# Patient Record
Sex: Male | Born: 1935 | Race: White | Hispanic: No | Marital: Married | Smoking: Never smoker
Health system: Southern US, Community
[De-identification: ages and names within clinical notes are randomized; demographics above are authoritative.]

## PROBLEM LIST (undated history)

## (undated) DIAGNOSIS — K219 Gastro-esophageal reflux disease without esophagitis: Secondary | ICD-10-CM

## (undated) DIAGNOSIS — C4492 Squamous cell carcinoma of skin, unspecified: Secondary | ICD-10-CM

## (undated) DIAGNOSIS — H409 Unspecified glaucoma: Secondary | ICD-10-CM

## (undated) DIAGNOSIS — F419 Anxiety disorder, unspecified: Secondary | ICD-10-CM

## (undated) DIAGNOSIS — I10 Essential (primary) hypertension: Secondary | ICD-10-CM

## (undated) DIAGNOSIS — G5600 Carpal tunnel syndrome, unspecified upper limb: Secondary | ICD-10-CM

## (undated) DIAGNOSIS — R238 Other skin changes: Secondary | ICD-10-CM

## (undated) DIAGNOSIS — E785 Hyperlipidemia, unspecified: Secondary | ICD-10-CM

## (undated) DIAGNOSIS — N3010 Interstitial cystitis (chronic) without hematuria: Secondary | ICD-10-CM

## (undated) DIAGNOSIS — D126 Benign neoplasm of colon, unspecified: Secondary | ICD-10-CM

## (undated) DIAGNOSIS — N400 Enlarged prostate without lower urinary tract symptoms: Secondary | ICD-10-CM

## (undated) DIAGNOSIS — M1990 Unspecified osteoarthritis, unspecified site: Secondary | ICD-10-CM

## (undated) HISTORY — DX: Benign prostatic hyperplasia without lower urinary tract symptoms: N40.0

## (undated) HISTORY — PX: UPPER GASTROINTESTINAL ENDOSCOPY: SHX188

## (undated) HISTORY — DX: Anxiety disorder, unspecified: F41.9

## (undated) HISTORY — DX: Hyperlipidemia, unspecified: E78.5

## (undated) HISTORY — PX: APPENDECTOMY: SHX54

## (undated) HISTORY — DX: Unspecified osteoarthritis, unspecified site: M19.90

## (undated) HISTORY — DX: Other skin changes: R23.8

## (undated) HISTORY — PX: TRANSURETHRAL RESECTION OF PROSTATE: SHX73

## (undated) HISTORY — DX: Squamous cell carcinoma of skin, unspecified: C44.92

## (undated) HISTORY — DX: Unspecified glaucoma: H40.9

## (undated) HISTORY — PX: POLYPECTOMY: SHX149

## (undated) HISTORY — PX: COLONOSCOPY: SHX174

## (undated) HISTORY — DX: Interstitial cystitis (chronic) without hematuria: N30.10

## (undated) HISTORY — DX: Essential (primary) hypertension: I10

## (undated) HISTORY — DX: Benign neoplasm of colon, unspecified: D12.6

## (undated) HISTORY — DX: Carpal tunnel syndrome, unspecified upper limb: G56.00

---

## 1998-12-01 ENCOUNTER — Observation Stay (HOSPITAL_COMMUNITY): Admission: EM | Admit: 1998-12-01 | Discharge: 1998-12-01 | Payer: Self-pay | Admitting: Emergency Medicine

## 1998-12-01 ENCOUNTER — Encounter: Payer: Self-pay | Admitting: Internal Medicine

## 2000-02-26 ENCOUNTER — Other Ambulatory Visit: Admission: RE | Admit: 2000-02-26 | Discharge: 2000-02-26 | Payer: Self-pay | Admitting: Gastroenterology

## 2000-02-26 ENCOUNTER — Encounter (INDEPENDENT_AMBULATORY_CARE_PROVIDER_SITE_OTHER): Payer: Self-pay | Admitting: Specialist

## 2003-05-19 ENCOUNTER — Encounter (INDEPENDENT_AMBULATORY_CARE_PROVIDER_SITE_OTHER): Payer: Self-pay | Admitting: *Deleted

## 2003-05-19 ENCOUNTER — Ambulatory Visit (HOSPITAL_BASED_OUTPATIENT_CLINIC_OR_DEPARTMENT_OTHER): Admission: RE | Admit: 2003-05-19 | Discharge: 2003-05-19 | Payer: Self-pay | Admitting: Urology

## 2004-12-10 ENCOUNTER — Ambulatory Visit: Payer: Self-pay | Admitting: Internal Medicine

## 2005-01-02 ENCOUNTER — Ambulatory Visit: Payer: Self-pay | Admitting: Internal Medicine

## 2005-01-31 ENCOUNTER — Ambulatory Visit (HOSPITAL_COMMUNITY): Admission: RE | Admit: 2005-01-31 | Discharge: 2005-01-31 | Payer: Self-pay | Admitting: Urology

## 2005-01-31 ENCOUNTER — Ambulatory Visit (HOSPITAL_BASED_OUTPATIENT_CLINIC_OR_DEPARTMENT_OTHER): Admission: RE | Admit: 2005-01-31 | Discharge: 2005-01-31 | Payer: Self-pay | Admitting: Urology

## 2005-02-11 ENCOUNTER — Ambulatory Visit: Payer: Self-pay | Admitting: Internal Medicine

## 2005-03-18 ENCOUNTER — Ambulatory Visit: Payer: Self-pay | Admitting: Internal Medicine

## 2005-05-09 ENCOUNTER — Ambulatory Visit: Payer: Self-pay | Admitting: Internal Medicine

## 2005-06-10 ENCOUNTER — Ambulatory Visit: Payer: Self-pay | Admitting: Internal Medicine

## 2005-06-13 ENCOUNTER — Ambulatory Visit: Payer: Self-pay | Admitting: Cardiology

## 2005-06-19 ENCOUNTER — Ambulatory Visit: Payer: Self-pay | Admitting: Internal Medicine

## 2005-08-21 ENCOUNTER — Ambulatory Visit: Payer: Self-pay | Admitting: Internal Medicine

## 2005-09-24 ENCOUNTER — Ambulatory Visit: Payer: Self-pay | Admitting: Internal Medicine

## 2005-10-02 ENCOUNTER — Ambulatory Visit: Payer: Self-pay | Admitting: Internal Medicine

## 2005-10-24 DIAGNOSIS — D126 Benign neoplasm of colon, unspecified: Secondary | ICD-10-CM

## 2005-10-24 HISTORY — DX: Benign neoplasm of colon, unspecified: D12.6

## 2005-10-27 ENCOUNTER — Ambulatory Visit: Payer: Self-pay | Admitting: Gastroenterology

## 2005-11-07 ENCOUNTER — Ambulatory Visit: Payer: Self-pay | Admitting: Gastroenterology

## 2005-11-07 ENCOUNTER — Encounter (INDEPENDENT_AMBULATORY_CARE_PROVIDER_SITE_OTHER): Payer: Self-pay | Admitting: Specialist

## 2005-11-07 LAB — HM COLONOSCOPY

## 2005-11-21 ENCOUNTER — Ambulatory Visit: Payer: Self-pay | Admitting: Cardiology

## 2006-01-01 ENCOUNTER — Ambulatory Visit: Payer: Self-pay | Admitting: Internal Medicine

## 2006-03-31 ENCOUNTER — Ambulatory Visit: Payer: Self-pay | Admitting: Internal Medicine

## 2006-06-08 ENCOUNTER — Ambulatory Visit: Payer: Self-pay | Admitting: Internal Medicine

## 2006-08-04 ENCOUNTER — Ambulatory Visit: Payer: Self-pay | Admitting: Internal Medicine

## 2006-10-05 ENCOUNTER — Ambulatory Visit: Payer: Self-pay | Admitting: Internal Medicine

## 2006-10-05 LAB — CONVERTED CEMR LAB
ALT: 26 units/L (ref 0–40)
AST: 24 units/L (ref 0–37)
Albumin: 3.7 g/dL (ref 3.5–5.2)
Alkaline Phosphatase: 51 units/L (ref 39–117)
BUN: 15 mg/dL (ref 6–23)
Basophils Absolute: 0 10*3/uL (ref 0.0–0.1)
Basophils Relative: 0.3 % (ref 0.0–1.0)
CO2: 31 meq/L (ref 19–32)
Calcium: 9.2 mg/dL (ref 8.4–10.5)
Chloride: 105 meq/L (ref 96–112)
Chol/HDL Ratio, serum: 3.9
Cholesterol: 166 mg/dL (ref 0–200)
Creatinine, Ser: 1.3 mg/dL (ref 0.4–1.5)
Eosinophil percent: 3.4 % (ref 0.0–5.0)
GFR calc non Af Amer: 58 mL/min
Glomerular Filtration Rate, Af Am: 70 mL/min/{1.73_m2}
Glucose, Bld: 101 mg/dL — ABNORMAL HIGH (ref 70–99)
HCT: 48.2 % (ref 39.0–52.0)
HDL: 42.4 mg/dL (ref >39.0)
Hemoglobin: 16.1 g/dL (ref 13.0–17.0)
LDL Cholesterol: 113 mg/dL — ABNORMAL HIGH (ref 0–99)
Lymphocytes Relative: 33.3 % (ref 12.0–46.0)
MCHC: 33.4 g/dL (ref 30.0–36.0)
MCV: 93.7 fL (ref 78.0–100.0)
Monocytes Absolute: 0.5 10*3/uL (ref 0.2–0.7)
Monocytes Relative: 7.1 % (ref 3.0–11.0)
Neutro Abs: 3.6 10*3/uL (ref 1.4–7.7)
Neutrophils Relative %: 55.9 % (ref 43.0–77.0)
PSA: 0.51 ng/mL (ref 0.10–4.00)
Platelets: 186 10*3/uL (ref 150–400)
Potassium: 4.9 meq/L (ref 3.5–5.1)
RBC: 5.14 M/uL (ref 4.22–5.81)
RDW: 12.4 % (ref 11.5–14.6)
Sodium: 141 meq/L (ref 135–145)
TSH: 1.15 microintl units/mL (ref 0.35–5.50)
Total Bilirubin: 1 mg/dL (ref 0.3–1.2)
Total Protein: 7.1 g/dL (ref 6.0–8.3)
Triglyceride fasting, serum: 55 mg/dL (ref 0–149)
VLDL: 11 mg/dL (ref 0–40)
WBC: 6.4 10*3/uL (ref 4.5–10.5)

## 2006-10-16 ENCOUNTER — Ambulatory Visit: Payer: Self-pay | Admitting: Internal Medicine

## 2007-01-12 ENCOUNTER — Ambulatory Visit (HOSPITAL_COMMUNITY): Admission: RE | Admit: 2007-01-12 | Discharge: 2007-01-12 | Payer: Self-pay | Admitting: Urology

## 2007-02-25 ENCOUNTER — Ambulatory Visit: Payer: Self-pay | Admitting: Internal Medicine

## 2007-05-10 ENCOUNTER — Ambulatory Visit: Payer: Self-pay | Admitting: Internal Medicine

## 2007-09-28 ENCOUNTER — Ambulatory Visit: Payer: Self-pay | Admitting: Internal Medicine

## 2007-09-28 DIAGNOSIS — M1990 Unspecified osteoarthritis, unspecified site: Secondary | ICD-10-CM | POA: Insufficient documentation

## 2007-09-28 DIAGNOSIS — J309 Allergic rhinitis, unspecified: Secondary | ICD-10-CM

## 2007-09-28 DIAGNOSIS — I10 Essential (primary) hypertension: Secondary | ICD-10-CM | POA: Insufficient documentation

## 2007-09-28 DIAGNOSIS — N3010 Interstitial cystitis (chronic) without hematuria: Secondary | ICD-10-CM | POA: Insufficient documentation

## 2007-09-28 DIAGNOSIS — E785 Hyperlipidemia, unspecified: Secondary | ICD-10-CM

## 2007-09-28 DIAGNOSIS — J328 Other chronic sinusitis: Secondary | ICD-10-CM | POA: Insufficient documentation

## 2007-09-28 HISTORY — DX: Allergic rhinitis, unspecified: J30.9

## 2007-09-28 HISTORY — DX: Essential (primary) hypertension: I10

## 2007-09-28 HISTORY — DX: Other chronic sinusitis: J32.8

## 2007-09-28 HISTORY — DX: Unspecified osteoarthritis, unspecified site: M19.90

## 2007-09-28 LAB — CONVERTED CEMR LAB
ALT: 27 units/L (ref 0–53)
AST: 22 units/L (ref 0–37)
Albumin: 4.1 g/dL (ref 3.5–5.2)
Alkaline Phosphatase: 52 units/L (ref 39–117)
BUN: 13 mg/dL (ref 6–23)
Basophils Absolute: 0 10*3/uL (ref 0.0–0.1)
Basophils Relative: 0 % (ref 0.0–1.0)
Bilirubin, Direct: 0.1 mg/dL (ref 0.0–0.3)
CO2: 30 meq/L (ref 19–32)
Calcium: 9.4 mg/dL (ref 8.4–10.5)
Chloride: 100 meq/L (ref 96–112)
Cholesterol, target level: 200 mg/dL
Cholesterol: 202 mg/dL (ref 0–200)
Creatinine, Ser: 1 mg/dL (ref 0.4–1.5)
Direct LDL: 144.4 mg/dL
Eosinophils Absolute: 0.2 10*3/uL (ref 0.0–0.6)
Eosinophils Relative: 3.1 % (ref 0.0–5.0)
GFR calc Af Amer: 95 mL/min
GFR calc non Af Amer: 79 mL/min
Glucose, Bld: 85 mg/dL (ref 70–99)
HCT: 48 % (ref 39.0–52.0)
HDL goal, serum: 40 mg/dL
HDL: 40.3 mg/dL (ref >39.0)
Hemoglobin: 16.3 g/dL (ref 13.0–17.0)
LDL Goal: 100 mg/dL
Lymphocytes Relative: 33.7 % (ref 12.0–46.0)
MCHC: 34 g/dL (ref 30.0–36.0)
MCV: 92.9 fL (ref 78.0–100.0)
Monocytes Absolute: 0.5 10*3/uL (ref 0.2–0.7)
Monocytes Relative: 7.4 % (ref 3.0–11.0)
Neutro Abs: 4 10*3/uL (ref 1.4–7.7)
Neutrophils Relative %: 55.8 % (ref 43.0–77.0)
Platelets: 208 10*3/uL (ref 150–400)
Potassium: 4.2 meq/L (ref 3.5–5.1)
RBC: 5.17 M/uL (ref 4.22–5.81)
RDW: 12.1 % (ref 11.5–14.6)
Sodium: 138 meq/L (ref 135–145)
TSH: 1.86 microintl units/mL (ref 0.35–5.50)
Total Bilirubin: 1.1 mg/dL (ref 0.3–1.2)
Total CHOL/HDL Ratio: 5
Total Protein: 7.1 g/dL (ref 6.0–8.3)
Triglycerides: 95 mg/dL (ref 0–149)
VLDL: 19 mg/dL (ref 0–40)
WBC: 7.1 10*3/uL (ref 4.5–10.5)

## 2008-02-11 ENCOUNTER — Ambulatory Visit: Payer: Self-pay | Admitting: Internal Medicine

## 2008-02-11 DIAGNOSIS — S60459A Superficial foreign body of unspecified finger, initial encounter: Secondary | ICD-10-CM | POA: Insufficient documentation

## 2008-02-11 DIAGNOSIS — R079 Chest pain, unspecified: Secondary | ICD-10-CM | POA: Insufficient documentation

## 2008-02-11 HISTORY — DX: Superficial foreign body of unspecified finger, initial encounter: S60.459A

## 2008-02-21 ENCOUNTER — Encounter: Payer: Self-pay | Admitting: Internal Medicine

## 2008-02-21 ENCOUNTER — Ambulatory Visit: Payer: Self-pay

## 2008-03-30 ENCOUNTER — Telehealth: Payer: Self-pay | Admitting: Internal Medicine

## 2008-04-04 ENCOUNTER — Ambulatory Visit: Payer: Self-pay | Admitting: Internal Medicine

## 2008-07-07 ENCOUNTER — Ambulatory Visit: Payer: Self-pay | Admitting: Internal Medicine

## 2008-08-24 ENCOUNTER — Ambulatory Visit: Payer: Self-pay | Admitting: Internal Medicine

## 2008-10-24 ENCOUNTER — Ambulatory Visit: Payer: Self-pay | Admitting: Internal Medicine

## 2009-03-26 ENCOUNTER — Ambulatory Visit: Payer: Self-pay | Admitting: Internal Medicine

## 2009-03-26 DIAGNOSIS — N411 Chronic prostatitis: Secondary | ICD-10-CM | POA: Insufficient documentation

## 2009-03-26 DIAGNOSIS — N410 Acute prostatitis: Secondary | ICD-10-CM

## 2009-03-26 HISTORY — DX: Acute prostatitis: N41.0

## 2009-04-24 ENCOUNTER — Ambulatory Visit: Payer: Self-pay | Admitting: Internal Medicine

## 2009-05-01 ENCOUNTER — Encounter: Payer: Self-pay | Admitting: Internal Medicine

## 2009-07-03 ENCOUNTER — Ambulatory Visit: Payer: Self-pay | Admitting: Internal Medicine

## 2009-07-04 ENCOUNTER — Encounter: Payer: Self-pay | Admitting: Internal Medicine

## 2009-08-28 ENCOUNTER — Ambulatory Visit: Payer: Self-pay | Admitting: Internal Medicine

## 2009-08-28 DIAGNOSIS — L570 Actinic keratosis: Secondary | ICD-10-CM | POA: Insufficient documentation

## 2009-08-28 HISTORY — DX: Actinic keratosis: L57.0

## 2009-10-22 ENCOUNTER — Ambulatory Visit: Payer: Self-pay | Admitting: Family Medicine

## 2009-10-30 ENCOUNTER — Ambulatory Visit: Payer: Self-pay | Admitting: Internal Medicine

## 2009-10-30 LAB — CONVERTED CEMR LAB
ALT: 34 units/L (ref 0–53)
AST: 25 units/L (ref 0–37)
Basophils Relative: 0.1 % (ref 0.0–3.0)
Bilirubin Urine: NEGATIVE
Chloride: 104 meq/L (ref 96–112)
Eosinophils Relative: 3 % (ref 0.0–5.0)
GFR calc non Af Amer: 63.07 mL/min (ref >60)
HCT: 48 % (ref 39.0–52.0)
Hemoglobin: 16.1 g/dL (ref 13.0–17.0)
LDL Cholesterol: 119 mg/dL — ABNORMAL HIGH (ref 0–99)
Lymphs Abs: 2.5 10*3/uL (ref 0.7–4.0)
MCV: 95.4 fL (ref 78.0–100.0)
Monocytes Absolute: 0.5 10*3/uL (ref 0.1–1.0)
Monocytes Relative: 7.4 % (ref 3.0–12.0)
Neutro Abs: 3.7 10*3/uL (ref 1.4–7.7)
Platelets: 196 10*3/uL (ref 150.0–400.0)
Potassium: 3.9 meq/L (ref 3.5–5.1)
Protein, U semiquant: NEGATIVE
RBC: 5.03 M/uL (ref 4.22–5.81)
Sodium: 140 meq/L (ref 135–145)
TSH: 1.68 microintl units/mL (ref 0.35–5.50)
Total Bilirubin: 0.8 mg/dL (ref 0.3–1.2)
Total CHOL/HDL Ratio: 5
Total Protein: 7.4 g/dL (ref 6.0–8.3)
Urobilinogen, UA: 0.2
VLDL: 23 mg/dL (ref 0.0–40.0)
WBC Urine, dipstick: NEGATIVE
WBC: 6.9 10*3/uL (ref 4.5–10.5)

## 2009-11-06 ENCOUNTER — Ambulatory Visit: Payer: Self-pay | Admitting: Internal Medicine

## 2009-11-13 ENCOUNTER — Encounter: Payer: Self-pay | Admitting: Internal Medicine

## 2010-03-06 ENCOUNTER — Ambulatory Visit: Payer: Self-pay | Admitting: Internal Medicine

## 2010-06-05 ENCOUNTER — Ambulatory Visit: Payer: Self-pay | Admitting: Internal Medicine

## 2010-10-02 ENCOUNTER — Ambulatory Visit: Payer: Self-pay | Admitting: Internal Medicine

## 2010-10-16 DIAGNOSIS — M720 Palmar fascial fibromatosis [Dupuytren]: Secondary | ICD-10-CM | POA: Insufficient documentation

## 2010-10-16 HISTORY — DX: Palmar fascial fibromatosis (dupuytren): M72.0

## 2010-11-05 ENCOUNTER — Encounter: Payer: Self-pay | Admitting: Gastroenterology

## 2010-12-15 ENCOUNTER — Encounter: Payer: Self-pay | Admitting: Internal Medicine

## 2010-12-22 LAB — CONVERTED CEMR LAB
AST: 23 units/L (ref 0–37)
Albumin: 3.8 g/dL (ref 3.5–5.2)
Alkaline Phosphatase: 59 units/L (ref 39–117)
BUN: 12 mg/dL (ref 6–23)
Basophils Relative: 0 % (ref 0.0–3.0)
Chloride: 103 meq/L (ref 96–112)
Creatinine, Ser: 1.1 mg/dL (ref 0.4–1.5)
Eosinophils Absolute: 0.2 10*3/uL (ref 0.0–0.7)
Eosinophils Relative: 2.2 % (ref 0.0–5.0)
GFR calc Af Amer: 85 mL/min
GFR calc non Af Amer: 70 mL/min
Glucose, Bld: 81 mg/dL (ref 70–99)
HCT: 47.6 % (ref 39.0–52.0)
Hemoglobin: 16.6 g/dL (ref 13.0–17.0)
MCV: 93 fL (ref 78.0–100.0)
Monocytes Absolute: 0.5 10*3/uL (ref 0.1–1.0)
Monocytes Relative: 6.7 % (ref 3.0–12.0)
PSA: 0.5 ng/mL (ref 0.10–4.00)
Potassium: 4.5 meq/L (ref 3.5–5.1)
RBC: 5.12 M/uL (ref 4.22–5.81)
Total CHOL/HDL Ratio: 3.9
Total Protein: 7.4 g/dL (ref 6.0–8.3)
WBC: 8.1 10*3/uL (ref 4.5–10.5)

## 2010-12-24 ENCOUNTER — Ambulatory Visit
Admission: RE | Admit: 2010-12-24 | Discharge: 2010-12-24 | Payer: Self-pay | Source: Home / Self Care | Attending: Internal Medicine | Admitting: Internal Medicine

## 2010-12-24 DIAGNOSIS — IMO0002 Reserved for concepts with insufficient information to code with codable children: Secondary | ICD-10-CM | POA: Insufficient documentation

## 2010-12-24 NOTE — Assessment & Plan Note (Signed)
Summary: 3 month rov/njr   Vital Signs:  Patient profile:   77 year old male Height:      71 inches Weight:      217 pounds BMI:     30.37 Temp:     98.2 degrees F oral Pulse rate:   68 / minute Resp:     14 per minute BP sitting:   124 / 76  (left arm)  Vitals Entered By: Willy Eddy, LPN (June 05, 2010 9:13 AM) CC: roa, Hypertension Management   CC:  roa and Hypertension Management.  History of Present Illness: presents for follow up of htn and gerd stable on medications allergiy flair noted no chest pains or sob tolerating meds well gerd is contolled as long as takes medication  Hypertension History:      He denies headache, chest pain, palpitations, dyspnea with exertion, orthopnea, PND, peripheral edema, visual symptoms, neurologic problems, syncope, and side effects from treatment.        Positive major cardiovascular risk factors include male age 49 years old or older, hyperlipidemia, and hypertension.  Negative major cardiovascular risk factors include negative family history for ischemic heart disease and non-tobacco-user status.        Further assessment for target organ damage reveals no history of ASHD, stroke/TIA, or peripheral vascular disease.     Preventive Screening-Counseling & Management  Alcohol-Tobacco     Smoking Status: never     Passive Smoke Exposure: no  Problems Prior to Update: 1)  Actinic Keratosis, Cheek, Left  (ICD-702.0) 2)  Prostatitis, Acute, Chronic  (ICD-601.0) 3)  Chest Pain Unspecified  (ICD-786.50) 4)  Finger Sup Fb w/o Maj Open Wound&w/o Mention Inf  (ICD-915.6) 5)  Preventive Health Care  (ICD-V70.0) 6)  Osteoarthritis  (ICD-715.90) 7)  Allergic Rhinitis  (ICD-477.9) 8)  Cystitis, Chronic Interstitial  (ICD-595.1) 9)  Other Chronic Sinusitis  (ICD-473.8) 10)  Hypertension  (ICD-401.9) 11)  Hyperlipidemia  (ICD-272.4)  Current Problems (verified): 1)  Actinic Keratosis, Cheek, Left  (ICD-702.0) 2)  Prostatitis,  Acute, Chronic  (ICD-601.0) 3)  Chest Pain Unspecified  (ICD-786.50) 4)  Finger Sup Fb w/o Maj Open Wound&w/o Mention Inf  (ICD-915.6) 5)  Preventive Health Care  (ICD-V70.0) 6)  Osteoarthritis  (ICD-715.90) 7)  Allergic Rhinitis  (ICD-477.9) 8)  Cystitis, Chronic Interstitial  (ICD-595.1) 9)  Other Chronic Sinusitis  (ICD-473.8) 10)  Hypertension  (ICD-401.9) 11)  Hyperlipidemia  (ICD-272.4)  Medications Prior to Update: 1)  Xanax 0.25 Mg Tabs (Alprazolam) .... Every 8 Hrs As Needed 2)  Diovan Hct 160-12.5 Mg  Tabs (Valsartan-Hydrochlorothiazide) .... Once Daily 3)  Crestor 5 Mg  Tabs (Rosuvastatin Calcium) .... Once Daily 4)  Rapaflo 4 Mg Caps (Silodosin) .... One By Mouth Daily 5)  Avodart 0.5 Mg Caps (Dutasteride) .... One By Mouth Daily 6)  Uribel 118 Mg Caps (Meth-Hyo-M Bl-Na Phos-Ph Sal) .Marland Kitchen.. 1 Once Daily  Current Medications (verified): 1)  Xanax 0.25 Mg Tabs (Alprazolam) .... Every 8 Hrs As Needed 2)  Diovan Hct 160-12.5 Mg  Tabs (Valsartan-Hydrochlorothiazide) .... Once Daily 3)  Crestor 5 Mg  Tabs (Rosuvastatin Calcium) .... Once Daily 4)  Rapaflo 4 Mg Caps (Silodosin) .... One By Mouth Daily 5)  Avodart 0.5 Mg Caps (Dutasteride) .... One By Mouth Daily 6)  Uribel 118 Mg Caps (Meth-Hyo-M Bl-Na Phos-Ph Sal) .Marland Kitchen.. 1 Once Daily  Allergies (verified): No Known Drug Allergies  Past History:  Family History: Last updated: 09/28/2007 Family History of Anxiety Family History High cholesterol Family  History Hypertension  Social History: Last updated: 09/28/2007 Married Former Smoker Alcohol use-yes Drug use-no  Risk Factors: Smoking Status: never (06/05/2010) Passive Smoke Exposure: no (06/05/2010)  Past medical, surgical, family and social histories (including risk factors) reviewed, and no changes noted (except as noted below).  Past Medical History: Reviewed history from 09/28/2007 and no changes required. Hyperlipidemia Hypertension chronic sinusitis  473.8 chronic interstitial cystiti 595.1 Allergic rhinitis Osteoarthritis  Past Surgical History: Reviewed history from 09/28/2007 and no changes required. cd bxdeystoscpy bla Transurethral resection of prostate  Family History: Reviewed history from 09/28/2007 and no changes required. Family History of Anxiety Family History High cholesterol Family History Hypertension  Social History: Reviewed history from 09/28/2007 and no changes required. Married Former Smoker Alcohol use-yes Drug use-no  Review of Systems  The patient denies anorexia, fever, weight loss, weight gain, vision loss, decreased hearing, hoarseness, chest pain, syncope, dyspnea on exertion, peripheral edema, prolonged cough, headaches, hemoptysis, abdominal pain, melena, hematochezia, severe indigestion/heartburn, hematuria, incontinence, genital sores, muscle weakness, suspicious skin lesions, transient blindness, difficulty walking, depression, unusual weight change, abnormal bleeding, enlarged lymph nodes, angioedema, and breast masses.    Physical Exam  General:  Well-developed,well-nourished,in no acute distress; alert,appropriate and cooperative throughout examination Head:  normocephalic and male-pattern balding.   Eyes:  pupils equal, pupils round, and pupils reactive to light.   Ears:  R ear normal and L ear normal.   Nose:  minimal mostly clear nasal mucus Mouth:  good dentition and pharynx pink and moist.   Neck:  supple with minimally tender anterior cervical nodes bilaterally Lungs:  Normal respiratory effort, chest expands symmetrically. Lungs are clear to auscultation, no crackles or wheezes. Heart:  normal rate and regular rhythm.   Abdomen:  soft and non-tender.   Rectal:  no external abnormalities and external hemorrhoid(s).   Prostate:  no nodules, no asymmetry, and 1+ enlarged.     Impression & Recommendations:  Problem # 1:  HYPERTENSION (ICD-401.9) Assessment Improved  His  updated medication list for this problem includes:    Diovan Hct 160-12.5 Mg Tabs (Valsartan-hydrochlorothiazide) ..... Once daily  BP today: 124/76 Prior BP: 120/78 (03/06/2010)  Prior 10 Yr Risk Heart Disease: 27 % (03/06/2010)  Labs Reviewed: K+: 3.9 (10/30/2009) Creat: : 1.2 (10/30/2009)   Chol: 176 (10/30/2009)   HDL: 34.10 (10/30/2009)   LDL: 119 (10/30/2009)   TG: 115.0 (10/30/2009)  Problem # 2:  OSTEOARTHRITIS (ICD-715.90) stable Discussed use of medications, application of heat or cold, and exercises.   Problem # 3:  OTHER CHRONIC SINUSITIS (ICD-473.8) stable Take antibiotics for full duration. Discussed treatment options including indications for coronal CT scan of sinuses and ENT referral.   Problem # 4:  CYSTITIS, CHRONIC INTERSTITIAL (ICD-595.1) Assessment: Unchanged stable with medicaton with the urorobel and has seen the urologist Dr Logan Bores saw him June 9 had cystoscopy and prostate check  Problem # 5:  PREVENTIVE HEALTH CARE (ICD-V70.0) monitering  Colonoscopy: Adenomatous Polyp (11/07/2005) Td Booster: Historical (09/24/2006)   Flu Vax: Historical (08/24/2009)   Pneumovax: Historical (09/25/2003) Chol: 176 (10/30/2009)   HDL: 34.10 (10/30/2009)   LDL: 119 (10/30/2009)   TG: 115.0 (10/30/2009) TSH: 1.68 (10/30/2009)   PSA: 0.62 (10/30/2009) Next Colonoscopy due:: 10/2010 (10/24/2008)  Discussed using sunscreen, use of alcohol, drug use, self testicular exam, routine dental care, routine eye care, routine physical exam, seat belts, multiple vitamins, osteoporosis prevention, adequate calcium intake in diet, and recommendations for immunizations.  Discussed exercise and checking cholesterol.  Discussed gun safety, safe  sex, and contraception. Also recommend checking PSA.  Complete Medication List: 1)  Xanax 0.25 Mg Tabs (Alprazolam) .... Every 8 hrs as needed 2)  Diovan Hct 160-12.5 Mg Tabs (Valsartan-hydrochlorothiazide) .... Once daily 3)  Crestor 5 Mg Tabs  (Rosuvastatin calcium) .... Once daily 4)  Rapaflo 4 Mg Caps (Silodosin) .... One by mouth daily 5)  Avodart 0.5 Mg Caps (Dutasteride) .... One by mouth daily 6)  Uribel 118 Mg Caps (Meth-hyo-m bl-na phos-ph sal) .Marland Kitchen.. 1 once daily  Hypertension Assessment/Plan:      The patient's hypertensive risk group is category B: At least one risk factor (excluding diabetes) with no target organ damage.  His calculated 10 year risk of coronary heart disease is 27 %.  Today's blood pressure is 124/76.  His blood pressure goal is < 140/90.  Patient Instructions: 1)  Please schedule a follow-up appointment in 4 months. Prescriptions: DIOVAN HCT 160-12.5 MG  TABS (VALSARTAN-HYDROCHLOROTHIAZIDE) once daily  #30 x 3   Entered and Authorized by:   Stacie Glaze MD   Signed by:   Stacie Glaze MD on 06/05/2010   Method used:   Electronically to        [facility]  [phone]* (retail)       [address]       [place], [place]  [address]       Ph: [phone] or [phone]       Fax: [phone]   RxID:   [id number]

## 2010-12-24 NOTE — Assessment & Plan Note (Signed)
Summary: 4 month rov/njr   Vital Signs:  Patient profile:   77 year old male Height:      71 inches Weight:      216 pounds Temp:     98.2 degrees F oral Pulse rate:   68 / minute Resp:     14 per minute BP sitting:   120 / 78  (left arm)  Vitals Entered By: Willy Eddy, LPN (March 06, 2010 8:53 AM) CC: roa, Hypertension Management, Lipid Management   CC:  roa, Hypertension Management, and Lipid Management.  History of Present Illness: blood pressure is good the bladder problems are increased the uribel helped but it is too expensive wine seems to increase the IC his lipids are good   Hypertension History:      He denies headache, chest pain, palpitations, dyspnea with exertion, orthopnea, PND, peripheral edema, visual symptoms, neurologic problems, syncope, and side effects from treatment.        Positive major cardiovascular risk factors include male age 43 years old or older, hyperlipidemia, and hypertension.  Negative major cardiovascular risk factors include negative family history for ischemic heart disease and non-tobacco-user status.        Further assessment for target organ damage reveals no history of ASHD, stroke/TIA, or peripheral vascular disease.    Lipid Management History:      Positive NCEP/ATP III risk factors include male age 62 years old or older, HDL cholesterol less than 40, and hypertension.  Negative NCEP/ATP III risk factors include no family history for ischemic heart disease, non-tobacco-user status, no ASHD (atherosclerotic heart disease), no prior stroke/TIA, no peripheral vascular disease, and no history of aortic aneurysm.      Preventive Screening-Counseling & Management  Alcohol-Tobacco     Smoking Status: never     Passive Smoke Exposure: no  Problems Prior to Update: 1)  Actinic Keratosis, Cheek, Left  (ICD-702.0) 2)  Prostatitis, Acute, Chronic  (ICD-601.0) 3)  Chest Pain Unspecified  (ICD-786.50) 4)  Finger Sup Fb w/o Maj Open  Wound&w/o Mention Inf  (ICD-915.6) 5)  Preventive Health Care  (ICD-V70.0) 6)  Osteoarthritis  (ICD-715.90) 7)  Allergic Rhinitis  (ICD-477.9) 8)  Cystitis, Chronic Interstitial  (ICD-595.1) 9)  Other Chronic Sinusitis  (ICD-473.8) 10)  Hypertension  (ICD-401.9) 11)  Hyperlipidemia  (ICD-272.4)  Current Problems (verified): 1)  Actinic Keratosis, Cheek, Left  (ICD-702.0) 2)  Prostatitis, Acute, Chronic  (ICD-601.0) 3)  Chest Pain Unspecified  (ICD-786.50) 4)  Finger Sup Fb w/o Maj Open Wound&w/o Mention Inf  (ICD-915.6) 5)  Preventive Health Care  (ICD-V70.0) 6)  Osteoarthritis  (ICD-715.90) 7)  Allergic Rhinitis  (ICD-477.9) 8)  Cystitis, Chronic Interstitial  (ICD-595.1) 9)  Other Chronic Sinusitis  (ICD-473.8) 10)  Hypertension  (ICD-401.9) 11)  Hyperlipidemia  (ICD-272.4)  Medications Prior to Update: 1)  Xanax 0.25 Mg Tabs (Alprazolam) .... Every 8 Hrs As Needed 2)  Diovan Hct 160-12.5 Mg  Tabs (Valsartan-Hydrochlorothiazide) .... Once Daily 3)  Crestor 5 Mg  Tabs (Rosuvastatin Calcium) .... Once Daily 4)  Rapaflo 4 Mg Caps (Silodosin) .... One By Mouth Daily 5)  Avodart 0.5 Mg Caps (Dutasteride) .... One By Mouth Daily 6)  Veramyst 27.5 Mcg/spray Susp (Fluticasone Furoate) .... Two Sprays in Nostril Two Times A Day For A Week Then Daily Throught The Fall 7)  Azithromycin 250 Mg Tabs (Azithromycin) .... 2 By Mouth Today Then One By Mouth Once Daily For 4 Days 8)  Uribel 118 Mg Caps (Meth-Hyo-M Bl-Na Phos-Ph Sal) .Marland KitchenMarland KitchenMarland Kitchen  1 Once Daily  Current Medications (verified): 1)  Xanax 0.25 Mg Tabs (Alprazolam) .... Every 8 Hrs As Needed 2)  Diovan Hct 160-12.5 Mg  Tabs (Valsartan-Hydrochlorothiazide) .... Once Daily 3)  Crestor 5 Mg  Tabs (Rosuvastatin Calcium) .... Once Daily 4)  Rapaflo 4 Mg Caps (Silodosin) .... One By Mouth Daily 5)  Avodart 0.5 Mg Caps (Dutasteride) .... One By Mouth Daily 6)  Uribel 118 Mg Caps (Meth-Hyo-M Bl-Na Phos-Ph Sal) .Marland Kitchen.. 1 Once Daily  Allergies  (verified): No Known Drug Allergies  Past History:  Family History: Last updated: 09/28/2007 Family History of Anxiety Family History High cholesterol Family History Hypertension  Social History: Last updated: 09/28/2007 Married Former Smoker Alcohol use-yes Drug use-no  Risk Factors: Smoking Status: never (03/06/2010) Passive Smoke Exposure: no (03/06/2010)  Past medical, surgical, family and social histories (including risk factors) reviewed, and no changes noted (except as noted below).  Past Medical History: Reviewed history from 09/28/2007 and no changes required. Hyperlipidemia Hypertension chronic sinusitis 473.8 chronic interstitial cystiti 595.1 Allergic rhinitis Osteoarthritis  Past Surgical History: Reviewed history from 09/28/2007 and no changes required. cd bxdeystoscpy bla Transurethral resection of prostate  Family History: Reviewed history from 09/28/2007 and no changes required. Family History of Anxiety Family History High cholesterol Family History Hypertension  Social History: Reviewed history from 09/28/2007 and no changes required. Married Former Smoker Alcohol use-yes Drug use-no  Review of Systems  The patient denies anorexia, fever, weight loss, weight gain, vision loss, decreased hearing, hoarseness, chest pain, syncope, dyspnea on exertion, peripheral edema, prolonged cough, headaches, hemoptysis, abdominal pain, melena, hematochezia, severe indigestion/heartburn, hematuria, incontinence, genital sores, muscle weakness, suspicious skin lesions, transient blindness, difficulty walking, depression, unusual weight change, abnormal bleeding, enlarged lymph nodes, angioedema, and breast masses.    Physical Exam  General:  Well-developed,well-nourished,in no acute distress; alert,appropriate and cooperative throughout examination Head:  normocephalic and male-pattern balding.   Eyes:  pupils equal, pupils round, and pupils reactive to  light.   Ears:  R ear normal and L ear normal.   Nose:  minimal mostly clear nasal mucus Mouth:  good dentition and pharynx pink and moist.   Neck:  supple with minimally tender anterior cervical nodes bilaterally Lungs:  Normal respiratory effort, chest expands symmetrically. Lungs are clear to auscultation, no crackles or wheezes. Heart:  normal rate and regular rhythm.   Abdomen:  soft and non-tender.   Neurologic:  alert & oriented X3 and finger-to-nose normal.     Impression & Recommendations:  Problem # 1:  CYSTITIS, CHRONIC INTERSTITIAL (ICD-595.1)  urobell trial  Orders: Prescription Created Electronically [phone])  Problem # 2:  HYPERTENSION (ICD-401.9) stable His updated medication list for this problem includes:    Diovan Hct 160-12.5 Mg Tabs (Valsartan-hydrochlorothiazide) ..... Once daily  BP today: 120/78 Prior BP: 126/76 (11/06/2009)  10 Yr Risk Heart Disease: 27 % Prior 10 Yr Risk Heart Disease: 22 % (07/03/2009)  Labs Reviewed: K+: 3.9 (10/30/2009) Creat: : 1.2 (10/30/2009)   Chol: 176 (10/30/2009)   HDL: 34.10 (10/30/2009)   LDL: 119 (10/30/2009)   TG: 115.0 (10/30/2009)  Problem # 3:  HYPERLIPIDEMIA (ICD-272.4)  His updated medication list for this problem includes:    Crestor 5 Mg Tabs (Rosuvastatin calcium) ..... Once daily  Labs Reviewed: SGOT: 25 (10/30/2009)   SGPT: 34 (10/30/2009)  Lipid Goals: Chol Goal: 200 (09/28/2007)   HDL Goal: 40 (09/28/2007)   LDL Goal: 100 (09/28/2007)   TG Goal: 150 (09/28/2007)  10 Yr Risk Heart Disease: 27 %  Prior 10 Yr Risk Heart Disease: 22 % (07/03/2009)   HDL:34.10 (10/30/2009), 43.2 (10/24/2008)  LDL:119 (10/30/2009), 107 (10/24/2008)  Chol:176 (10/30/2009), 169 (10/24/2008)  Trig:115.0 (10/30/2009), 94 (10/24/2008)  Problem # 4:  OTHER CHRONIC SINUSITIS (ICD-473.8)  The following medications were removed from the medication list:    Veramyst 27.5 Mcg/spray Susp (Fluticasone furoate) .Marland Kitchen..Marland Kitchen Two sprays in  nostril two times a day for a week then daily throught the fall    Azithromycin 250 Mg Tabs (Azithromycin) .Marland Kitchen... 2 by mouth today then one by mouth once daily for 4 days  Take antibiotics for full duration. Discussed treatment options including indications for coronal CT scan of sinuses and ENT referral.   Complete Medication List: 1)  Xanax 0.25 Mg Tabs (Alprazolam) .... Every 8 hrs as needed 2)  Diovan Hct 160-12.5 Mg Tabs (Valsartan-hydrochlorothiazide) .... Once daily 3)  Crestor 5 Mg Tabs (Rosuvastatin calcium) .... Once daily 4)  Rapaflo 4 Mg Caps (Silodosin) .... One by mouth daily 5)  Avodart 0.5 Mg Caps (Dutasteride) .... One by mouth daily 6)  Uribel 118 Mg Caps (Meth-hyo-m bl-na phos-ph sal) .Marland Kitchen.. 1 once daily  Hypertension Assessment/Plan:      The patient's hypertensive risk group is category B: At least one risk factor (excluding diabetes) with no target organ damage.  His calculated 10 year risk of coronary heart disease is 27 %.  Today's blood pressure is 120/78.  His blood pressure goal is < 140/90.  Lipid Assessment/Plan:      Based on NCEP/ATP III, the patient's risk factor category is "2 or more risk factors and a calculated 10 year CAD risk of > 20%".  The patient's lipid goals are as follows: Total cholesterol goal is 200; LDL cholesterol goal is 100; HDL cholesterol goal is 40; Triglyceride goal is 150.    Patient Instructions: 1)  Please schedule a follow-up appointment in 3 months. Prescriptions: URIBEL 118 MG CAPS (METH-HYO-M BL-NA PHOS-PH SAL) 1 once daily  #30 x 11   Entered and Authorized by:   Stacie Glaze MD   Signed by:   Stacie Glaze MD on 03/06/2010   Method used:   Electronically to        [facility]* (retail)       [address]       [place], [place]  [id number]       Ph: [phone]       Fax: [phone]   RxID:   [id number] Prudy Feeler 0.25 MG TABS (ALPRAZOLAM) every 8 hrs as needed  #90 x 3   Entered by:   Willy Eddy,  LPN   Authorized by:   Stacie Glaze MD   Signed by:   Willy Eddy, LPN on 23/53/6144   Method used:   Telephoned to ...       [facility]  [address]  [phone]* (retail)       [address]       [place], [place]  [address]       Ph: [phone] or [phone]       Fax: [phone]   RxID:   [id number]

## 2010-12-24 NOTE — Assessment & Plan Note (Signed)
Summary: roa/bmw   Vital Signs:  Patient profile:   77 year old male Height:      71 inches Weight:      220 pounds BMI:     30.79 Temp:     98.2 degrees F oral Pulse rate:   68 / minute Resp:     14 per minute BP sitting:   136 / 80  (left arm)  Vitals Entered By: Willy Eddy, LPN (October 02, 2010 4:03 PM) CC: c/o synovial type cyst in palm of hand, Hypertension Management Is Patient Diabetic? No   Visit Type:  sclerosis in the tendons in hand consid= Primary Care Provider:  Stacie Glaze MD  CC:  c/o synovial type cyst in palm of hand and Hypertension Management.  History of Present Illness: sclerosis in the tendons in the hand consitant with dupytrens contractures this is effecting grip and has intermitant trigger finger  Hypertension History:      He denies headache, chest pain, palpitations, dyspnea with exertion, orthopnea, PND, peripheral edema, visual symptoms, neurologic problems, syncope, and side effects from treatment.        Positive major cardiovascular risk factors include male age 5 years old or older, hyperlipidemia, and hypertension.  Negative major cardiovascular risk factors include negative family history for ischemic heart disease and non-tobacco-user status.        Further assessment for target organ damage reveals no history of ASHD, stroke/TIA, or peripheral vascular disease.     Preventive Screening-Counseling & Management  Alcohol-Tobacco     Smoking Status: never     Passive Smoke Exposure: no  Problems Prior to Update: 1)  Actinic Keratosis, Cheek, Left  (ICD-702.0) 2)  Prostatitis, Acute, Chronic  (ICD-601.0) 3)  Chest Pain Unspecified  (ICD-786.50) 4)  Finger Sup Fb w/o Maj Open Wound&w/o Mention Inf  (ICD-915.6) 5)  Preventive Health Care  (ICD-V70.0) 6)  Osteoarthritis  (ICD-715.90) 7)  Allergic Rhinitis  (ICD-477.9) 8)  Cystitis, Chronic Interstitial  (ICD-595.1) 9)  Other Chronic Sinusitis  (ICD-473.8) 10)  Hypertension   (ICD-401.9) 11)  Hyperlipidemia  (ICD-272.4)  Current Problems (verified): 1)  Actinic Keratosis, Cheek, Left  (ICD-702.0) 2)  Prostatitis, Acute, Chronic  (ICD-601.0) 3)  Chest Pain Unspecified  (ICD-786.50) 4)  Finger Sup Fb w/o Maj Open Wound&w/o Mention Inf  (ICD-915.6) 5)  Preventive Health Care  (ICD-V70.0) 6)  Osteoarthritis  (ICD-715.90) 7)  Allergic Rhinitis  (ICD-477.9) 8)  Cystitis, Chronic Interstitial  (ICD-595.1) 9)  Other Chronic Sinusitis  (ICD-473.8) 10)  Hypertension  (ICD-401.9) 11)  Hyperlipidemia  (ICD-272.4)  Medications Prior to Update: 1)  Xanax 0.25 Mg Tabs (Alprazolam) .... Every 8 Hrs As Needed 2)  Diovan Hct 160-12.5 Mg  Tabs (Valsartan-Hydrochlorothiazide) .... Once Daily 3)  Crestor 5 Mg  Tabs (Rosuvastatin Calcium) .... Once Daily 4)  Rapaflo 4 Mg Caps (Silodosin) .... One By Mouth Daily 5)  Avodart 0.5 Mg Caps (Dutasteride) .... One By Mouth Daily 6)  Uribel 118 Mg Caps (Meth-Hyo-M Bl-Na Phos-Ph Sal) .Marland Kitchen.. 1 Once Daily  Current Medications (verified): 1)  Xanax 0.25 Mg Tabs (Alprazolam) .... Every 8 Hrs As Needed 2)  Diovan Hct 160-12.5 Mg  Tabs (Valsartan-Hydrochlorothiazide) .... Once Daily 3)  Crestor 5 Mg  Tabs (Rosuvastatin Calcium) .... Once Daily 4)  Rapaflo 4 Mg Caps (Silodosin) .... One By Mouth Daily 5)  Avodart 0.5 Mg Caps (Dutasteride) .... One By Mouth Daily 6)  Uribel 118 Mg Caps (Meth-Hyo-M Bl-Na Phos-Ph Sal) .Marland Kitchen.. 1 Once  Daily  Allergies (verified): No Known Drug Allergies  Past History:  Family History: Last updated: 09/28/2007 Family History of Anxiety Family History High cholesterol Family History Hypertension  Social History: Last updated: 09/28/2007 Married Former Smoker Alcohol use-yes Drug use-no  Risk Factors: Smoking Status: never (10/02/2010) Passive Smoke Exposure: no (10/02/2010)  Past medical, surgical, family and social histories (including risk factors) reviewed, and no changes noted (except as noted  below).  Past Medical History: Reviewed history from 09/28/2007 and no changes required. Hyperlipidemia Hypertension chronic sinusitis 473.8 chronic interstitial cystiti 595.1 Allergic rhinitis Osteoarthritis  Past Surgical History: Reviewed history from 09/28/2007 and no changes required. cd bxdeystoscpy bla Transurethral resection of prostate  Family History: Reviewed history from 09/28/2007 and no changes required. Family History of Anxiety Family History High cholesterol Family History Hypertension  Social History: Reviewed history from 09/28/2007 and no changes required. Married Former Smoker Alcohol use-yes Drug use-no  Review of Systems  The patient denies anorexia, fever, weight loss, weight gain, vision loss, decreased hearing, hoarseness, chest pain, syncope, dyspnea on exertion, peripheral edema, prolonged cough, headaches, hemoptysis, abdominal pain, melena, hematochezia, severe indigestion/heartburn, hematuria, incontinence, genital sores, muscle weakness, suspicious skin lesions, transient blindness, difficulty walking, depression, unusual weight change, abnormal bleeding, enlarged lymph nodes, angioedema, breast masses, and testicular masses.         Flu Vaccine Consent Questions     Do you have a history of severe allergic reactions to this vaccine? no    Any prior history of allergic reactions to egg and/or gelatin? no    Do you have a sensitivity to the preservative Thimersol? no    Do you have a past history of Guillan-Barre Syndrome? no    Do you currently have an acute febrile illness? no    Have you ever had a severe reaction to latex? no    Vaccine information given and explained to patient? yes    Are you currently pregnant? no    Lot Number:[id number]   Exp Date:05/24/2011   Site Given  Left Deltoid IM   Physical Exam  General:  Well-developed,well-nourished,in no acute distress; alert,appropriate and cooperative throughout examination Head:   normocephalic and male-pattern balding.   Eyes:  pupils equal, pupils round, and pupils reactive to light.   Nose:  minimal mostly clear nasal mucus Mouth:  good dentition and pharynx pink and moist.   Neck:  supple with minimally tender anterior cervical nodes bilaterally Lungs:  Normal respiratory effort, chest expands symmetrically. Lungs are clear to auscultation, no crackles or wheezes. Heart:  normal rate and regular rhythm.   Abdomen:  soft and non-tender.   Msk:  joint tenderness and joint swelling.   Neurologic:  alert & oriented X3 and DTRs symmetrical and normal.     Impression & Recommendations:  Problem # 1:  OSTEOARTHRITIS (ICD-715.90) Assessment Deteriorated multijoint tenderness  Problem # 2:  HYPERTENSION (ICD-401.9) Assessment: Unchanged  His updated medication list for this problem includes:    Diovan Hct 160-12.5 Mg Tabs (Valsartan-hydrochlorothiazide) ..... Once daily  BP today: 136/80 Prior BP: 124/76 (06/05/2010)  10 Yr Risk Heart Disease: 33 % Prior 10 Yr Risk Heart Disease: 27 % (03/06/2010)  Labs Reviewed: K+: 3.9 (10/30/2009) Creat: : 1.2 (10/30/2009)   Chol: 176 (10/30/2009)   HDL: 34.10 (10/30/2009)   LDL: 119 (10/30/2009)   TG: 115.0 (10/30/2009)  Problem # 3:  HYPERLIPIDEMIA (ICD-272.4)  His updated medication list for this problem includes:    Crestor 5 Mg Tabs (Rosuvastatin calcium) .Marland KitchenMarland KitchenMarland KitchenMarland Kitchen  Once daily  Labs Reviewed: SGOT: 25 (10/30/2009)   SGPT: 34 (10/30/2009)  Lipid Goals: Chol Goal: 200 (09/28/2007)   HDL Goal: 40 (09/28/2007)   LDL Goal: 100 (09/28/2007)   TG Goal: 150 (09/28/2007)  10 Yr Risk Heart Disease: 33 % Prior 10 Yr Risk Heart Disease: 27 % (03/06/2010)   HDL:34.10 (10/30/2009), 43.2 (10/24/2008)  LDL:119 (10/30/2009), 107 (10/24/2008)  Chol:176 (10/30/2009), 169 (10/24/2008)  Trig:115.0 (10/30/2009), 94 (10/24/2008)  Problem # 4:  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 Every 8 hrs as needed 2)  Diovan Hct 160-12.5 Mg Tabs (Valsartan-hydrochlorothiazide) .... Once daily 3)  Crestor 5 Mg Tabs (Rosuvastatin calcium) .... Once daily 4)  Rapaflo 4 Mg Caps (Silodosin) .... One by mouth daily 5)  Avodart 0.5 Mg Caps (Dutasteride) .... One by mouth daily 6)  Uribel 118 Mg Caps (Meth-hyo-m bl-na phos-ph sal) .Marland Kitchen.. 1 once daily  Other Orders: Flu Vaccine 64yrs + MEDICARE PATIENTS (Z6109) Administration Flu vaccine - MCR (U0454)  Hypertension Assessment/Plan:      The patient's hypertensive risk group is category B: At least one risk factor (excluding diabetes) with no target organ damage.  His calculated 10 year risk of coronary heart disease is 33 %.  Today's blood pressure is 136/80.  His blood pressure goal is < 140/90.  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 Prescriptions: XANAX 0.25 MG TABS (ALPRAZOLAM) every 8 hrs as needed  #90 x 3   Entered by:   Willy Eddy, LPN   Authorized by:   Stacie Glaze MD   Signed by:   Willy Eddy, LPN on 09/81/1914   Method used:   Telephoned to ...       [facility]  [address]  [phone]* (retail)       [address]       [place], [place]  [address]       Ph: [phone] or [phone]       Fax: [phone]   RxID:   [id number]    Orders Added: 1)  Flu Vaccine 53yrs + MEDICARE PATIENTS [Q2039] 2)  Administration Flu vaccine - MCR [G0008] 3)  Est. Patient Level IV [564[address]]

## 2010-12-26 NOTE — Letter (Signed)
Summary: Colonoscopy Letter  [facility]  [address]   [place], [place] [address]   Phone: [phone]  Fax: [phone]      November 05, 2010 MRN: [id number]   Robert Krause [address] [place], [place]  [address]   Dear Mr. Braaksma,   According to your medical record, it is time for you to schedule a Colonoscopy. The American Cancer Society recommends this procedure as a method to detect early colon cancer. Patients with a family history of colon cancer, or a personal history of colon polyps or inflammatory bowel disease are at increased risk.  This letter has been generated based on the recommendations made at the time of your procedure. If you feel that in your particular situation this may no longer apply, please contact our office.  Please call our office at [phone] to schedule this appointment or to update your records at your earliest convenience.  Thank you for cooperating with [place] to provide you with the very best care possible.   Sincerely,   Claudette Head, M.D.  [facility] [phone]

## 2011-01-01 NOTE — Assessment & Plan Note (Signed)
Summary: appt at 1:45/bmw   Vital Signs:  Patient profile:   77 year old male Height:      71 inches Weight:      220 pounds BMI:     30.79 Temp:     99.1 degrees F oral Pulse rate:   60 / minute Resp:     14 per minute BP sitting:   134 / 80  (left arm)  Vitals Entered By: Willy Eddy, LPN (December 24, 2010 1:39 PM) CC: c/o rt flank pain radiating around to groin and down leg Is Patient Diabetic? No   Primary Care Provider:  Stacie Glaze MD  CC:  c/o rt flank pain radiating around to groin and down leg.  History of Present Illness:  patient is a 77 year old Hispanic male who presents with acute pain of one week's duration in his right groin and femoral triangle it begins at the inguinal ligament and since to the mid  thigh.  He has not tried any over-the-counter medications he has chronic back pain that has been in the past felt to be due to chronic prostatitis and some mild degenerative joint disease his other problems include hypertension which is well controlled and hyperlipidemia as well as some chronic allergies and IC.  The patient was active doing yard work and cutting a tree he may have strained his right groin area and now have a groin pull there is no evidence of any mass cellulitis inflammation in the groin at this time.    Preventive Screening-Counseling & Management  Alcohol-Tobacco     Smoking Status: never     Passive Smoke Exposure: no  Problems Prior to Update: 1)  Groin Strain  (ICD-848.8) 2)  Contracture of Palmar Fascia  (ICD-728.6) 3)  Actinic Keratosis, Cheek, Left  (ICD-702.0) 4)  Prostatitis, Acute, Chronic  (ICD-601.0) 5)  Chest Pain Unspecified  (ICD-786.50) 6)  Finger Sup Fb w/o Maj Open Wound&w/o Mention Inf  (ICD-915.6) 7)  Preventive Health Care  (ICD-V70.0) 8)  Osteoarthritis  (ICD-715.90) 9)  Allergic Rhinitis  (ICD-477.9) 10)  Cystitis, Chronic Interstitial  (ICD-595.1) 11)  Other Chronic Sinusitis  (ICD-473.8) 12)   Hypertension  (ICD-401.9) 13)  Hyperlipidemia  (ICD-272.4)  Current Problems (verified): 1)  Contracture of Palmar Fascia  (ICD-728.6) 2)  Actinic Keratosis, Cheek, Left  (ICD-702.0) 3)  Prostatitis, Acute, Chronic  (ICD-601.0) 4)  Chest Pain Unspecified  (ICD-786.50) 5)  Finger Sup Fb w/o Maj Open Wound&w/o Mention Inf  (ICD-915.6) 6)  Preventive Health Care  (ICD-V70.0) 7)  Osteoarthritis  (ICD-715.90) 8)  Allergic Rhinitis  (ICD-477.9) 9)  Cystitis, Chronic Interstitial  (ICD-595.1) 10)  Other Chronic Sinusitis  (ICD-473.8) 11)  Hypertension  (ICD-401.9) 12)  Hyperlipidemia  (ICD-272.4)  Medications Prior to Update: 1)  Xanax 0.25 Mg Tabs (Alprazolam) .... Every 8 Hrs As Needed 2)  Diovan Hct 160-12.5 Mg  Tabs (Valsartan-Hydrochlorothiazide) .... Once Daily 3)  Crestor 5 Mg  Tabs (Rosuvastatin Calcium) .... Once Daily 4)  Rapaflo 4 Mg Caps (Silodosin) .... One By Mouth Daily 5)  Avodart 0.5 Mg Caps (Dutasteride) .... One By Mouth Daily 6)  Uribel 118 Mg Caps (Meth-Hyo-M Bl-Na Phos-Ph Sal) .Marland Kitchen.. 1 Once Daily  Current Medications (verified): 1)  Xanax 0.25 Mg Tabs (Alprazolam) .... Every 8 Hrs As Needed 2)  Diovan Hct 160-12.5 Mg  Tabs (Valsartan-Hydrochlorothiazide) .... Once Daily 3)  Crestor 5 Mg  Tabs (Rosuvastatin Calcium) .... Once Daily 4)  Rapaflo 4 Mg Caps (Silodosin) .... One By  Mouth Daily 5)  Avodart 0.5 Mg Caps (Dutasteride) .... One By Mouth Daily 6)  Uribel 118 Mg Caps (Meth-Hyo-M Bl-Na Phos-Ph Sal) .Marland Kitchen.. 1 Once Daily  Allergies (verified): No Known Drug Allergies  Past History:  Family History: Last updated: 09/28/2007 Family History of Anxiety Family History High cholesterol Family History Hypertension  Social History: Last updated: 09/28/2007 Married Former Smoker Alcohol use-yes Drug use-no  Risk Factors: Smoking Status: never (12/24/2010) Passive Smoke Exposure: no (12/24/2010)  Past medical, surgical, family and social histories (including  risk factors) reviewed, and no changes noted (except as noted below).  Past Medical History: Reviewed history from 09/28/2007 and no changes required. Hyperlipidemia Hypertension chronic sinusitis 473.8 chronic interstitial cystiti 595.1 Allergic rhinitis Osteoarthritis  Past Surgical History: Reviewed history from 09/28/2007 and no changes required. cd bxdeystoscpy bla Transurethral resection of prostate  Family History: Reviewed history from 09/28/2007 and no changes required. Family History of Anxiety Family History High cholesterol Family History Hypertension  Social History: Reviewed history from 09/28/2007 and no changes required. Married Former Smoker Alcohol use-yes Drug use-no  Physical Exam  General:  Well-developed,well-nourished,in no acute distress; alert,appropriate and cooperative throughout examination Head:  normocephalic and male-pattern balding.   Eyes:  pupils equal, pupils round, and pupils reactive to light.   Ears:  R ear normal and L ear normal.   Nose:  minimal mostly clear nasal mucus Mouth:  good dentition and pharynx pink and moist.   Lungs:  Normal respiratory effort, chest expands symmetrically. Lungs are clear to auscultation, no crackles or wheezes. Heart:  normal rate and regular rhythm.   Abdomen:  soft and non-tender.   Msk:  tenderness in the femoral triangle Extremities:  No clubbing, cyanosis, edema, or deformity noted with normal full range of motion of all joints.   Neurologic:  alert & oriented X3 and DTRs symmetrical and normal.     Impression & Recommendations:  Problem # 1:  GROIN STRAIN (ICD-848.8)  patient has acute groin strain from yard work we recommended that he apply heat to the area twice a day for 15-20 minutes and begin Celebrex 200 mg by mouth twice a day for approximately 2 weeks samples of Celebrex were given instructions for moist heat were given  Problem # 2:  HYPERTENSION (ICD-401.9)  His updated  medication list for this problem includes:    Diovan Hct 160-12.5 Mg Tabs (Valsartan-hydrochlorothiazide) ..... Once daily  BP today: 134/80 Prior BP: 136/80 (10/02/2010)  Prior 10 Yr Risk Heart Disease: 33 % (10/02/2010)  Labs Reviewed: K+: 3.9 (10/30/2009) Creat: : 1.2 (10/30/2009)   Chol: 176 (10/30/2009)   HDL: 34.10 (10/30/2009)   LDL: 119 (10/30/2009)   TG: 115.0 (10/30/2009)  Complete Medication List: 1)  Xanax 0.25 Mg Tabs (Alprazolam) .... Every 8 hrs as needed 2)  Diovan Hct 160-12.5 Mg Tabs (Valsartan-hydrochlorothiazide) .... Once daily 3)  Crestor 5 Mg Tabs (Rosuvastatin calcium) .... Once daily 4)  Rapaflo 4 Mg Caps (Silodosin) .... One by mouth daily 5)  Avodart 0.5 Mg Caps (Dutasteride) .... One by mouth daily 6)  Uribel 118 Mg Caps (Meth-hyo-m bl-na phos-ph sal) .Marland Kitchen.. 1 once daily  Patient Instructions: 1)   Celebrex 200 mg twice a day for one week use moist heat such as a wash cloth has been using the microwave and apply it to the site for about 15 minutes twice a day or as needed. Keep your appointment as scheduled   Orders Added: 1)  Est. Patient Level III [04540]

## 2011-01-15 ENCOUNTER — Encounter: Payer: Self-pay | Admitting: Internal Medicine

## 2011-01-16 ENCOUNTER — Encounter: Payer: Self-pay | Admitting: Internal Medicine

## 2011-01-16 ENCOUNTER — Ambulatory Visit (INDEPENDENT_AMBULATORY_CARE_PROVIDER_SITE_OTHER): Payer: Medicare Other | Admitting: Internal Medicine

## 2011-01-16 VITALS — BP 132/86 | HR 76 | Temp 98.4°F | Resp 14 | Ht 68.0 in | Wt 224.0 lb

## 2011-01-16 DIAGNOSIS — Z0000 Encounter for general adult medical examination without abnormal findings: Secondary | ICD-10-CM

## 2011-01-16 DIAGNOSIS — I10 Essential (primary) hypertension: Secondary | ICD-10-CM

## 2011-01-16 DIAGNOSIS — N3010 Interstitial cystitis (chronic) without hematuria: Secondary | ICD-10-CM

## 2011-01-16 DIAGNOSIS — IMO0002 Reserved for concepts with insufficient information to code with codable children: Secondary | ICD-10-CM

## 2011-01-16 NOTE — Progress Notes (Signed)
  Subjective:    Patient ID: Robert Krause, male    DOB: 01-Jun-1934, 77 y.o.   MRN: [id number]  HPI   patient is a 77 year old white male who presents for return office visit for acute on chronic groin strain he was given a course of Celebrex 200 mg by mouth daily and responded to the medication with a marked improvement in the pain he states that there is mild residual pain but he has been stretching and exercising and the pain appears to be controllable at this time.  He has noted no masses in his groin the swelling of his leg no redness of the skin no rashes have appeared.  His hypertension is well controlled he had a PSA monitored by his urologist which was lower than his current readings and we reassured them that this was good news.  Review of Systems  Constitutional: Negative for fever and fatigue.  HENT: Negative for hearing loss, congestion, neck pain and postnasal drip.   Eyes: Negative for discharge, redness and visual disturbance.  Respiratory: Negative for cough, shortness of breath and wheezing.   Cardiovascular: Negative for leg swelling.  Gastrointestinal: Negative for abdominal pain, constipation and abdominal distention.  Genitourinary: Negative for urgency and frequency.  Musculoskeletal: Negative for joint swelling and arthralgias.  Skin: Negative for color change and rash.  Neurological: Negative for weakness and light-headedness.  Hematological: Negative for adenopathy.  Psychiatric/Behavioral: Negative for behavioral problems.       Objective:   Physical Exam  Constitutional: He appears well-developed and well-nourished.  HENT:  Head: Normocephalic and atraumatic.  Eyes: Conjunctivae are normal. Pupils are equal, round, and reactive to light.  Neck: Normal range of motion. Neck supple.  Cardiovascular: Normal rate and regular rhythm.   Pulmonary/Chest: Effort normal and breath sounds normal.  Abdominal: Soft. Bowel sounds are normal.  Musculoskeletal:       Right shoulder: He exhibits tenderness.       Right hip: He exhibits tenderness.       Legs:         Assessment & Plan:   he has a resolving groin tendon pull that has improved with a course of Celebrex and stretching no further intervention is indicated at this time reassurance to monitor the site and call us should the pain resumed he was instructed to be careful about lifting and exercise.  His blood pressure is well controlled and we provided samples of his medication.  His prostatitis appears to be in remission at this time PSA was low and he has no prostate symptoms.  His other chronic problems appear to be stable we reviewed his medications we set up a health maintenance examination for him and ordered appropriate laboratory studies.

## 2011-03-27 ENCOUNTER — Ambulatory Visit (AMBULATORY_SURGERY_CENTER): Payer: Medicare Other | Admitting: *Deleted

## 2011-03-27 ENCOUNTER — Encounter: Payer: Self-pay | Admitting: Gastroenterology

## 2011-03-27 VITALS — Ht 69.0 in | Wt 221.0 lb

## 2011-03-27 DIAGNOSIS — Z86010 Personal history of colonic polyps: Secondary | ICD-10-CM

## 2011-03-27 MED ORDER — PEG-KCL-NACL-NASULF-NA ASC-C 100 G PO SOLR: ORAL | Status: DC

## 2011-04-11 NOTE — Op Note (Signed)
NAME:  Robert Krause, Robert Krause                       ACCOUNT NO.:  [id number]   MEDICAL RECORD NO.:  [id number]                   PATIENT TYPE:  AMB   LOCATION:  [facility]                                 FACILITY:  [facility]   PHYSICIAN:  Jamison Neighbor, M.D.               DATE OF BIRTH:  August 16, 1934   DATE OF PROCEDURE:  05/19/2003  DATE OF DISCHARGE:                                 OPERATIVE REPORT   PREOPERATIVE DIAGNOSIS:  Chronic pelvic pain syndrome, rule out interstitial  cystitis.   POSTOPERATIVE DIAGNOSIS:  Chronic pelvic pain syndrome, rule out  interstitial cystitis.   OPERATION/PROCEDURE:  1. Cystoscopy.  2. Hydrodistention of the bladder.  3. Bladder biopsy.  4. Marcaine and Pyridium instillations.   SURGEON:  Jamison Neighbor, M.D.   ANESTHESIA:  General.   COMPLICATIONS:  None.   DRAINS:  None.   BRIEF HISTORY:  This 77 year old male has had lower urinary tract symptoms  felt initially to be due to chronic prostatitis.  The patient has had a TURP  back in 1996.  He does have some problems with bladder outlet obstruction  and he has had ongoing problems with urgency, frequency, low back pain,  lower abdominal pain, and a sense of incomplete voiding.  The patient has  been treated with antibiotic therapy without much improvement.  We have  incidentally found that he does have an asymptomatic right hydrocele.  The  patient has been treated in a standard fashion for prostatitis with  Levaquin, Flomax, and anti-inflammatories without all that much improvement.  He has been concerned about the possibility that he has chronic pelvic pain  syndrome.  The patient has been advised of recent theories concerning  chronic prostatitis which would indicate that in many cases this is not due  to infection but due to a chronic inflammatory problem within the prostatic  urethra and possibly within the bladder.  The patient was offered the  possibility of going on the [place]  Elmiron prostatitis trial but  elected to undergo a trial Elmiron without hydrodistention.  The patient  returned in followup and was concerned that he had not had all that much  improvement and he decided not to take the Elmiron as it made him groggy and  was just taking trazodone and Valium.  The patient is now to undergo  diagnostic cystoscopy for further evaluation of the urethra, prostate and  bladder.  He understands the risks and benefits of the procedure and he made  full and informed consent.   DESCRIPTION OF PROCEDURE:  After successful induction of general anesthesia,  the patient was placed in the dorsal lithotomy position and prepped with  Betadine and draped in the usual sterile fashion.  The patient's examination  showed a normal left testicle.  On the right side, there was a hydrocele  detected which was asymptomatic.  The cord structures were otherwise  unremarkable.  There was  no adenopathy detected. The penis was free of any  lesions.  Cystoscopy was performed.  Urethra was visualized in its entirety  and was found to be normal.  Beyond the sphincter mechanism the verumontanum  could be identified.  From that point forward the prostate was wide open  with no signs of bladder outlet obstruction.  There was no bladder neck  contracture.  There had been no regrowth of prostate and it was felt to be a  complete unremarkable prostatic fossa.  The bladder was inspected.  It was  free of any tumor or stones.  Both ureteral orifices were normal in  configuration and location.  The bladder was distended at a pressure of 100  cm of water for five minutes.  When the bladder was drained, the bladder  capacity was found to be essentially normal at just over 1000 mL.  The  patient had little in the way of glomerulation and had a relatively normal-  appearing bladder.  A biopsy was taken and was cauterized with  electrocautery.  The patient had a mix of Marcaine and Pyridium was left  in  the bladder.  He tolerated the procedure and was taken to the recovery room  in good condition.   He will be sent home on Levaquin as well as Pyridium Plus and Lorcet Plus  and continue on his present medications.  We will discuss with him several  treatment options for his chronic prostatitis syndrome type 3.  This would  include ongoing use of alpha blockers, ongoing use of Elmiron, symptomatic  management with tricyclic antidepressants, possible use of herbal agents  such as corsitin or saw palmetto, as well as potential benefit of using some  of these drugs in combination for symptomatic relief.  He will be advised  that he does not have a classic form of interstitial cystitis but certainly  he might respond to Elmiron since the current theory would suggest that  using this for chronic pelvic pain syndrome, type 3, is worthwhile and may  be work on the basis of coating the prostatic ducts as opposed the bladder  itself.  Decisions as to future therapy will be based on his response to the  hydrodistention, the mass cell biopsy, as well as further discussion with  the patient and his wife at our next office visit.                                                 Jamison Neighbor, M.D.    RJE/MEDQ  D:  05/19/2003  T:  05/19/2003  Job:  [id number]   cc:   Stacie Glaze, M.D. [facility]

## 2011-04-11 NOTE — Op Note (Signed)
NAMEEJ, PINSON NO.:  [id number]   MEDICAL RECORD NO.:  [id number]          PATIENT TYPE:  AMB   LOCATION:  [facility]                         FACILITY:  [facility]   PHYSICIAN:  Jamison Neighbor, M.D.  DATE OF BIRTH:  06-21-1934   DATE OF PROCEDURE:  01/31/2005  DATE OF DISCHARGE:                                 OPERATIVE REPORT   PREOPERATIVE DIAGNOSIS:  Chronic nonbacterial prostatitis (CP/CPPS3B).   POSTOPERATIVE DIAGNOSIS:  Chronic nonbacterial prostatitis (CP/CPPS3B).   PROCEDURE:  Cystoscopy, hydrodistention of the bladder, Marcaine and  Pyridium instillation.   SURGEON:  Jamison Neighbor, M.D.   ANESTHESIA:  General.   COMPLICATIONS:  None.   DRAINS:  None.   BRIEF HISTORY:  Mr. Colborn is felt to have chronic nonbacterial prostatitis,  which is generally referred to now as chronic pelvic pain syndrome, type 3B.  The patient indicates he has episodes of what appear to be true bacterial  infections with response to antibiotic therapy, but at this time, his  response to antibiotic therapy remains poor.  The patient has had a trial of  Levaquin but has had no improvement.  When we saw him on January 15, 2005,  we noted he had a completely normal urinalysis with no evidence of red  cells, white cells, or bacteria seen.  The patient has undergone  hydrodistention of the bladder in the past to determined if this chronic  pelvic pain syndrome might be secondary to interstitial cystitis.  It is  known that these conditions are quite similar and may be identifiable, and  current research would suggest that patients with the type 3B chronic pelvic  pain may respond to hydrodistention and also to other interstitial cystitis-  directed therapies such as Elmiron.  The patient has tried medications like  this in the past and has not done well.  He sometimes can tolerate Atarax  for an antihistamine effect and occasionally can tolerate Xanax for his  pelvic floor, but  he cannot tolerate Elmiron.  The patient is currently on  no medications for his bladder or pelvis.  He has requested repeat  hydrodistention be done because those have helped him in the past.  Patient  understands the risks and benefits of the procedure, that there is no  guarantee that he will have the same improvement that he has had in the  past, but he would like to make sure that there is no evidence of any other  prostatic or bladder disease.  Patient understands the risks and benefits of  the procedure and gave full informed consent.   PROCEDURE:  After the successful induction of general anesthesia, the  patient is placed in a dorsal lithotomy position, prepped with Betadine, and  draped in the usual sterile fashion.  Cystoscopic examination revealed a  normal urethra.  Beyond the verumontanum, there was minimal prostatic  enlargement without obvious obstruction.  The bladder was carefully  inspected.  It was free of any tumor or stones.  Both ureteral orifices were  normal in configuration and location.  Hydrodistention of the bladder was  then performed.  The bladder was distended at a pressure of 100 cm of water  for five minutes.  When the bladder was drained, there were a little  ___________ glomerulations.  The bladder capacity was 1050, which is almost  normal, and this had a very normal appearance.  The bladder was drained, and  a mixture of Marcaine and Pyridium was left in the bladder.  The patient  will receive intraoperative Toradol and Zofran as well as a B&O suppository.  He will be sent home with Lorcet Plus, Pyridium Plus, and Levaquin.  When he  returns, we will try to convince him to go back on his antihistamine therapy  or at least go back on his pelvic floor therapy.  We will try to convince  him to consider physical therapy as a way to treat some of his chronic  pelvic pain.  We will also consider alpha blockade.  Based on recent  research, patients who do not  respond to antibiotic therapy and anti-  inflammatories do occasionally respond to alpha blockers if given long term.   ADDENDUM:  Patient was noted to have a preoperative hemoglobin of 8.9.  I  will discuss this with the patient and make sure that he gets back with Dr.  Lovell Sheehan.      RJE/MEDQ  D:  01/31/2005  T:  01/31/2005  Job:  [id number]   cc:   Stacie Glaze, M.D. [facility]

## 2011-04-14 DIAGNOSIS — K635 Polyp of colon: Secondary | ICD-10-CM

## 2011-04-14 HISTORY — DX: Polyp of colon: K63.5

## 2011-04-15 ENCOUNTER — Ambulatory Visit (AMBULATORY_SURGERY_CENTER): Payer: Medicare Other | Admitting: Gastroenterology

## 2011-04-15 ENCOUNTER — Encounter: Payer: Self-pay | Admitting: Gastroenterology

## 2011-04-15 VITALS — BP 126/66 | HR 48 | Temp 97.1°F | Resp 22 | Ht 69.0 in | Wt 210.0 lb

## 2011-04-15 DIAGNOSIS — Z860100 Personal history of colon polyps, unspecified: Secondary | ICD-10-CM

## 2011-04-15 DIAGNOSIS — D126 Benign neoplasm of colon, unspecified: Secondary | ICD-10-CM

## 2011-04-15 DIAGNOSIS — Z1211 Encounter for screening for malignant neoplasm of colon: Secondary | ICD-10-CM

## 2011-04-15 DIAGNOSIS — Z800 Family history of malignant neoplasm of digestive organs: Secondary | ICD-10-CM

## 2011-04-15 MED ORDER — SODIUM CHLORIDE 0.9 % IV SOLN: 500.0000 mL | INTRAVENOUS | Status: DC

## 2011-04-15 NOTE — Patient Instructions (Signed)
Please refer to blue and green discharge instruction sheets. Polyps and diverticulosis found today. Polyps were removed.  Wait for pathology to come back.  Dr. Russella Dar will mail you a letter with the results. Repeat colonoscopy in 5 years. Eat a high fiber diet with liberal fluid intake AFTER TODAY.  Be careful and change positions slowly.

## 2011-04-16 ENCOUNTER — Telehealth: Payer: Self-pay

## 2011-04-16 ENCOUNTER — Encounter (INDEPENDENT_AMBULATORY_CARE_PROVIDER_SITE_OTHER): Payer: Medicare Other | Admitting: Internal Medicine

## 2011-04-16 DIAGNOSIS — I10 Essential (primary) hypertension: Secondary | ICD-10-CM

## 2011-04-16 DIAGNOSIS — Z0000 Encounter for general adult medical examination without abnormal findings: Secondary | ICD-10-CM

## 2011-04-16 DIAGNOSIS — E785 Hyperlipidemia, unspecified: Secondary | ICD-10-CM

## 2011-04-16 LAB — BASIC METABOLIC PANEL
CO2: 28 mEq/L (ref 19–32)
Chloride: 104 mEq/L (ref 96–112)
Sodium: 138 mEq/L (ref 135–145)

## 2011-04-16 LAB — LIPID PANEL
Cholesterol: 169 mg/dL (ref 0–200)
LDL Cholesterol: 109 mg/dL — ABNORMAL HIGH (ref 0–99)
Triglycerides: 75 mg/dL (ref 0.0–149.0)
VLDL: 15 mg/dL (ref 0.0–40.0)

## 2011-04-16 LAB — HEPATIC FUNCTION PANEL
ALT: 22 U/L (ref 0–53)
Alkaline Phosphatase: 49 U/L (ref 39–117)
Bilirubin, Direct: 0.1 mg/dL (ref 0.0–0.3)
Total Protein: 6.8 g/dL (ref 6.0–8.3)

## 2011-04-16 LAB — CBC WITH DIFFERENTIAL/PLATELET
Basophils Absolute: 0 10*3/uL (ref 0.0–0.1)
Basophils Relative: 0.4 % (ref 0.0–3.0)
Eosinophils Absolute: 0.2 10*3/uL (ref 0.0–0.7)
Lymphocytes Relative: 36.8 % (ref 12.0–46.0)
MCHC: 34.2 g/dL (ref 30.0–36.0)
MCV: 94.1 fl (ref 78.0–100.0)
Monocytes Absolute: 0.5 10*3/uL (ref 0.1–1.0)
Neutro Abs: 3.1 10*3/uL (ref 1.4–7.7)
Neutrophils Relative %: 50.9 % (ref 43.0–77.0)
RBC: 4.86 Mil/uL (ref 4.22–5.81)
RDW: 13.1 % (ref 11.5–14.6)

## 2011-04-16 NOTE — Progress Notes (Signed)
  Subjective:    Patient ID: Robert Krause, male    DOB: 1934/02/05, 77 y.o.   MRN: [id number]  HPI    Review of Systems     Objective:   Physical Exam        Assessment & Plan:

## 2011-04-16 NOTE — Telephone Encounter (Signed)

## 2011-04-23 ENCOUNTER — Encounter: Payer: Self-pay | Admitting: Gastroenterology

## 2011-04-29 ENCOUNTER — Ambulatory Visit (INDEPENDENT_AMBULATORY_CARE_PROVIDER_SITE_OTHER): Payer: Medicare Other | Admitting: Internal Medicine

## 2011-04-29 ENCOUNTER — Encounter: Payer: Self-pay | Admitting: Internal Medicine

## 2011-04-29 ENCOUNTER — Other Ambulatory Visit: Payer: Self-pay | Admitting: *Deleted

## 2011-04-29 DIAGNOSIS — J328 Other chronic sinusitis: Secondary | ICD-10-CM

## 2011-04-29 DIAGNOSIS — E785 Hyperlipidemia, unspecified: Secondary | ICD-10-CM

## 2011-04-29 DIAGNOSIS — I10 Essential (primary) hypertension: Secondary | ICD-10-CM

## 2011-04-29 DIAGNOSIS — N3010 Interstitial cystitis (chronic) without hematuria: Secondary | ICD-10-CM

## 2011-04-29 DIAGNOSIS — Z0000 Encounter for general adult medical examination without abnormal findings: Secondary | ICD-10-CM

## 2011-04-29 MED ORDER — ALPRAZOLAM 0.25 MG PO TABS: 0.2500 mg | ORAL_TABLET | Freq: Three times a day (TID) | ORAL | Status: DC | PRN

## 2011-04-29 NOTE — Progress Notes (Signed)
Subjective:    Patient ID: Robert Krause, male    DOB: 03/05/1934, 77 y.o.   MRN: [id number]  HPI CPX reviewed labs Seeing the urologist for psa and bladder check The pt had a colonoscopy and noted low pulse with the versed The pt noted cramping in hands ( worse in the same had with the contracture) Lipids reveiwed has not been on the Crestor regularly Still clearing throat Chronic back pain HTN stable  Review of Systems  Constitutional: Negative for fever and fatigue.  HENT: Positive for congestion, rhinorrhea and postnasal drip. Negative for hearing loss and neck pain.   Eyes: Negative for discharge, redness and visual disturbance.  Respiratory: Positive for cough. Negative for shortness of breath and wheezing.   Cardiovascular: Negative for leg swelling.  Gastrointestinal: Negative for abdominal pain, constipation and abdominal distention.  Genitourinary: Positive for frequency. Negative for urgency.  Musculoskeletal: Negative for joint swelling and arthralgias.  Skin: Negative for color change and rash.  Neurological: Negative for weakness and light-headedness.  Hematological: Negative for adenopathy.  Psychiatric/Behavioral: Negative for behavioral problems.   Past Medical History  Diagnosis Date  . Hyperlipidemia   . Hypertension   . Chronic sinusitis   . Chronic interstitial cystitis   . Allergic rhinitis   . Osteoarthritis   . Anxiety    Past Surgical History  Procedure Date  . Transurethral resection of prostate   . Colonoscopy   . Polypectomy   . Appendectomy     reports that he has quit smoking. He has never used smokeless tobacco. He reports that he drinks about 1.8 ounces of alcohol per week. He reports that he does not use illicit drugs. family history includes Anxiety disorder in an unspecified family member; Hyperlipidemia in an unspecified family member; and Hypertension in an unspecified family member. No Known Allergies      Objective:   Physical Exam    Blood pressure 120/78, pulse 76, temperature 98.2 F (36.8 C), resp. rate 16, height 5\' 10"  (1.778 m), weight 216 lb (97.977 kg).  On physical examination he is an elderly Hispanic male in no apparent distress HEENT shows male pattern balding pupils are equal reactive to light and accommodation neck was supple without JVD or bruit heart examination revealed a regular rate and rhythm abdomen was soft and nontender lung fields are clear to auscultation and percussion examination of the nasal passages revealed thick mucous with posterior nasal drip.  Extremity exam revealed no edema logically he was intact with equal grips normal deep tendon reflexes. Alert and oriented and appropriate cognitive function was intact.  Prostate examination was deferred to his urologist has an appointment next week   Assessment & Plan:   Patient presents for yearly preventative medicine examination.   all immunizations and health maintenance protocols were reviewed with the patient and they are up to date with these protocols.   screening laboratory values were reviewed with the patient including screening of hyperlipidemia PSA renal function and hepatic function.   There medications past medical history social history problem list and allergies were reviewed in detail.   Goals were established with regard to weight loss exercise diet in compliance with medications His blood pressure is stable on his current medication samples and prescription will be given he has stopped using the Avodart but his prostate appears to be normal this is in agreement with his urologist.  He is seen by the urologist for his interstitial cystitis as well he cannot take potassium-containing foods because  of the interstitial cystitis and believed that the cramping in his hands is most probably secondary to hypo-K. with sweating. His cholesterol is at goal despite his intermittent use of the Crestor this is most probably  due to the long half-life of Crestor samples of Crestor but it

## 2011-06-06 ENCOUNTER — Other Ambulatory Visit: Payer: Self-pay | Admitting: *Deleted

## 2011-06-06 MED ORDER — URIBEL 118 MG PO CAPS: 118.0000 mg | ORAL_CAPSULE | Freq: Every day | ORAL | Status: DC

## 2011-08-05 ENCOUNTER — Ambulatory Visit: Payer: Medicare Other | Admitting: Internal Medicine

## 2011-09-05 ENCOUNTER — Ambulatory Visit (INDEPENDENT_AMBULATORY_CARE_PROVIDER_SITE_OTHER): Payer: Medicare Other

## 2011-09-05 DIAGNOSIS — Z23 Encounter for immunization: Secondary | ICD-10-CM

## 2011-10-03 ENCOUNTER — Ambulatory Visit (INDEPENDENT_AMBULATORY_CARE_PROVIDER_SITE_OTHER): Payer: Medicare Other | Admitting: Internal Medicine

## 2011-10-03 ENCOUNTER — Encounter: Payer: Self-pay | Admitting: Internal Medicine

## 2011-10-03 VITALS — BP 140/84 | HR 72 | Temp 98.2°F | Resp 16 | Ht 70.0 in | Wt 220.0 lb

## 2011-10-03 DIAGNOSIS — E785 Hyperlipidemia, unspecified: Secondary | ICD-10-CM

## 2011-10-03 DIAGNOSIS — I10 Essential (primary) hypertension: Secondary | ICD-10-CM

## 2011-10-03 DIAGNOSIS — J329 Chronic sinusitis, unspecified: Secondary | ICD-10-CM

## 2011-10-03 DIAGNOSIS — N3010 Interstitial cystitis (chronic) without hematuria: Secondary | ICD-10-CM

## 2011-10-03 DIAGNOSIS — N410 Acute prostatitis: Secondary | ICD-10-CM

## 2011-10-03 LAB — CBC WITH DIFFERENTIAL/PLATELET
Basophils Relative: 0.9 % (ref 0.0–3.0)
Eosinophils Relative: 4.6 % (ref 0.0–5.0)
HCT: 47 % (ref 39.0–52.0)
Hemoglobin: 16 g/dL (ref 13.0–17.0)
Lymphs Abs: 2 10*3/uL (ref 0.7–4.0)
MCV: 94.3 fl (ref 78.0–100.0)
Monocytes Absolute: 0.6 10*3/uL (ref 0.1–1.0)
Monocytes Relative: 9.2 % (ref 3.0–12.0)
Neutro Abs: 3.9 10*3/uL (ref 1.4–7.7)
RBC: 4.99 Mil/uL (ref 4.22–5.81)
WBC: 6.9 10*3/uL (ref 4.5–10.5)

## 2011-10-03 LAB — BASIC METABOLIC PANEL
Chloride: 102 mEq/L (ref 96–112)
GFR: 75.69 mL/min (ref >60.00)
Potassium: 4.3 mEq/L (ref 3.5–5.1)
Sodium: 137 mEq/L (ref 135–145)

## 2011-10-03 LAB — HEPATIC FUNCTION PANEL
ALT: 21 U/L (ref 0–53)
Bilirubin, Direct: 0.1 mg/dL (ref 0.0–0.3)
Total Bilirubin: 0.9 mg/dL (ref 0.3–1.2)
Total Protein: 7.2 g/dL (ref 6.0–8.3)

## 2011-10-03 LAB — LIPID PANEL
Cholesterol: 183 mg/dL (ref 0–200)
LDL Cholesterol: 115 mg/dL — ABNORMAL HIGH (ref 0–99)
Total CHOL/HDL Ratio: 4
VLDL: 23.2 mg/dL (ref 0.0–40.0)

## 2011-10-03 MED ORDER — CIPROFLOXACIN HCL 250 MG PO TABS: 250.0000 mg | ORAL_TABLET | Freq: Two times a day (BID) | ORAL | Status: AC

## 2011-10-03 NOTE — Patient Instructions (Signed)
The patient is instructed to continue all medications as prescribed. Schedule followup with check out clerk upon leaving the clinic  

## 2011-10-06 NOTE — Progress Notes (Signed)
  Subjective:    Patient ID: Robert Krause, male    DOB: November 18, 1934, 77 y.o.   MRN: [id number]  HPI Patient presents for routine followup of hypertension hyperlipidemia and benign prostatic hypertrophy. He has acute complaint of sinusitis with severe postnasal drip sore throat and cough History history of chronic sinusitis His blood pressure is stable his current medications without complaints he sees a urologist for his prostatic hypertrophy and for interstitial cystitis   Review of Systems  Constitutional: Negative for fever and fatigue.  HENT: Negative for hearing loss, congestion, neck pain and postnasal drip.   Eyes: Negative for discharge, redness and visual disturbance.  Respiratory: Negative for cough, shortness of breath and wheezing.   Cardiovascular: Negative for leg swelling.  Gastrointestinal: Negative for abdominal pain, constipation and abdominal distention.  Genitourinary: Negative for urgency and frequency.  Musculoskeletal: Negative for joint swelling and arthralgias.  Skin: Negative for color change and rash.  Neurological: Negative for weakness and light-headedness.  Hematological: Negative for adenopathy.  Psychiatric/Behavioral: Negative for behavioral problems.   Past Medical History  Diagnosis Date  . Hyperlipidemia   . Hypertension   . Chronic sinusitis   . Chronic interstitial cystitis   . Allergic rhinitis   . Osteoarthritis   . Anxiety    Past Surgical History  Procedure Date  . Transurethral resection of prostate   . Colonoscopy   . Polypectomy   . Appendectomy     reports that he has quit smoking. He has never used smokeless tobacco. He reports that he drinks about 1.8 ounces of alcohol per week. He reports that he does not use illicit drugs. family history includes Anxiety disorder in an unspecified family member; Hyperlipidemia in an unspecified family member; and Hypertension in an unspecified family member. No Known Allergies        Objective:   Physical Exam  Constitutional: He appears well-developed and well-nourished.  HENT:  Head: Normocephalic and atraumatic.  Eyes: Conjunctivae are normal. Pupils are equal, round, and reactive to light.  Neck: Normal range of motion. Neck supple.  Cardiovascular: Normal rate and regular rhythm.   Pulmonary/Chest: Effort normal and breath sounds normal.  Abdominal: Soft. Bowel sounds are normal.          Assessment & Plan:  Blood pressure stable on current medications recheck of his blood pressure was 135/80 weight is stable we discussed weight loss and exercise as adjuvant therapy to his blood pressure control.  Cholesterol should be monitored benign prostatic hypertrophy is stable with followup planned with his urologist acute sinusitis will be treated with antibiotics and recommend Mucinex D. twice daily Refills of his medication 4 sinusitis he will be placed on ciprofloxacin

## 2012-01-06 ENCOUNTER — Encounter: Payer: Self-pay | Admitting: Internal Medicine

## 2012-01-06 ENCOUNTER — Ambulatory Visit (INDEPENDENT_AMBULATORY_CARE_PROVIDER_SITE_OTHER): Payer: Medicare Other | Admitting: Internal Medicine

## 2012-01-06 VITALS — BP 112/74 | HR 68 | Temp 98.4°F | Ht 69.0 in | Wt 221.0 lb

## 2012-01-06 DIAGNOSIS — F419 Anxiety disorder, unspecified: Secondary | ICD-10-CM

## 2012-01-06 DIAGNOSIS — I10 Essential (primary) hypertension: Secondary | ICD-10-CM

## 2012-01-06 DIAGNOSIS — M79609 Pain in unspecified limb: Secondary | ICD-10-CM

## 2012-01-06 DIAGNOSIS — F411 Generalized anxiety disorder: Secondary | ICD-10-CM

## 2012-01-06 DIAGNOSIS — M79606 Pain in leg, unspecified: Secondary | ICD-10-CM

## 2012-01-06 MED ORDER — ALPRAZOLAM 0.25 MG PO TABS: 0.2500 mg | ORAL_TABLET | Freq: Three times a day (TID) | ORAL | Status: DC | PRN

## 2012-01-06 MED ORDER — CIPROFLOXACIN HCL 500 MG PO TABS: 500.0000 mg | ORAL_TABLET | Freq: Two times a day (BID) | ORAL | Status: DC

## 2012-01-06 NOTE — Patient Instructions (Signed)
The patient is instructed to continue all medications as prescribed. Schedule followup with check out clerk upon leaving the clinic  

## 2012-01-06 NOTE — Progress Notes (Signed)
  Subjective:    Patient ID: Robert Krause, male    DOB: 06-09-1934, 78 y.o.   MRN: [id number]  HPI Patient has a new complaint of leg pain in the left leg it extends from the top of the left foot to the lateral left calf and stops at the knee.  This hurts most at night. Has a sensation of aching and burning. He senses a 24 hours a day but the sensation is worse at night when he lays down.  He has no family history of diabetes No significant history of elevated blood glucoses on basic metabolic panels checked over the last 4 years    Review of Systems  Constitutional: Negative for fever and fatigue.  HENT: Negative for hearing loss, congestion, neck pain and postnasal drip.   Eyes: Negative for discharge, redness and visual disturbance.  Respiratory: Negative for cough, shortness of breath and wheezing.   Cardiovascular: Negative for leg swelling.  Gastrointestinal: Negative for abdominal pain, constipation and abdominal distention.  Genitourinary: Negative for urgency and frequency.  Musculoskeletal: Positive for myalgias, joint swelling and arthralgias.  Skin: Negative for color change and rash.  Neurological: Negative for weakness and light-headedness.  Hematological: Negative for adenopathy.  Psychiatric/Behavioral: Negative for behavioral problems.       Objective:   Physical Exam  Nursing note and vitals reviewed. Constitutional: He is oriented to person, place, and time. He appears well-developed and well-nourished.  HENT:  Head: Normocephalic and atraumatic.  Eyes: Conjunctivae are normal. Pupils are equal, round, and reactive to light.  Neck: Normal range of motion. Neck supple.  Cardiovascular: Normal rate and regular rhythm.   Pulmonary/Chest: Effort normal and breath sounds normal.  Abdominal: Soft. Bowel sounds are normal.  Musculoskeletal: He exhibits edema and tenderness.       The circulation to the leg looks good there is no signs of a DVT no Homans sign   Neurological: He is alert and oriented to person, place, and time.  Skin: Skin is warm and dry.          Assessment & Plan:   take the anti-inflammatory at bedtime for approximately 2 weeks when he began a program of walking and stretching.  Samples of blood pressure medicine given to him today continue the valsartan hydrochlorothiazide as prescribed   Weight loss is key to controlling arthritic pain   Continue the Crestor

## 2012-04-05 ENCOUNTER — Ambulatory Visit (INDEPENDENT_AMBULATORY_CARE_PROVIDER_SITE_OTHER): Payer: Medicare Other | Admitting: Internal Medicine

## 2012-04-05 ENCOUNTER — Encounter: Payer: Self-pay | Admitting: Internal Medicine

## 2012-04-05 VITALS — BP 152/78 | HR 64 | Temp 98.7°F | Resp 16 | Ht 70.0 in | Wt 224.0 lb

## 2012-04-05 DIAGNOSIS — E785 Hyperlipidemia, unspecified: Secondary | ICD-10-CM

## 2012-04-05 DIAGNOSIS — I10 Essential (primary) hypertension: Secondary | ICD-10-CM

## 2012-04-05 DIAGNOSIS — E669 Obesity, unspecified: Secondary | ICD-10-CM

## 2012-04-05 DIAGNOSIS — T887XXA Unspecified adverse effect of drug or medicament, initial encounter: Secondary | ICD-10-CM

## 2012-04-05 LAB — BASIC METABOLIC PANEL
CO2: 28 mEq/L (ref 19–32)
Calcium: 8.5 mg/dL (ref 8.4–10.5)
Chloride: 98 mEq/L (ref 96–112)
Creatinine, Ser: 1.1 mg/dL (ref 0.4–1.5)
Sodium: 138 mEq/L (ref 135–145)

## 2012-04-05 LAB — CBC WITH DIFFERENTIAL/PLATELET
Basophils Relative: 1.1 % (ref 0.0–3.0)
Eosinophils Relative: 3.8 % (ref 0.0–5.0)
Hemoglobin: 15.4 g/dL (ref 13.0–17.0)
Lymphocytes Relative: 30.9 % (ref 12.0–46.0)
MCHC: 33.6 g/dL (ref 30.0–36.0)
MCV: 94.1 fl (ref 78.0–100.0)
Neutro Abs: 2.9 10*3/uL (ref 1.4–7.7)
Neutrophils Relative %: 55.8 % (ref 43.0–77.0)
RBC: 4.87 Mil/uL (ref 4.22–5.81)
WBC: 5.3 10*3/uL (ref 4.5–10.5)

## 2012-04-05 NOTE — Progress Notes (Signed)
Subjective:    Patient ID: Robert Krause, male    DOB: [birth year], 78 y.o.   MRN: [id number]  HPI Patient's blood pressure is elevated but he has gradually gained weight I believe this is the etiology for the blood pressure being out of control He denies any chest pain shortness of breath PND orthopnea he has been compliant with his medications including his BUN is Crestor   Review of Systems  Constitutional: Negative for fever and fatigue.  HENT: Negative for hearing loss, congestion, neck pain and postnasal drip.   Eyes: Negative for discharge, redness and visual disturbance.  Respiratory: Negative for cough, shortness of breath and wheezing.   Cardiovascular: Negative for leg swelling.  Gastrointestinal: Negative for abdominal pain, constipation and abdominal distention.  Genitourinary: Negative for urgency and frequency.  Musculoskeletal: Negative for joint swelling and arthralgias.  Skin: Negative for color change and rash.  Neurological: Negative for weakness and light-headedness.  Hematological: Negative for adenopathy.  Psychiatric/Behavioral: Negative for behavioral problems.   Past Medical History  Diagnosis Date  . Hyperlipidemia   . Hypertension   . Chronic sinusitis   . Chronic interstitial cystitis   . Allergic rhinitis   . Osteoarthritis   . Anxiety     History   Social History  . Marital Status: Married    Spouse Name: N/A    Number of Children: N/A  . Years of Education: N/A   Occupational History  . Not on file.   Social History Main Topics  . Smoking status: Former Games developer  . Smokeless tobacco: Never Used  . Alcohol Use: 1.8 oz/week    3 Glasses of wine per week  . Drug Use: No  . Sexually Active: Not on file   Other Topics Concern  . Not on file   Social History Narrative  . No narrative on file    Past Surgical History  Procedure Date  . Transurethral resection of prostate   . Colonoscopy   . Polypectomy   . Appendectomy      Family History  Problem Relation Age of Onset  . Anxiety disorder    . Hyperlipidemia    . Hypertension      No Known Allergies  Current Outpatient Prescriptions on File Prior to Visit  Medication Sig Dispense Refill  . ALPRAZolam (XANAX) 0.25 MG tablet Take 1 tablet (0.25 mg total) by mouth every 8 (eight) hours as needed.  90 tablet  3  . Meth-Hyo-M Bl-Na Phos-Ph Sal (URIBEL) 118 MG CAPS Take 1 capsule (118 mg total) by mouth daily.  90 capsule  1  . rosuvastatin (CRESTOR) 5 MG tablet Take 5 mg by mouth daily.        . Tamsulosin HCl (FLOMAX) 0.4 MG CAPS Take 1 capsule by mouth Daily.      . timolol (TIMOPTIC) 0.5 % ophthalmic solution Place 1 drop into both eyes daily.      . valsartan-hydrochlorothiazide (DIOVAN HCT) 160-12.5 MG per tablet Take 1 tablet by mouth daily.          BP 152/78  Pulse 64  Temp 98.7 F (37.1 C)  Resp 16  Ht 5\' 10"  (1.778 m)  Wt 224 lb (101.606 kg)  BMI 32.14 kg/m2       Objective:   Physical Exam  Nursing note and vitals reviewed. Constitutional: He appears well-developed and well-nourished.  HENT:  Head: Normocephalic and atraumatic.  Eyes: Conjunctivae are normal. Pupils are equal, round, and reactive to light.  Neck:  Normal range of motion. Neck supple.  Cardiovascular: Normal rate and regular rhythm.   Pulmonary/Chest: Effort normal and breath sounds normal.  Abdominal: Soft. Bowel sounds are normal.          Assessment & Plan:  Discussed diet including the practical paleo diet plan Set weight loss goals for blood pressure control and for lipid management we'll monitor her CBC today for a history of anemia as well as a basic metabolic panel for blood pressure control urged exercise as well we'll set a followup in 3 months.

## 2012-04-05 NOTE — Patient Instructions (Signed)
"  practical paleo" 

## 2012-07-06 ENCOUNTER — Other Ambulatory Visit: Payer: Self-pay | Admitting: Internal Medicine

## 2012-07-06 ENCOUNTER — Encounter: Payer: Self-pay | Admitting: Internal Medicine

## 2012-07-06 ENCOUNTER — Ambulatory Visit (INDEPENDENT_AMBULATORY_CARE_PROVIDER_SITE_OTHER): Payer: Medicare Other | Admitting: Internal Medicine

## 2012-07-06 VITALS — BP 134/80 | HR 72 | Temp 98.6°F | Resp 16 | Ht 70.0 in | Wt 216.0 lb

## 2012-07-06 DIAGNOSIS — T887XXA Unspecified adverse effect of drug or medicament, initial encounter: Secondary | ICD-10-CM

## 2012-07-06 DIAGNOSIS — E785 Hyperlipidemia, unspecified: Secondary | ICD-10-CM

## 2012-07-06 DIAGNOSIS — N411 Chronic prostatitis: Secondary | ICD-10-CM

## 2012-07-06 DIAGNOSIS — I10 Essential (primary) hypertension: Secondary | ICD-10-CM

## 2012-07-06 MED ORDER — CIPROFLOXACIN HCL 500 MG PO TABS: 500.0000 mg | ORAL_TABLET | Freq: Two times a day (BID) | ORAL | Status: AC

## 2012-07-06 NOTE — Progress Notes (Signed)
Subjective:    Patient ID: Robert Krause, male    DOB: 1934-05-23, 78 y.o.   MRN: [id number]  HPI Follow up for HTN And GERD  reveiwed lab   Review of Systems  Constitutional: Negative for fever and fatigue.  HENT: Negative for hearing loss, congestion, neck pain and postnasal drip.   Eyes: Negative for discharge, redness and visual disturbance.  Respiratory: Negative for cough, shortness of breath and wheezing.   Cardiovascular: Negative for leg swelling.  Gastrointestinal: Negative for abdominal pain, constipation and abdominal distention.  Genitourinary: Negative for urgency and frequency.  Musculoskeletal: Negative for joint swelling and arthralgias.  Skin: Negative for color change and rash.  Neurological: Negative for weakness and light-headedness.  Hematological: Negative for adenopathy.  Psychiatric/Behavioral: Negative for behavioral problems.       Past Medical History  Diagnosis Date  . Hyperlipidemia   . Hypertension   . Chronic sinusitis   . Chronic interstitial cystitis   . Allergic rhinitis   . Osteoarthritis   . Anxiety     History   Social History  . Marital Status: Married    Spouse Name: N/A    Number of Children: N/A  . Years of Education: N/A   Occupational History  . Not on file.   Social History Main Topics  . Smoking status: Former Games developer  . Smokeless tobacco: Never Used  . Alcohol Use: 1.8 oz/week    3 Glasses of wine per week  . Drug Use: No  . Sexually Active: Not on file   Other Topics Concern  . Not on file   Social History Narrative  . No narrative on file    Past Surgical History  Procedure Date  . Transurethral resection of prostate   . Colonoscopy   . Polypectomy   . Appendectomy     Family History  Problem Relation Age of Onset  . Anxiety disorder    . Hyperlipidemia    . Hypertension      No Known Allergies  Current Outpatient Prescriptions on File Prior to Visit  Medication Sig Dispense Refill    . ALPRAZolam (XANAX) 0.25 MG tablet Take 1 tablet (0.25 mg total) by mouth every 8 (eight) hours as needed.  90 tablet  3  . rosuvastatin (CRESTOR) 5 MG tablet Take 5 mg by mouth daily.        . Tamsulosin HCl (FLOMAX) 0.4 MG CAPS Take 1 capsule by mouth Daily.      . timolol (TIMOPTIC) 0.5 % ophthalmic solution Place 1 drop into both eyes daily.      . valsartan-hydrochlorothiazide (DIOVAN HCT) 160-12.5 MG per tablet Take 1 tablet by mouth daily.          BP 134/80  Pulse 72  Temp 98.6 F (37 C)  Resp 16  Ht 5\' 10"  (1.778 m)  Wt 216 lb (97.977 kg)  BMI 30.99 kg/m2    Objective:   Physical Exam  Constitutional: He appears well-developed and well-nourished.  HENT:  Head: Normocephalic and atraumatic.  Eyes: Conjunctivae are normal. Pupils are equal, round, and reactive to light.  Neck: Normal range of motion. Neck supple.  Cardiovascular: Normal rate and regular rhythm.   Pulmonary/Chest: Effort normal and breath sounds normal.  Abdominal: Soft. Bowel sounds are normal.  Psychiatric: He has a normal mood and affect. His behavior is normal.          Assessment & Plan:  The Pt has HTN and hx of GERD Last visit  we drew a basic metabolic panel for monitoring of his hypertension on hypertensive medication specifically looking at renal function and potassium those levels were normal.  We also because of his history of chronic GERD get a CBC with differential to make sure that there was no erosive esophagitis or ulcerative disease and that CBC differential was normal.  The CBC also was drawn because of his history of chronic sinusitis to make sure his white cell count was not elevated.  I believe that there was a mistake at that time and the filing of his insurance apparently the patient says his insurance company thought these were submitted under obesity they should not have been submitted under obesity but under hypertension and hyperlipidemia this was an error.the lab and    should be resubmitted  Patient has lost over 8 pounds this is reflected in better blood pressure control.

## 2012-10-06 ENCOUNTER — Ambulatory Visit: Payer: Medicare Other | Admitting: Internal Medicine

## 2012-11-22 ENCOUNTER — Encounter: Payer: Self-pay | Admitting: Internal Medicine

## 2012-11-22 ENCOUNTER — Ambulatory Visit (INDEPENDENT_AMBULATORY_CARE_PROVIDER_SITE_OTHER): Payer: Medicare Other | Admitting: Internal Medicine

## 2012-11-22 VITALS — BP 130/80 | HR 72 | Temp 98.2°F | Resp 16 | Ht 70.0 in | Wt 216.0 lb

## 2012-11-22 DIAGNOSIS — R0600 Dyspnea, unspecified: Secondary | ICD-10-CM

## 2012-11-22 DIAGNOSIS — I10 Essential (primary) hypertension: Secondary | ICD-10-CM

## 2012-11-22 DIAGNOSIS — J449 Chronic obstructive pulmonary disease, unspecified: Secondary | ICD-10-CM

## 2012-11-22 DIAGNOSIS — R0989 Other specified symptoms and signs involving the circulatory and respiratory systems: Secondary | ICD-10-CM

## 2012-11-22 NOTE — Progress Notes (Signed)
Subjective:    Patient ID: Robert Krause, male    DOB: 1934-08-31, 78 y.o.   MRN: [id number]  HPI  Has chronic cough and SOB from the cough Has not reported orthopnea Has no hx of asthma    Review of Systems  Constitutional: Negative for fever and fatigue.  HENT: Positive for postnasal drip. Negative for hearing loss, congestion and neck pain.   Eyes: Negative for discharge, redness and visual disturbance.  Respiratory: Positive for cough and shortness of breath. Negative for wheezing.   Cardiovascular: Negative for leg swelling.  Gastrointestinal: Positive for nausea. Negative for abdominal pain, constipation and abdominal distention.       GERD  Genitourinary: Negative for urgency and frequency.  Musculoskeletal: Negative for joint swelling and arthralgias.  Skin: Negative for color change and rash.  Neurological: Negative for weakness and light-headedness.  Hematological: Negative for adenopathy.  Psychiatric/Behavioral: Negative for behavioral problems.   Past Medical History  Diagnosis Date  . Hyperlipidemia   . Hypertension   . Chronic sinusitis   . Chronic interstitial cystitis   . Allergic rhinitis   . Osteoarthritis   . Anxiety     History   Social History  . Marital Status: Married    Spouse Name: N/A    Number of Children: N/A  . Years of Education: N/A   Occupational History  . Not on file.   Social History Main Topics  . Smoking status: Former Games developer  . Smokeless tobacco: Never Used  . Alcohol Use: 1.8 oz/week    3 Glasses of wine per week  . Drug Use: No  . Sexually Active: Not on file   Other Topics Concern  . Not on file   Social History Narrative  . No narrative on file    Past Surgical History  Procedure Date  . Transurethral resection of prostate   . Colonoscopy   . Polypectomy   . Appendectomy     Family History  Problem Relation Age of Onset  . Anxiety disorder    . Hyperlipidemia    . Hypertension      No Known  Allergies  Current Outpatient Prescriptions on File Prior to Visit  Medication Sig Dispense Refill  . ALPRAZolam (XANAX) 0.25 MG tablet TAKE 1 TABLET BY MOUTH EVERY 8 HOURS AS NEEDED  90 tablet  0  . rosuvastatin (CRESTOR) 5 MG tablet Take 5 mg by mouth daily.        . Tamsulosin HCl (FLOMAX) 0.4 MG CAPS Take 1 capsule by mouth Daily.      . timolol (TIMOPTIC) 0.5 % ophthalmic solution Place 1 drop into both eyes daily.      . valsartan-hydrochlorothiazide (DIOVAN HCT) 160-12.5 MG per tablet Take 1 tablet by mouth daily.          BP 130/80  Pulse 72  Temp 98.2 F (36.8 C)  Resp 16  Ht 5\' 10"  (1.778 m)  Wt 216 lb (97.977 kg)  BMI 30.99 kg/m2        Objective:   Physical Exam  Nursing note and vitals reviewed. Constitutional: He is oriented to person, place, and time. He appears well-nourished.  HENT:  Head: Normocephalic and atraumatic.  Eyes: EOM are normal.       injected  Neck: Normal range of motion. Neck supple.  Cardiovascular: Normal rate and regular rhythm.   Murmur heard. Neurological: He is alert and oriented to person, place, and time.  Assessment & Plan:  Increased anxiety Patient has asthmatic bronchitis possible dual etiologies of allergy rhinitis and postnasal drip as well as GERD.  We will place him on an inhaled combination drug.  [name] We will also begin a proton pump inhibitor We'll get an echocardiogram for shortness of breath Followup in short order to make sure that this intervention alleviate C. shortness of breath

## 2012-11-22 NOTE — Patient Instructions (Signed)
Take the BREO  inhailer daily  Take prilosec one  A day

## 2012-11-29 ENCOUNTER — Ambulatory Visit (HOSPITAL_COMMUNITY): Payer: Medicare Other | Attending: Cardiology | Admitting: Radiology

## 2012-11-29 DIAGNOSIS — R0602 Shortness of breath: Secondary | ICD-10-CM

## 2012-11-29 DIAGNOSIS — I369 Nonrheumatic tricuspid valve disorder, unspecified: Secondary | ICD-10-CM | POA: Insufficient documentation

## 2012-11-29 DIAGNOSIS — R0600 Dyspnea, unspecified: Secondary | ICD-10-CM

## 2012-11-29 DIAGNOSIS — I10 Essential (primary) hypertension: Secondary | ICD-10-CM | POA: Insufficient documentation

## 2012-11-29 DIAGNOSIS — I379 Nonrheumatic pulmonary valve disorder, unspecified: Secondary | ICD-10-CM | POA: Insufficient documentation

## 2012-11-29 DIAGNOSIS — R0989 Other specified symptoms and signs involving the circulatory and respiratory systems: Secondary | ICD-10-CM | POA: Insufficient documentation

## 2012-11-29 DIAGNOSIS — R0609 Other forms of dyspnea: Secondary | ICD-10-CM | POA: Insufficient documentation

## 2012-11-29 NOTE — Progress Notes (Signed)
Echocardiogram performed.  

## 2012-12-03 ENCOUNTER — Telehealth: Payer: Self-pay | Admitting: Internal Medicine

## 2012-12-03 NOTE — Telephone Encounter (Signed)
All labs wnl and echo is ok per drjenkins and pt informed

## 2012-12-03 NOTE — Telephone Encounter (Signed)
Pt would like you to call him with results of his lab work.

## 2013-01-08 ENCOUNTER — Other Ambulatory Visit: Payer: Self-pay

## 2013-02-16 ENCOUNTER — Ambulatory Visit (INDEPENDENT_AMBULATORY_CARE_PROVIDER_SITE_OTHER): Payer: Medicare Other | Admitting: Internal Medicine

## 2013-02-16 ENCOUNTER — Encounter: Payer: Self-pay | Admitting: Internal Medicine

## 2013-02-16 VITALS — BP 128/70 | HR 70 | Temp 97.4°F | Wt 218.0 lb

## 2013-02-16 DIAGNOSIS — I10 Essential (primary) hypertension: Secondary | ICD-10-CM

## 2013-02-16 DIAGNOSIS — E785 Hyperlipidemia, unspecified: Secondary | ICD-10-CM

## 2013-02-16 DIAGNOSIS — N410 Acute prostatitis: Secondary | ICD-10-CM

## 2013-02-16 MED ORDER — OLMESARTAN MEDOXOMIL-HCTZ 20-12.5 MG PO TABS: 1.0000 | ORAL_TABLET | Freq: Every day | ORAL | Status: DC

## 2013-02-16 NOTE — Progress Notes (Signed)
Subjective:    Patient ID: Robert Krause, male    DOB: 1934/04/14, 79 y.o.   MRN: [id number]  HPI  Patient presents today for followup of hyperlipidemia and hypertension.  We have transferred him from DNA and to Benicar and his blood pressure is excellent.  We will continue Benicar 20/12.5 Increased andxiety  Review of Systems  Constitutional: Negative for fever and fatigue.  HENT: Negative for hearing loss, congestion, neck pain and postnasal drip.   Eyes: Negative for discharge, redness and visual disturbance.  Respiratory: Positive for chest tightness and shortness of breath. Negative for cough and wheezing.   Cardiovascular: Negative for leg swelling.  Gastrointestinal: Negative for abdominal pain, constipation and abdominal distention.  Genitourinary: Negative for urgency and frequency.  Musculoskeletal: Negative for joint swelling and arthralgias.  Skin: Negative for color change and rash.  Neurological: Negative for weakness and light-headedness.  Hematological: Negative for adenopathy.  Psychiatric/Behavioral: Negative for behavioral problems.   Past Medical History  Diagnosis Date  . Hyperlipidemia   . Hypertension   . Chronic sinusitis   . Chronic interstitial cystitis   . Allergic rhinitis   . Osteoarthritis   . Anxiety     History   Social History  . Marital Status: Married    Spouse Name: N/A    Number of Children: N/A  . Years of Education: N/A   Occupational History  . Not on file.   Social History Main Topics  . Smoking status: Former Games developer  . Smokeless tobacco: Never Used  . Alcohol Use: 1.8 oz/week    3 Glasses of wine per week  . Drug Use: No  . Sexually Active: Not on file   Other Topics Concern  . Not on file   Social History Narrative  . No narrative on file    Past Surgical History  Procedure Laterality Date  . Transurethral resection of prostate    . Colonoscopy    . Polypectomy    . Appendectomy      Family History   Problem Relation Age of Onset  . Anxiety disorder    . Hyperlipidemia    . Hypertension      No Known Allergies  Current Outpatient Prescriptions on File Prior to Visit  Medication Sig Dispense Refill  . ALPRAZolam (XANAX) 0.25 MG tablet TAKE 1 TABLET BY MOUTH EVERY 8 HOURS AS NEEDED  90 tablet  0  . rosuvastatin (CRESTOR) 5 MG tablet Take 5 mg by mouth daily.        . Tamsulosin HCl (FLOMAX) 0.4 MG CAPS Take 1 capsule by mouth Daily.      . timolol (TIMOPTIC) 0.5 % ophthalmic solution Place 1 drop into both eyes daily.      . valsartan-hydrochlorothiazide (DIOVAN HCT) 160-12.5 MG per tablet Take 1 tablet by mouth daily.         No current facility-administered medications on file prior to visit.    BP 128/70  Pulse 70  Temp(Src) 97.4 F (36.3 C) (Oral)  Wt 218 lb (98.884 kg)  BMI 31.28 kg/m2        Objective:   Physical Exam  Nursing note and vitals reviewed. Constitutional: He appears well-developed and well-nourished.  HENT:  Head: Normocephalic and atraumatic.  Eyes: Conjunctivae are normal. Pupils are equal, round, and reactive to light.  Neck: Normal range of motion. Neck supple.  Cardiovascular: Normal rate and regular rhythm.   Murmur heard. Pulmonary/Chest: Effort normal and breath sounds normal.  Abdominal: Soft. Bowel  sounds are normal.  Genitourinary: Prostate normal.  Skin: Skin is warm and dry.  Psychiatric: He has a normal mood and affect. His behavior is normal.          Assessment & Plan:  Continue Benicar 20/12.5 Samples of Crestor and blood pressure medicine given him He is due lipids and liver and bmet today Echocardiogram was normal with normal heart function Blood pressure is very stable on his current medication Anxiety is a primary issue

## 2013-04-03 ENCOUNTER — Other Ambulatory Visit: Payer: Self-pay | Admitting: Internal Medicine

## 2013-05-23 ENCOUNTER — Encounter: Payer: Medicare Other | Admitting: Family Medicine

## 2013-05-23 NOTE — Progress Notes (Signed)
Error   This encounter was created in error - please disregard. 

## 2013-05-24 ENCOUNTER — Encounter: Payer: Self-pay | Admitting: Family Medicine

## 2013-05-24 ENCOUNTER — Ambulatory Visit (INDEPENDENT_AMBULATORY_CARE_PROVIDER_SITE_OTHER): Payer: Medicare Other | Admitting: Family Medicine

## 2013-05-24 VITALS — BP 120/80 | Temp 97.6°F | Wt 218.0 lb

## 2013-05-24 DIAGNOSIS — T148 Other injury of unspecified body region: Secondary | ICD-10-CM

## 2013-05-24 DIAGNOSIS — W57XXXA Bitten or stung by nonvenomous insect and other nonvenomous arthropods, initial encounter: Secondary | ICD-10-CM

## 2013-05-24 DIAGNOSIS — I10 Essential (primary) hypertension: Secondary | ICD-10-CM

## 2013-05-24 NOTE — Progress Notes (Signed)
Chief Complaint  Patient presents with  . Tick Removal    HPI:  Insect Bite: -found tick on him last week - removed it -denies: fevers, chills, rash, HA, malaise  HTN: -running out of Benicar HCT 40/25 - takes 1/2 tablet dialy, given to im by PCP -has follow up with PCP next week  ROS: See pertinent positives and negatives per HPI.  Past Medical History  Diagnosis Date  . Hyperlipidemia   . Hypertension   . Chronic sinusitis   . Chronic interstitial cystitis   . Allergic rhinitis   . Osteoarthritis   . Anxiety     Family History  Problem Relation Age of Onset  . Anxiety disorder    . Hyperlipidemia    . Hypertension      History   Social History  . Marital Status: Married    Spouse Name: N/A    Number of Children: N/A  . Years of Education: N/A   Social History Main Topics  . Smoking status: Former Games developer  . Smokeless tobacco: Never Used  . Alcohol Use: 1.8 oz/week    3 Glasses of wine per week  . Drug Use: No  . Sexually Active: None   Other Topics Concern  . None   Social History Narrative  . None    Current outpatient prescriptions:ALPRAZolam (XANAX) 0.25 MG tablet, TAKE 1 TABLET BY MOUTH EVERY 8 HOURS AS NEEDED, Disp: 90 tablet, Rfl: 0;  olmesartan-hydrochlorothiazide (BENICAR HCT) 20-12.5 MG per tablet, Take 1 tablet by mouth daily., Disp: , Rfl: ;  rosuvastatin (CRESTOR) 5 MG tablet, Take 5 mg by mouth daily.  , Disp: , Rfl: ;  Tamsulosin HCl (FLOMAX) 0.4 MG CAPS, Take 1 capsule by mouth Daily., Disp: , Rfl:  timolol (TIMOPTIC) 0.5 % ophthalmic solution, Place 1 drop into both eyes daily., Disp: , Rfl:   EXAM:  Filed Vitals:   05/24/13 0754  BP: 120/80  Temp: 97.6 F (36.4 C)    Body mass index is 31.28 kg/(m^2).  GENERAL: vitals reviewed and listed above, alert, oriented, appears well hydrated and in no acute distress  HEENT: atraumatic, conjunttiva clear, no obvious abnormalities on inspection of external nose and ears  NECK: no  obvious masses on inspection  LUNGS: clear to auscultation bilaterally, no wheezes, rales or rhonchi, good air movement  CV: HRRR, no peripheral edema  MS: moves all extremities without noticeable abnormality  SKIN: erythematous papule on back, no signs of infection  PSYCH: pleasant and cooperative, no obvious depression or anxiety  ASSESSMENT AND PLAN:  Discussed the following assessment and plan:  Tick bite -no signs or symptoms of infection or tick borne illness -advised of signs symptoms of tick borne illness, return precautions, wound care  HYPERTENSION -samples given -he did not want refill as reports he will discuss with PCP at scheduled follow up  -Patient advised to return or notify a doctor immediately if symptoms worsen or persist or new concerns arise.  There are no Patient Instructions on file for this visit.   [name].

## 2013-06-06 ENCOUNTER — Ambulatory Visit (INDEPENDENT_AMBULATORY_CARE_PROVIDER_SITE_OTHER): Payer: Medicare Other | Admitting: Internal Medicine

## 2013-06-06 ENCOUNTER — Encounter: Payer: Self-pay | Admitting: Internal Medicine

## 2013-06-06 VITALS — BP 130/80 | HR 68 | Temp 98.2°F | Resp 16 | Ht 70.0 in | Wt 218.0 lb

## 2013-06-06 DIAGNOSIS — R739 Hyperglycemia, unspecified: Secondary | ICD-10-CM

## 2013-06-06 DIAGNOSIS — I10 Essential (primary) hypertension: Secondary | ICD-10-CM

## 2013-06-06 DIAGNOSIS — E785 Hyperlipidemia, unspecified: Secondary | ICD-10-CM

## 2013-06-06 DIAGNOSIS — Z0000 Encounter for general adult medical examination without abnormal findings: Secondary | ICD-10-CM

## 2013-06-06 DIAGNOSIS — R7309 Other abnormal glucose: Secondary | ICD-10-CM

## 2013-06-06 DIAGNOSIS — Z136 Encounter for screening for cardiovascular disorders: Secondary | ICD-10-CM

## 2013-06-06 LAB — BASIC METABOLIC PANEL
BUN: 14 mg/dL (ref 6–23)
CO2: 30 mEq/L (ref 19–32)
Chloride: 102 mEq/L (ref 96–112)
Creatinine, Ser: 1 mg/dL (ref 0.4–1.5)
Glucose, Bld: 97 mg/dL (ref 70–99)
Potassium: 4.6 mEq/L (ref 3.5–5.1)

## 2013-06-06 LAB — LDL CHOLESTEROL, DIRECT: Direct LDL: 153.4 mg/dL

## 2013-06-06 LAB — HEMOGLOBIN A1C: Hgb A1c MFr Bld: 5.7 % (ref 4.6–6.5)

## 2013-06-06 LAB — LIPID PANEL
Cholesterol: 211 mg/dL — ABNORMAL HIGH (ref 0–200)
Total CHOL/HDL Ratio: 5
Triglycerides: 93 mg/dL (ref 0.0–149.0)

## 2013-06-06 LAB — PSA: PSA: 0.77 ng/mL (ref 0.10–4.00)

## 2013-06-06 LAB — CBC WITH DIFFERENTIAL/PLATELET
Basophils Relative: 0.9 % (ref 0.0–3.0)
Eosinophils Relative: 4.9 % (ref 0.0–5.0)
MCV: 94.9 fl (ref 78.0–100.0)
Monocytes Relative: 8.5 % (ref 3.0–12.0)
Neutrophils Relative %: 55.9 % (ref 43.0–77.0)
Platelets: 198 10*3/uL (ref 150.0–400.0)
RBC: 4.9 Mil/uL (ref 4.22–5.81)
WBC: 7.1 10*3/uL (ref 4.5–10.5)

## 2013-06-06 LAB — POCT URINALYSIS DIPSTICK
Bilirubin, UA: NEGATIVE
Blood, UA: NEGATIVE
Glucose, UA: NEGATIVE
Ketones, UA: NEGATIVE
Leukocytes, UA: NEGATIVE
Nitrite, UA: NEGATIVE
pH, UA: 6

## 2013-06-06 LAB — TSH: TSH: 1.45 u[IU]/mL (ref 0.35–5.50)

## 2013-06-06 LAB — HEPATIC FUNCTION PANEL
ALT: 18 U/L (ref 0–53)
AST: 17 U/L (ref 0–37)
Total Protein: 7 g/dL (ref 6.0–8.3)

## 2013-06-06 LAB — URIC ACID: Uric Acid, Serum: 5.8 mg/dL (ref 4.0–7.8)

## 2013-06-06 NOTE — Progress Notes (Signed)
Subjective:    Patient ID: Robert Krause, male    DOB: 1934-06-02, 79 y.o.   MRN: [id number]  HPI Presents for a wellness examination.  He has a history of hypertension history of hyperlipidemia history of benign prostatic hypertrophy on Flomax.  He also has a history of a chronic reflux and cough    Review of Systems  Constitutional: Negative for fever and fatigue.  HENT: Negative for hearing loss, congestion, neck pain and postnasal drip.   Eyes: Negative for discharge, redness and visual disturbance.  Respiratory: Negative for cough, shortness of breath and wheezing.   Cardiovascular: Negative for leg swelling.  Gastrointestinal: Negative for abdominal pain, constipation and abdominal distention.  Genitourinary: Negative for urgency and frequency.  Musculoskeletal: Negative for joint swelling and arthralgias.  Skin: Negative for color change and rash.  Neurological: Negative for weakness and light-headedness.  Hematological: Negative for adenopathy.  Psychiatric/Behavioral: Negative for behavioral problems.   Past Medical History  Diagnosis Date  . Hyperlipidemia   . Hypertension   . Chronic sinusitis   . Chronic interstitial cystitis   . Allergic rhinitis   . Osteoarthritis   . Anxiety     History   Social History  . Marital Status: Married    Spouse Name: N/A    Number of Children: N/A  . Years of Education: N/A   Occupational History  . Not on file.   Social History Main Topics  . Smoking status: Former Games developer  . Smokeless tobacco: Never Used  . Alcohol Use: 1.8 oz/week    3 Glasses of wine per week  . Drug Use: No  . Sexually Active: Not on file   Other Topics Concern  . Not on file   Social History Narrative  . No narrative on file    Past Surgical History  Procedure Laterality Date  . Transurethral resection of prostate    . Colonoscopy    . Polypectomy    . Appendectomy      Family History  Problem Relation Age of Onset  .  Anxiety disorder    . Hyperlipidemia    . Hypertension      No Known Allergies  Current Outpatient Prescriptions on File Prior to Visit  Medication Sig Dispense Refill  . ALPRAZolam (XANAX) 0.25 MG tablet TAKE 1 TABLET BY MOUTH EVERY 8 HOURS AS NEEDED  90 tablet  0  . olmesartan-hydrochlorothiazide (BENICAR HCT) 20-12.5 MG per tablet Take 1 tablet by mouth daily.      . rosuvastatin (CRESTOR) 5 MG tablet Take 5 mg by mouth daily.        . Tamsulosin HCl (FLOMAX) 0.4 MG CAPS Take 1 capsule by mouth Daily.      . timolol (TIMOPTIC) 0.5 % ophthalmic solution Place 1 drop into both eyes daily.       No current facility-administered medications on file prior to visit.    BP 130/80  Pulse 68  Temp(Src) 98.2 F (36.8 C)  Resp 16  Ht 5\' 10"  (1.778 m)  Wt 218 lb (98.884 kg)  BMI 31.28 kg/m2       Objective:   Physical Exam  Constitutional: He is oriented to person, place, and time. He appears well-developed and well-nourished.  HENT:  Head: Normocephalic and atraumatic.  Eyes: Conjunctivae are normal. Pupils are equal, round, and reactive to light.  Neck: Normal range of motion. Neck supple.  Cardiovascular: Normal rate and regular rhythm.   Pulmonary/Chest: Effort normal and breath sounds normal.  Abdominal: Soft. Bowel sounds are normal.  Genitourinary:  Enlarged prostate approximately 60 g  Musculoskeletal: He exhibits edema and tenderness.  Neurological: He is oriented to person, place, and time.  Skin: Skin is warm and dry.          Assessment & Plan:   Patient presents for yearly preventative medicine examination.   all immunizations and health maintenance protocols were reviewed with the patient and they are up to date with these protocols.   screening laboratory values were reviewed with the patient including screening of hyperlipidemia PSA renal function and hepatic function.   There medications past medical history social history problem list and allergies  were reviewed in detail.   Goals were established with regard to weight loss exercise diet in compliance with medications  Recent elevations in blood glucoses tested with his wife's glucometer indicated risk for diabetes he has hypertension and hyperlipidemia as well as a weight reflected in a BMI greater than 35  Cream for diabetes todayMonitoring blood work for his hypertension on a diuretic and ARB class drugs monitoring lipid and liver on Crestor monitoring CBC with a history of reflux esophagitis PSA should be checked with a history of BPH with obstructive symptoms Vitamin D should be checked with a history of low vitamin D

## 2013-06-06 NOTE — Patient Instructions (Addendum)
The patient is instructed to continue all medications as prescribed. Schedule followup with check out clerk upon leaving the clinic  

## 2013-11-25 ENCOUNTER — Other Ambulatory Visit: Payer: Self-pay | Admitting: Internal Medicine

## 2013-12-07 ENCOUNTER — Encounter: Payer: Self-pay | Admitting: Internal Medicine

## 2013-12-07 ENCOUNTER — Ambulatory Visit (INDEPENDENT_AMBULATORY_CARE_PROVIDER_SITE_OTHER): Payer: Medicare Other | Admitting: Internal Medicine

## 2013-12-07 VITALS — BP 120/78 | HR 72 | Temp 98.3°F | Resp 16 | Ht 70.0 in | Wt 220.0 lb

## 2013-12-07 DIAGNOSIS — R059 Cough, unspecified: Secondary | ICD-10-CM

## 2013-12-07 DIAGNOSIS — H409 Unspecified glaucoma: Secondary | ICD-10-CM

## 2013-12-07 DIAGNOSIS — I10 Essential (primary) hypertension: Secondary | ICD-10-CM

## 2013-12-07 DIAGNOSIS — R053 Chronic cough: Secondary | ICD-10-CM

## 2013-12-07 DIAGNOSIS — R05 Cough: Secondary | ICD-10-CM

## 2013-12-07 MED ORDER — TIMOLOL MALEATE 0.5 % OP SOLN: 1.0000 [drp] | Freq: Every day | OPHTHALMIC | Status: DC

## 2013-12-07 MED ORDER — IRBESARTAN-HYDROCHLOROTHIAZIDE 150-12.5 MG PO TABS: 1.0000 | ORAL_TABLET | Freq: Every day | ORAL | Status: DC

## 2013-12-07 NOTE — Patient Instructions (Signed)
Replace the benicar with the new medication

## 2013-12-07 NOTE — Progress Notes (Signed)
Subjective:    Patient ID: Robert Krause, male    DOB: Dec 08, 1933, 80 y.o.   MRN: [id number]  HPI COPD stable Medication management he has tier 3 medications and the cost is prohibitive Has been using eye drops for glaucoma Needs a refill  On eye medication     Review of Systems  Constitutional: Negative for fever and fatigue.  HENT: Negative for congestion, hearing loss and postnasal drip.   Eyes: Negative for discharge, redness and visual disturbance.  Respiratory: Negative for cough, shortness of breath and wheezing.   Cardiovascular: Negative for leg swelling.  Gastrointestinal: Negative for abdominal pain, constipation and abdominal distention.  Genitourinary: Negative for urgency and frequency.  Musculoskeletal: Negative for arthralgias, joint swelling and neck pain.  Skin: Negative for color change and rash.  Neurological: Negative for weakness and light-headedness.  Hematological: Negative for adenopathy.  Psychiatric/Behavioral: Negative for behavioral problems.   Past Medical History  Diagnosis Date  . Hyperlipidemia   . Hypertension   . Chronic sinusitis   . Chronic interstitial cystitis   . Allergic rhinitis   . Osteoarthritis   . Anxiety     History   Social History  . Marital Status: Married    Spouse Name: N/A    Number of Children: N/A  . Years of Education: N/A   Occupational History  . Not on file.   Social History Main Topics  . Smoking status: Former Research scientist (life sciences)  . Smokeless tobacco: Never Used  . Alcohol Use: 1.8 oz/week    3 Glasses of wine per week  . Drug Use: No  . Sexual Activity: Not on file   Other Topics Concern  . Not on file   Social History Narrative  . No narrative on file    Past Surgical History  Procedure Laterality Date  . Transurethral resection of prostate    . Colonoscopy    . Polypectomy    . Appendectomy      Family History  Problem Relation Age of Onset  . Anxiety disorder    . Hyperlipidemia    .  Hypertension      No Known Allergies  Current Outpatient Prescriptions on File Prior to Visit  Medication Sig Dispense Refill  . ALPRAZolam (XANAX) 0.25 MG tablet TAKE 1 TABLET BY MOUTH EVERY 8 HOURS AS NEEDED  90 tablet  3  . rosuvastatin (CRESTOR) 5 MG tablet Take 5 mg by mouth daily.        . Tamsulosin HCl (FLOMAX) 0.4 MG CAPS Take 1 capsule by mouth Daily.       No current facility-administered medications on file prior to visit.    BP 120/78  Pulse 72  Temp(Src) 98.3 F (36.8 C)  Resp 16  Ht 5\' 10"  (1.778 m)  Wt 220 lb (99.791 kg)  BMI 31.57 kg/m2        Objective:   Physical Exam  Constitutional: He appears well-developed and well-nourished.  HENT:  Head: Normocephalic and atraumatic.  Eyes: Conjunctivae are normal. Pupils are equal, round, and reactive to light.  Neck: Normal range of motion. Neck supple.  Cardiovascular: Normal rate and regular rhythm.   Murmur heard. Pulmonary/Chest: Effort normal and breath sounds normal.  Abdominal: Soft. Bowel sounds are normal.          Assessment & Plan:  Stable HTN but needs to change medications due to tier status Discussed new drug choice  I have spent more than 30 minutes examining this patient face-to-face of  which over half was spent in counseling Stable COPD / cough

## 2013-12-28 ENCOUNTER — Telehealth: Payer: Self-pay | Admitting: Internal Medicine

## 2013-12-28 NOTE — Telephone Encounter (Signed)
Relevant patient education mailed to patient.  

## 2014-03-16 ENCOUNTER — Encounter: Payer: Self-pay | Admitting: Internal Medicine

## 2014-03-27 ENCOUNTER — Other Ambulatory Visit: Payer: Self-pay

## 2014-03-27 ENCOUNTER — Other Ambulatory Visit: Payer: Self-pay | Admitting: Internal Medicine

## 2014-03-27 MED ORDER — ALPRAZOLAM 0.25 MG PO TABS: ORAL_TABLET | ORAL | Status: DC

## 2014-06-30 ENCOUNTER — Encounter: Payer: Medicare Other | Admitting: Internal Medicine

## 2014-07-18 ENCOUNTER — Ambulatory Visit (INDEPENDENT_AMBULATORY_CARE_PROVIDER_SITE_OTHER): Payer: Medicare Other | Admitting: Internal Medicine

## 2014-07-18 ENCOUNTER — Encounter: Payer: Self-pay | Admitting: Internal Medicine

## 2014-07-18 VITALS — BP 132/68 | HR 46 | Temp 97.9°F | Ht 69.0 in | Wt 222.0 lb

## 2014-07-18 DIAGNOSIS — Z0000 Encounter for general adult medical examination without abnormal findings: Secondary | ICD-10-CM | POA: Insufficient documentation

## 2014-07-18 DIAGNOSIS — N3010 Interstitial cystitis (chronic) without hematuria: Secondary | ICD-10-CM

## 2014-07-18 DIAGNOSIS — R7309 Other abnormal glucose: Secondary | ICD-10-CM

## 2014-07-18 DIAGNOSIS — Z23 Encounter for immunization: Secondary | ICD-10-CM

## 2014-07-18 DIAGNOSIS — E785 Hyperlipidemia, unspecified: Secondary | ICD-10-CM

## 2014-07-18 DIAGNOSIS — R739 Hyperglycemia, unspecified: Secondary | ICD-10-CM

## 2014-07-18 DIAGNOSIS — I10 Essential (primary) hypertension: Secondary | ICD-10-CM

## 2014-07-18 HISTORY — DX: Encounter for general adult medical examination without abnormal findings: Z00.00

## 2014-07-18 LAB — LIPID PANEL
Cholesterol: 205 mg/dL — ABNORMAL HIGH (ref 0–200)
HDL: 44.4 mg/dL (ref >39.00)
LDL Cholesterol: 135 mg/dL — ABNORMAL HIGH (ref 0–99)
NONHDL: 160.6
Total CHOL/HDL Ratio: 5
Triglycerides: 127 mg/dL (ref 0.0–149.0)
VLDL: 25.4 mg/dL (ref 0.0–40.0)

## 2014-07-18 LAB — COMPREHENSIVE METABOLIC PANEL
ALT: 18 U/L (ref 0–53)
AST: 19 U/L (ref 0–37)
Albumin: 3.5 g/dL (ref 3.5–5.2)
Alkaline Phosphatase: 55 U/L (ref 39–117)
BILIRUBIN TOTAL: 0.8 mg/dL (ref 0.2–1.2)
BUN: 15 mg/dL (ref 6–23)
CALCIUM: 8.5 mg/dL (ref 8.4–10.5)
CHLORIDE: 103 meq/L (ref 96–112)
CO2: 28 meq/L (ref 19–32)
Creatinine, Ser: 1.1 mg/dL (ref 0.4–1.5)
GFR: 70.33 mL/min (ref >60.00)
Glucose, Bld: 99 mg/dL (ref 70–99)
Potassium: 4.6 mEq/L (ref 3.5–5.1)
Sodium: 135 mEq/L (ref 135–145)
Total Protein: 6.8 g/dL (ref 6.0–8.3)

## 2014-07-18 LAB — HEMOGLOBIN A1C: Hgb A1c MFr Bld: 5.7 % (ref 4.6–6.5)

## 2014-07-18 NOTE — Assessment & Plan Note (Addendum)
Recently self d/c crestor d/t aches, reports lipitor intolerance Plan-- labs , Further advise w/ results, pravachol?

## 2014-07-18 NOTE — Assessment & Plan Note (Signed)
Per urology, Dr Rosana Hoes

## 2014-07-18 NOTE — Progress Notes (Signed)
Pre-visit discussion using our clinic review tool. No additional management support is needed unless otherwise documented below in the visit note.  

## 2014-07-18 NOTE — Patient Instructions (Signed)
Get your blood work before you leave     Next visit is for routine check up regards your  blood pressure in 6 months,  fasting Please make an appointment     Fall Prevention and [employer] cause injuries and can affect all age groups. It is possible to use preventive measures to significantly decrease the likelihood of falls. There are many simple measures which can make your home safer and prevent falls. OUTDOORS  Repair cracks and edges of walkways and driveways.  Remove high doorway thresholds.  Trim shrubbery on the main path into your home.  Have good outside lighting.  Clear walkways of tools, rocks, debris, and clutter.  Check that handrails are not broken and are securely fastened. Both sides of steps should have handrails.  Have leaves, snow, and ice cleared regularly.  Use sand or salt on walkways during winter months.  In the garage, clean up grease or oil spills. BATHROOM  Install night lights.  Install grab bars by the toilet and in the tub and shower.  Use non-skid mats or decals in the tub or shower.  Place a plastic non-slip stool in the shower to sit on, if needed.  Keep floors dry and clean up all water on the floor immediately.  Remove soap buildup in the tub or shower on a regular basis.  Secure bath mats with non-slip, double-sided rug tape.  Remove throw rugs and tripping hazards from the floors. BEDROOMS  Install night lights.  Make sure a bedside light is easy to reach.  Do not use oversized bedding.  Keep a telephone by your bedside.  Have a firm chair with side arms to use for getting dressed.  Remove throw rugs and tripping hazards from the floor. KITCHEN  Keep handles on pots and pans turned toward the center of the stove. Use back burners when possible.  Clean up spills quickly and allow time for drying.  Avoid walking on wet floors.  Avoid hot utensils and knives.  Position shelves so they are not too high or  low.  Place commonly used objects within easy reach.  If necessary, use a sturdy step stool with a grab bar when reaching.  Keep electrical cables out of the way.  Do not use floor polish or wax that makes floors slippery. If you must use wax, use non-skid floor wax.  Remove throw rugs and tripping hazards from the floor. STAIRWAYS  Never leave objects on stairs.  Place handrails on both sides of stairways and use them. Fix any loose handrails. Make sure handrails on both sides of the stairways are as long as the stairs.  Check carpeting to make sure it is firmly attached along stairs. Make repairs to worn or loose carpet promptly.  Avoid placing throw rugs at the top or bottom of stairways, or properly secure the rug with carpet tape to prevent slippage. Get rid of throw rugs, if possible.  Have an electrician put in a light switch at the top and bottom of the stairs. OTHER FALL PREVENTION TIPS  Wear low-heel or rubber-soled shoes that are supportive and fit well. Wear closed toe shoes.  When using a stepladder, make sure it is fully opened and both spreaders are firmly locked. Do not climb a closed stepladder.  Add color or contrast paint or tape to grab bars and handrails in your home. Place contrasting color strips on first and last steps.  Learn and use mobility aids as needed. Install an Dealer  emergency response system.  Turn on lights to avoid dark areas. Replace light bulbs that burn out immediately. Get light switches that glow.  Arrange furniture to create clear pathways. Keep furniture in the same place.  Firmly attach carpet with non-skid or double-sided tape.  Eliminate uneven floor surfaces.  Select a carpet pattern that does not visually hide the edge of steps.  Be aware of all pets. OTHER HOME SAFETY TIPS  Set the water temperature for 120 F (48.8 C).  Keep emergency numbers on or near the telephone.  Keep smoke detectors on every level of the  home and near sleeping areas. Document Released: 10/31/2002 Document Revised: 05/11/2012 Document Reviewed: 01/30/2012 [facility] Patient Information 2015 [place], [place]. This information is not intended to replace advice given to you by your health care provider. Make sure you discuss any questions you have with your health care provider.

## 2014-07-18 NOTE — Assessment & Plan Note (Addendum)
Td 2007 pnm 23-- 2004 prevanr today zostavax-- 2014 per pt @ his pharmacy  Last cscope 2012, several polyps, next per GI Discussed diet-exercise

## 2014-07-18 NOTE — Progress Notes (Signed)
Subjective:    Patient ID: Robert Krause, male    DOB: 1934/10/23, 80 y.o.   MRN: [id number]  DOS:  07/18/2014 Type of visit - description: transferring from Dr Arnoldo Morale Here for Medicare AWV:  1. Risk factors based on Past M, S, F history: reviewed 2. Physical Activities:  Walks most days, 2 miles  3. Depression/mood: no depression, mild anxiety 4. Hearing:  No problemss noted or reported  5. ADL's: lives w/ wife, independent  60. Fall Risk: no recent falls, precautions discussed   7. home Safety: does feel safe at home  8. Height, weight, & visual acuity: see VS, poor vision,sees eye MD regulalrly 9. Counseling: provided 10. Labs ordered based on risk factors: if needed  11. Referral Coordination: if needed 12. Care Plan, see assessment and plan  13. Cognitive Assessment: motor and cognition appropriate for age   In addition, today we discussed the following: HTN-- good  Med compliance, amb BPs 130s. Anxiety-- on xanax only, sx well controlled High chol-- self d/c crestor d/t aches  BPH-- on flomax, sx relatively well controlled   ROS No  CP, SOB No palpitations, mild lower extremity edema Denies  nausea, vomiting diarrhea, blood in the stools (-) cough, sputum production (-) wheezing, chest congestion No  gross hematuria, some difficulty urinating  No headaches. Denies dizziness   Past Medical History  Diagnosis Date  . Hyperlipidemia   . Hypertension   . Chronic sinusitis   . Chronic interstitial cystitis     Dr Amalia Hailey  . Allergic rhinitis   . Osteoarthritis   . Anxiety   . Glaucoma   . BPH (benign prostatic hyperplasia)     Past Surgical History  Procedure Laterality Date  . Transurethral resection of prostate    . Colonoscopy    . Polypectomy    . Appendectomy      History   Social History  . Marital Status: Married    Spouse Name: N/A    Number of Children: 1  . Years of Education: N/A   Occupational History  . retired- [employer]    Social  History Main Topics  . Smoking status: Never Smoker   . Smokeless tobacco: Never Used  . Alcohol Use: 1.8 oz/week    3 Glasses of wine per week  . Drug Use: No  . Sexual Activity: Not on file   Other Topics Concern  . Not on file   Social History Narrative   Born in [place], raised in [place], moved to [place] in the 62     Family History  Problem Relation Age of Onset  . Anxiety disorder Other   . Hyperlipidemia Father   . Hypertension Father   . CAD Father   . Colon cancer Neg Hx   . Prostate cancer Neg Hx       Medication List       This list is accurate as of: 07/18/14 11:59 PM.  Always use your most recent med list.               ALPRAZolam 0.25 MG tablet  Commonly known as:  XANAX  TAKE 1 TABLET BY MOUTH EVERY 8 HOURS AS NEEDED     irbesartan-hydrochlorothiazide 150-12.5 MG per tablet  Commonly known as:  AVALIDE  Take 1 tablet by mouth daily.     tamsulosin 0.4 MG Caps capsule  Commonly known as:  FLOMAX  Take 1 capsule by mouth Daily.     timolol 0.5 %  ophthalmic solution  Commonly known as:  TIMOPTIC  Place 1 drop into both eyes daily.           Objective:   Physical Exam BP 132/68  Pulse 46  Temp(Src) 97.9 F (36.6 C) (Oral)  Ht 5\' 9"  (1.753 m)  Wt 222 lb (100.699 kg)  BMI 32.77 kg/m2  SpO2 97% General -- alert, well-developed, NAD.  Neck --no thyromegaly   HEENT-- Not pale.  Lungs -- normal respiratory effort, no intercostal retractions, no accessory muscle use, and normal breath sounds.  Heart-- normal rate, regular rhythm, no murmur.  Abdomen-- Not distended, good bowel sounds,soft, non-tender.  Extremities-- trace pretibial edema bilaterally  Neurologic--  alert & oriented X3. Speech normal, gait appropriate for age, strength symmetric and appropriate for age.  Psych-- Cognition and judgment appear intact. Cooperative with normal attention span and concentration. No anxious or depressed appearing.        Assessment & Plan:

## 2014-07-18 NOTE — Assessment & Plan Note (Signed)
Controlled, cont meds, labs

## 2014-07-21 ENCOUNTER — Other Ambulatory Visit: Payer: Self-pay | Admitting: Internal Medicine

## 2014-07-21 MED ORDER — PRAVASTATIN SODIUM 10 MG PO TABS: 10.0000 mg | ORAL_TABLET | Freq: Every day | ORAL | Status: DC

## 2014-08-16 ENCOUNTER — Other Ambulatory Visit: Payer: Self-pay | Admitting: Internal Medicine

## 2014-08-16 ENCOUNTER — Telehealth: Payer: Self-pay

## 2014-08-16 MED ORDER — ALPRAZOLAM 0.25 MG PO TABS: ORAL_TABLET | ORAL | Status: DC

## 2014-08-16 NOTE — Telephone Encounter (Signed)
Refilled previously per PCP, Rx printed

## 2014-08-16 NOTE — Telephone Encounter (Signed)
Pt is requesting Alprazolam refill.  Last OV: 07/18/2014 Last Fill: 03/27/2014 # 90 with 3 RF UDS: None  Please advise.

## 2014-08-16 NOTE — Telephone Encounter (Signed)
Faxed to [facility]

## 2014-08-31 ENCOUNTER — Encounter: Payer: Self-pay | Admitting: Gastroenterology

## 2014-09-08 ENCOUNTER — Other Ambulatory Visit: Payer: Self-pay

## 2014-09-20 ENCOUNTER — Encounter: Payer: Self-pay | Admitting: Internal Medicine

## 2014-09-20 ENCOUNTER — Ambulatory Visit (INDEPENDENT_AMBULATORY_CARE_PROVIDER_SITE_OTHER): Payer: Medicare Other | Admitting: Internal Medicine

## 2014-09-20 VITALS — BP 118/62 | HR 52 | Temp 98.1°F | Wt 220.5 lb

## 2014-09-20 DIAGNOSIS — J0110 Acute frontal sinusitis, unspecified: Secondary | ICD-10-CM

## 2014-09-20 DIAGNOSIS — E785 Hyperlipidemia, unspecified: Secondary | ICD-10-CM

## 2014-09-20 MED ORDER — AMOXICILLIN 500 MG PO CAPS: 1000.0000 mg | ORAL_CAPSULE | Freq: Two times a day (BID) | ORAL | Status: DC

## 2014-09-20 NOTE — Patient Instructions (Signed)
Front desk:  [place] appointment for November schedule a lab visit only for next week, orders in Needs a ROV for January or February 2016   -----  Rest, fluids , tylenol If  cough, take robitussin  as needed  If nasal  congestion use OTC Nasocort: 2 nasal sprays on each side of the nose daily until you feel better Take the antibiotic as prescribed  (Amoxicillin) Call if not gradually better over the next  10 days Call anytime if the symptoms are severe

## 2014-09-20 NOTE — Assessment & Plan Note (Signed)
Based on last cholesterol panel started statins, check labs, see instructions

## 2014-09-20 NOTE — Progress Notes (Signed)
Pre visit review using our clinic review tool, if applicable. No additional management support is needed unless otherwise documented below in the visit note. 

## 2014-09-20 NOTE — Progress Notes (Signed)
Subjective:    Patient ID: Robert Krause, male    DOB: 1934/06/23, 80 y.o.   MRN: [id number]  DOS:  09/20/2014 Type of visit - description : acute Interval history: Not feeling well for the last 3 days, symptoms started with sore throat, cough, facial congestion. Yesterday his temperature was 101.0 x 1 time. Wife is not sick; has no taken any OTCs b/c he is afraid may affect his prostate. Also, on Pravachol, needs labs  ROS + Postnasal dripping, abundant, color? No chest pain or difficulty breathing Mild nausea, no vomiting or diarrhea Mild bronchial congestion with minimal sputum production with cough.  Past Medical History  Diagnosis Date  . Hyperlipidemia   . Hypertension   . Chronic sinusitis   . Chronic interstitial cystitis     Dr Amalia Hailey  . Allergic rhinitis   . Osteoarthritis   . Anxiety   . Glaucoma   . BPH (benign prostatic hyperplasia)     Past Surgical History  Procedure Laterality Date  . Transurethral resection of prostate    . Colonoscopy    . Polypectomy    . Appendectomy      History   Social History  . Marital Status: Married    Spouse Name: N/A    Number of Children: 1  . Years of Education: N/A   Occupational History  . retired- [employer]    Social History Main Topics  . Smoking status: Never Smoker   . Smokeless tobacco: Never Used  . Alcohol Use: 1.8 oz/week    3 Glasses of wine per week  . Drug Use: No  . Sexual Activity: Not on file   Other Topics Concern  . Not on file   Social History Narrative   Born in [place], raised in [place], moved to [place] in the 60s        Medication List       This list is accurate as of: 09/20/14  6:56 PM.  Always use your most recent med list.               ALPRAZolam 0.25 MG tablet  Commonly known as:  XANAX  TAKE 1 TABLET BY MOUTH EVERY 8 HOURS AS NEEDED     amoxicillin 500 MG capsule  Commonly known as:  AMOXIL  Take 2 capsules (1,000 mg total) by mouth 2 (two) times daily.     irbesartan-hydrochlorothiazide 150-12.5 MG per tablet  Commonly known as:  AVALIDE  Take 1 tablet by mouth daily.     pravastatin 10 MG tablet  Commonly known as:  PRAVACHOL  Take 1 tablet (10 mg total) by mouth daily.     tamsulosin 0.4 MG Caps capsule  Commonly known as:  FLOMAX  Take 1 capsule by mouth Daily.     timolol 0.5 % ophthalmic solution  Commonly known as:  TIMOPTIC  Place 1 drop into both eyes daily.           Objective:   Physical Exam BP 118/62  Pulse 52  Temp(Src) 98.1 F (36.7 C) (Oral)  Wt 220 lb 8 oz (100.018 kg)  SpO2 98% General -- alert, well-developed, NAD.   HEENT-- Not pale.  R Ear-- wax L ear-- normal Throat symmetric, no redness or discharge. Face symmetric, sinuses slt tender to palpation maxilary , bilaterally. Nose slt congested.  Lungs -- normal respiratory effort, no intercostal retractions, no accessory muscle use, and normal breath sounds.  Heart-- normal rate, regular rhythm, no murmur.  Extremities-- no pretibial edema bilaterally  Neurologic--  alert & oriented X3. Speech normal, gait appropriate for age, strength symmetric and appropriate for age.  Psych-- Cognition and judgment appear intact. Cooperative with normal attention span and concentration. No anxious or depressed appearing.     Assessment & Plan:  Sinusitis, Presents with facial congestion, postnasal dripping and one episode of fever. Likely sinusitis, see instructions

## 2014-09-25 ENCOUNTER — Other Ambulatory Visit (INDEPENDENT_AMBULATORY_CARE_PROVIDER_SITE_OTHER): Payer: Medicare Other

## 2014-09-25 DIAGNOSIS — E785 Hyperlipidemia, unspecified: Secondary | ICD-10-CM

## 2014-09-26 LAB — LIPID PANEL
CHOL/HDL RATIO: 4
Cholesterol: 167 mg/dL (ref 0–200)
HDL: 43.9 mg/dL (ref >39.00)
LDL Cholesterol: 107 mg/dL — ABNORMAL HIGH (ref 0–99)
NONHDL: 123.1
Triglycerides: 83 mg/dL (ref 0.0–149.0)
VLDL: 16.6 mg/dL (ref 0.0–40.0)

## 2014-09-26 LAB — AST: AST: 22 U/L (ref 0–37)

## 2014-09-26 LAB — ALT: ALT: 20 U/L (ref 0–53)

## 2014-09-29 MED ORDER — PRAVASTATIN SODIUM 10 MG PO TABS: 10.0000 mg | ORAL_TABLET | Freq: Every day | ORAL | Status: DC

## 2014-10-23 ENCOUNTER — Ambulatory Visit: Payer: Medicare Other | Admitting: Internal Medicine

## 2014-12-19 ENCOUNTER — Other Ambulatory Visit: Payer: Self-pay | Admitting: *Deleted

## 2014-12-19 DIAGNOSIS — H409 Unspecified glaucoma: Secondary | ICD-10-CM

## 2014-12-19 MED ORDER — TIMOLOL MALEATE 0.5 % OP SOLN: 1.0000 [drp] | Freq: Every day | OPHTHALMIC | Status: DC

## 2014-12-20 ENCOUNTER — Telehealth: Payer: Self-pay | Admitting: *Deleted

## 2014-12-20 ENCOUNTER — Other Ambulatory Visit: Payer: Self-pay

## 2014-12-20 DIAGNOSIS — R05 Cough: Secondary | ICD-10-CM

## 2014-12-20 DIAGNOSIS — R053 Chronic cough: Secondary | ICD-10-CM

## 2014-12-20 DIAGNOSIS — H409 Unspecified glaucoma: Secondary | ICD-10-CM

## 2014-12-20 MED ORDER — TIMOLOL MALEATE 0.5 % OP SOLN: 1.0000 [drp] | Freq: Two times a day (BID) | OPHTHALMIC | Status: DC

## 2014-12-20 MED ORDER — IRBESARTAN-HYDROCHLOROTHIAZIDE 150-12.5 MG PO TABS
1.0000 | ORAL_TABLET | Freq: Every day | ORAL | Status: DC
Start: 2014-12-20 — End: 2016-01-08

## 2014-12-20 NOTE — Telephone Encounter (Signed)
Caller name: [name] Relation to pt: self Call back number: [phone] Pharmacy: [facility] [address] for call:  Pt requesting refill on timolol (TIMOPTIC) 0.5 % ophthalmic solution,   states Dr Arnoldo Morale had changed his instructions from 1 drop into both eyes daily to using 1 drop in both eyes in the morning and 1 drop in both eyes before bed.  He has been doing the drops in the morning and at night for at least 2 years.  Please advise patient of instructions he should follow.  Pt also request a refill on irbesartan-hydrochlorothiazide (AVALIDE) 150-12.5 MG per tablet.

## 2014-12-20 NOTE — Telephone Encounter (Signed)
See below. Last Rx that Dr Arnoldo Morale sent last year was for once daily.  Please advise recommendations.

## 2014-12-28 DIAGNOSIS — N3010 Interstitial cystitis (chronic) without hematuria: Secondary | ICD-10-CM | POA: Diagnosis not present

## 2015-01-03 ENCOUNTER — Encounter: Payer: Self-pay | Admitting: Internal Medicine

## 2015-01-03 ENCOUNTER — Ambulatory Visit (INDEPENDENT_AMBULATORY_CARE_PROVIDER_SITE_OTHER): Payer: Medicare Other | Admitting: Internal Medicine

## 2015-01-03 VITALS — BP 151/89 | HR 58 | Temp 97.9°F | Ht 69.0 in | Wt 220.4 lb

## 2015-01-03 DIAGNOSIS — N3010 Interstitial cystitis (chronic) without hematuria: Secondary | ICD-10-CM

## 2015-01-03 DIAGNOSIS — I10 Essential (primary) hypertension: Secondary | ICD-10-CM | POA: Diagnosis not present

## 2015-01-03 DIAGNOSIS — M720 Palmar fascial fibromatosis [Dupuytren]: Secondary | ICD-10-CM | POA: Diagnosis not present

## 2015-01-03 DIAGNOSIS — Z79899 Other long term (current) drug therapy: Secondary | ICD-10-CM | POA: Diagnosis not present

## 2015-01-03 MED ORDER — ALPRAZOLAM 0.25 MG PO TABS: ORAL_TABLET | ORAL | Status: DC

## 2015-01-03 NOTE — Patient Instructions (Signed)
Please go to the lab and  provided urine sample for a UDS  Check the  blood pressure 2 or 3 times a month   Be sure your blood pressure is between 110/65 and  145/85.  if it is consistently higher or lower, let me know     Please come back to the office by 06-2015 for a physical exam. Come back fasting

## 2015-01-03 NOTE — Assessment & Plan Note (Signed)
Continue Avalide, BP today slightly elevated but normal at home

## 2015-01-03 NOTE — Assessment & Plan Note (Signed)
Patient request a refill on Xanax which help with cystitis, refill provided. Will check a UDS noting the patient had Pyridium last night

## 2015-01-03 NOTE — Assessment & Plan Note (Signed)
In addition to the contracture of the palmar area, he has bilateral hand paresthesias, CTS? Plan: Refer to Ortho/hand

## 2015-01-03 NOTE — Progress Notes (Signed)
Pre visit review using our clinic review tool, if applicable. No additional management support is needed unless otherwise documented below in the visit note. 

## 2015-01-03 NOTE — Progress Notes (Signed)
Subjective:    Patient ID: Robert Krause, male    DOB: 03-17-1934, 81 y.o.   MRN: [id number]  DOS:  01/03/2015 Type of visit - description : Follow-up Interval history: Complaining of both hand feeling "cold-freezing"  at night in a glove distribution, he has to stand up and shake the hands in order for them to get better. 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 Hypertension, high cholesterol: Good medication compliance, BP today slightly elevated, completely normal ambulatory BPs.   Review of Systems  Denies chest pain, difficulty breathing or lower extremity edema No cough or sputum production No neck pain  Past Medical History  Diagnosis Date  . Hyperlipidemia   . Hypertension   . Chronic sinusitis   . Chronic interstitial cystitis     Dr Amalia Hailey  . Allergic rhinitis   . Osteoarthritis   . Anxiety   . Glaucoma   . BPH (benign prostatic hyperplasia)     Past Surgical History  Procedure Laterality Date  . Transurethral resection of prostate    . Colonoscopy    . Polypectomy    . Appendectomy      History   Social History  . Marital Status: Married    Spouse Name: N/A  . Number of Children: 1  . Years of Education: N/A   Occupational History  . retired- [employer]    Social History Main Topics  . Smoking status: Never Smoker   . Smokeless tobacco: Never Used  . Alcohol Use: 1.8 oz/week    3 Glasses of wine per week  . Drug Use: No  . Sexual Activity: Not on file   Other Topics Concern  . Not on file   Social History Narrative   Born in [place], raised in [place], moved to [place] in the 60s        Medication List       This list is accurate as of: 01/03/15 11:59 PM.  Always use your most recent med list.               ALPRAZolam 0.25 MG tablet  Commonly known as:  XANAX  TAKE 1 TABLET BY MOUTH EVERY 8 HOURS AS NEEDED     irbesartan-hydrochlorothiazide 150-12.5 MG per tablet  Commonly known as:  AVALIDE  Take 1 tablet by mouth daily.     pravastatin 10 MG tablet  Commonly known as:  PRAVACHOL  Take 1 tablet (10 mg total) by mouth daily.     tamsulosin 0.4 MG Caps capsule  Commonly known as:  FLOMAX  Take 1 capsule by mouth Daily.     timolol 0.5 % ophthalmic solution  Commonly known as:  TIMOPTIC  Place 1 drop into both eyes 2 (two) times daily.           Objective:   Physical Exam  Constitutional: He is oriented to person, place, and time. He appears well-developed and well-nourished. No distress.  Musculoskeletal:  Hands and wrists: No synovitis on exam, he has some fibrotic changes in both palms.  Neurological: He is alert and oriented to person, place, and time. He has normal reflexes.  Motor exam symmetric, hyperflexion of the wrists did not cause any symptoms  Skin: He is not diaphoretic.  Psychiatric: He has a normal mood and affect. His behavior is normal. Judgment and thought content normal.  Assessment & Plan:   Problem List Items Addressed This Visit      Cardiovascular and Mediastinum   Essential hypertension - Primary    Continue Avalide, BP today slightly elevated but normal at home        Musculoskeletal and Integument   CONTRACTURE OF PALMAR FASCIA    In addition to the contracture of the palmar area, he has bilateral hand paresthesias, CTS? Plan: Refer to Ortho/hand        Genitourinary   CYSTITIS, CHRONIC INTERSTITIAL    Patient request a refill on Xanax which help with cystitis, refill provided. Will check a UDS noting the patient had Pyridium last night

## 2015-01-17 DIAGNOSIS — M79641 Pain in right hand: Secondary | ICD-10-CM | POA: Diagnosis not present

## 2015-01-17 DIAGNOSIS — G5602 Carpal tunnel syndrome, left upper limb: Secondary | ICD-10-CM | POA: Diagnosis not present

## 2015-01-17 DIAGNOSIS — G5601 Carpal tunnel syndrome, right upper limb: Secondary | ICD-10-CM | POA: Diagnosis not present

## 2015-01-17 DIAGNOSIS — M79642 Pain in left hand: Secondary | ICD-10-CM | POA: Diagnosis not present

## 2015-02-01 ENCOUNTER — Encounter: Payer: Self-pay | Admitting: Internal Medicine

## 2015-02-01 ENCOUNTER — Ambulatory Visit (INDEPENDENT_AMBULATORY_CARE_PROVIDER_SITE_OTHER): Payer: Medicare Other | Admitting: Internal Medicine

## 2015-02-01 VITALS — BP 122/64 | HR 96 | Temp 97.9°F | Wt 220.5 lb

## 2015-02-01 DIAGNOSIS — J029 Acute pharyngitis, unspecified: Secondary | ICD-10-CM

## 2015-02-01 DIAGNOSIS — J020 Streptococcal pharyngitis: Secondary | ICD-10-CM

## 2015-02-01 LAB — POCT RAPID STREP A (OFFICE): RAPID STREP A SCREEN: POSITIVE — AB

## 2015-02-01 MED ORDER — AZITHROMYCIN 250 MG PO TABS: ORAL_TABLET | ORAL | Status: DC

## 2015-02-01 NOTE — Progress Notes (Signed)
Subjective:    Patient ID: Robert Krause, male    DOB: Jul 07, 1934, 81 y.o.   MRN: [id number]  DOS:  02/01/2015 Type of visit - description : acute Interval history:  symptoms started last night with mild sore throat and mild generalized headache.  Review of Systems   admits to low-grade temperature last night, no chills. Very mild cough without sputum production. + mild sinus congestion, postnasal dripping, discharge is yellow in color  Past Medical History  Diagnosis Date  . Hyperlipidemia   . Hypertension   . Chronic sinusitis   . Chronic interstitial cystitis     Dr Amalia Hailey  . Allergic rhinitis   . Osteoarthritis   . Anxiety   . Glaucoma   . BPH (benign prostatic hyperplasia)     Past Surgical History  Procedure Laterality Date  . Transurethral resection of prostate    . Colonoscopy    . Polypectomy    . Appendectomy      History   Social History  . Marital Status: Married    Spouse Name: N/A  . Number of Children: 1  . Years of Education: N/A   Occupational History  . retired- [employer]    Social History Main Topics  . Smoking status: Never Smoker   . Smokeless tobacco: Never Used  . Alcohol Use: 1.8 oz/week    3 Glasses of wine per week  . Drug Use: No  . Sexual Activity: Not on file   Other Topics Concern  . Not on file   Social History Narrative   Born in [place], raised in [place], moved to [place] in the 60s        Medication List       This list is accurate as of: 02/01/15 11:23 AM.  Always use your most recent med list.               ALPRAZolam 0.25 MG tablet  Commonly known as:  XANAX  TAKE 1 TABLET BY MOUTH EVERY 8 HOURS AS NEEDED     azithromycin 250 MG tablet  Commonly known as:  ZITHROMAX Z-PAK  2 tabs a day the first day, then 1 tab a day x 4 days     irbesartan-hydrochlorothiazide 150-12.5 MG per tablet  Commonly known as:  AVALIDE  Take 1 tablet by mouth daily.     pravastatin 10 MG tablet  Commonly known as:  PRAVACHOL    Take 1 tablet (10 mg total) by mouth daily.     tamsulosin 0.4 MG Caps capsule  Commonly known as:  FLOMAX  Take 1 capsule by mouth Daily.     timolol 0.5 % ophthalmic solution  Commonly known as:  TIMOPTIC  Place 1 drop into both eyes 2 (two) times daily.           Objective:   Physical Exam BP 122/64 mmHg  Pulse 96  Temp(Src) 97.9 F (36.6 C) (Oral)  Wt 220 lb 8 oz (100.018 kg)  SpO2 97%  General:   Well developed, well nourished . NAD.  HEENT:  Normocephalic . Face symmetric, atraumatic;  Nose is slightly congested, sinuses no TTP. Throat looks actually normal, no red, no discharge, uvula midline Lungs:  CTA B Normal respiratory effort, no intercostal retractions, no accessory muscle use. Heart: RRR,  no murmur.  Muscle skeletal: no pretibial edema bilaterally  Skin: Not pale. Not jaundice Neurologic:  alert & oriented X3.  Speech normal, gait appropriate for age and unassisted Psych--  Cognition and judgment appear intact.  Cooperative with normal attention span and concentration.  Behavior appropriate. No anxious or depressed appearing.       Assessment & Plan:    Pharyngitis, Presents with sore throat and low-grade temperature, rapid a strep test was faintly positive. Plan:  antibiotics (report does not like amoxicillin "does not agree w/ me", will prescribe a Z-Pak) Throat culture, if negative will discontinue antibiotics

## 2015-02-01 NOTE — Progress Notes (Signed)
Pre visit review using our clinic review tool, if applicable. No additional management support is needed unless otherwise documented below in the visit note. 

## 2015-02-01 NOTE — Patient Instructions (Signed)
Take antibiotics as prescribed Rest, drink plenty of fluids and take Tylenol for pain or fever  Call if you are not gradually improving  Call anytime if you get worse.

## 2015-02-03 LAB — CULTURE, GROUP A STREP: Organism ID, Bacteria: NORMAL

## 2015-02-08 ENCOUNTER — Telehealth: Payer: Self-pay

## 2015-02-08 NOTE — Telephone Encounter (Signed)
UDS: 01/03/2015  Negative for Alprazolam Positive for Gabapentin  Low risk per Dr. Larose Kells 02/07/2015

## 2015-02-10 ENCOUNTER — Other Ambulatory Visit: Payer: Self-pay | Admitting: Family

## 2015-03-22 ENCOUNTER — Other Ambulatory Visit: Payer: Self-pay

## 2015-03-26 ENCOUNTER — Other Ambulatory Visit: Payer: Self-pay

## 2015-03-26 ENCOUNTER — Encounter: Payer: Self-pay | Admitting: Internal Medicine

## 2015-03-26 ENCOUNTER — Ambulatory Visit (INDEPENDENT_AMBULATORY_CARE_PROVIDER_SITE_OTHER): Payer: Medicare Other | Admitting: Internal Medicine

## 2015-03-26 VITALS — BP 118/78 | HR 57 | Temp 97.8°F | Ht 69.0 in | Wt 222.1 lb

## 2015-03-26 DIAGNOSIS — L989 Disorder of the skin and subcutaneous tissue, unspecified: Secondary | ICD-10-CM | POA: Diagnosis not present

## 2015-03-26 MED ORDER — KETOCONAZOLE 2 % EX CREA: 1.0000 "application " | TOPICAL_CREAM | Freq: Every day | CUTANEOUS | Status: DC

## 2015-03-26 NOTE — Progress Notes (Signed)
Pre visit review using our clinic review tool, if applicable. No additional management support is needed unless otherwise documented below in the visit note. 

## 2015-03-26 NOTE — Patient Instructions (Signed)
Call if not back to normal in 2-3 weeks

## 2015-03-26 NOTE — Progress Notes (Signed)
   Subjective:    Patient ID: Robert Krause, male    DOB: 01/28/1934, 81 y.o.   MRN: [id number]  DOS:  03/26/2015 Type of visit - description : acute, here w/ wife Interval history: Skin lesion at the right arm for 2 months, + itch (severe). Denies any discharge, has not use any creams   Review of Systems   Past Medical History  Diagnosis Date  . Hyperlipidemia   . Hypertension   . Chronic sinusitis   . Chronic interstitial cystitis     Dr Amalia Hailey  . Allergic rhinitis   . Osteoarthritis   . Anxiety   . Glaucoma   . BPH (benign prostatic hyperplasia)     Past Surgical History  Procedure Laterality Date  . Transurethral resection of prostate    . Colonoscopy    . Polypectomy    . Appendectomy      History   Social History  . Marital Status: Married    Spouse Name: N/A  . Number of Children: 1  . Years of Education: N/A   Occupational History  . retired- [employer]    Social History Main Topics  . Smoking status: Never Smoker   . Smokeless tobacco: Never Used  . Alcohol Use: 1.8 oz/week    3 Glasses of wine per week  . Drug Use: No  . Sexual Activity: Not on file   Other Topics Concern  . Not on file   Social History Narrative   Born in [place], raised in [place], moved to [place] in the 60s        Medication List       This list is accurate as of: 03/26/15  8:50 PM.  Always use your most recent med list.               ALPRAZolam 0.25 MG tablet  Commonly known as:  XANAX  TAKE 1 TABLET BY MOUTH EVERY 8 HOURS AS NEEDED     irbesartan-hydrochlorothiazide 150-12.5 MG per tablet  Commonly known as:  AVALIDE  Take 1 tablet by mouth daily.     ketoconazole 2 % cream  Commonly known as:  NIZORAL  Apply 1 application topically daily.     pravastatin 10 MG tablet  Commonly known as:  PRAVACHOL  Take 1 tablet (10 mg total) by mouth daily.     tamsulosin 0.4 MG Caps capsule  Commonly known as:  FLOMAX  Take 1 capsule by mouth Daily.     timolol 0.5 %  ophthalmic solution  Commonly known as:  TIMOPTIC  PLACE 1 DROP INTO BOTH EYES TWICE DAILY           Objective:   Physical Exam  Constitutional: He appears well-developed and well-nourished. No distress.  Skin: He is not diaphoretic.     Psychiatric: He has a normal mood and affect. His behavior is normal. Judgment and thought content normal.   BP 118/78 mmHg  Pulse 57  Temp(Src) 97.8 F (36.6 C) (Oral)  Ht 5\' 9"  (1.753 m)  Wt 222 lb 2 oz (100.755 kg)  BMI 32.79 kg/m2  SpO2 98%       Assessment & Plan:    Skin lesion, likely a fungal infex, treated with ketoconazole, if not back to normal he will let me know.

## 2015-06-14 DIAGNOSIS — H2513 Age-related nuclear cataract, bilateral: Secondary | ICD-10-CM | POA: Diagnosis not present

## 2015-06-14 DIAGNOSIS — H04123 Dry eye syndrome of bilateral lacrimal glands: Secondary | ICD-10-CM | POA: Diagnosis not present

## 2015-06-14 DIAGNOSIS — H25011 Cortical age-related cataract, right eye: Secondary | ICD-10-CM | POA: Diagnosis not present

## 2015-06-14 DIAGNOSIS — H2511 Age-related nuclear cataract, right eye: Secondary | ICD-10-CM | POA: Diagnosis not present

## 2015-06-14 DIAGNOSIS — H25013 Cortical age-related cataract, bilateral: Secondary | ICD-10-CM | POA: Diagnosis not present

## 2015-06-25 HISTORY — PX: CATARACT EXTRACTION: SUR2

## 2015-07-04 DIAGNOSIS — H25011 Cortical age-related cataract, right eye: Secondary | ICD-10-CM | POA: Diagnosis not present

## 2015-07-04 DIAGNOSIS — H2511 Age-related nuclear cataract, right eye: Secondary | ICD-10-CM | POA: Diagnosis not present

## 2015-07-11 ENCOUNTER — Telehealth: Payer: Self-pay | Admitting: Internal Medicine

## 2015-07-11 NOTE — Telephone Encounter (Signed)
Pre visit letter mailed 07/03/15

## 2015-07-23 ENCOUNTER — Encounter: Payer: Self-pay | Admitting: *Deleted

## 2015-07-23 ENCOUNTER — Telehealth: Payer: Self-pay | Admitting: *Deleted

## 2015-07-23 NOTE — Telephone Encounter (Signed)
Pre-Visit Call completed with patient and chart updated.   Pre-Visit Info documented in Specialty Comments under [employer].    

## 2015-07-24 ENCOUNTER — Encounter: Payer: Self-pay | Admitting: Internal Medicine

## 2015-07-24 ENCOUNTER — Ambulatory Visit (INDEPENDENT_AMBULATORY_CARE_PROVIDER_SITE_OTHER): Payer: Medicare Other | Admitting: Internal Medicine

## 2015-07-24 VITALS — BP 124/76 | HR 49 | Temp 97.6°F | Ht 69.0 in | Wt 219.2 lb

## 2015-07-24 DIAGNOSIS — I10 Essential (primary) hypertension: Secondary | ICD-10-CM

## 2015-07-24 DIAGNOSIS — E785 Hyperlipidemia, unspecified: Secondary | ICD-10-CM | POA: Diagnosis not present

## 2015-07-24 DIAGNOSIS — N3010 Interstitial cystitis (chronic) without hematuria: Secondary | ICD-10-CM | POA: Diagnosis not present

## 2015-07-24 DIAGNOSIS — Z0000 Encounter for general adult medical examination without abnormal findings: Secondary | ICD-10-CM | POA: Diagnosis not present

## 2015-07-24 DIAGNOSIS — N411 Chronic prostatitis: Secondary | ICD-10-CM | POA: Diagnosis not present

## 2015-07-24 DIAGNOSIS — I781 Nevus, non-neoplastic: Secondary | ICD-10-CM

## 2015-07-24 DIAGNOSIS — Z23 Encounter for immunization: Secondary | ICD-10-CM

## 2015-07-24 LAB — LIPID PANEL
CHOL/HDL RATIO: 4
Cholesterol: 154 mg/dL (ref 0–200)
HDL: 39.5 mg/dL (ref >39.00)
LDL Cholesterol: 98 mg/dL (ref 0–99)
NonHDL: 114.54
Triglycerides: 85 mg/dL (ref 0.0–149.0)
VLDL: 17 mg/dL (ref 0.0–40.0)

## 2015-07-24 LAB — CBC WITH DIFFERENTIAL/PLATELET
BASOS ABS: 0 10*3/uL (ref 0.0–0.1)
Basophils Relative: 0.7 % (ref 0.0–3.0)
EOS ABS: 0.2 10*3/uL (ref 0.0–0.7)
Eosinophils Relative: 3.8 % (ref 0.0–5.0)
HCT: 45.9 % (ref 39.0–52.0)
Hemoglobin: 15.7 g/dL (ref 13.0–17.0)
LYMPHS ABS: 1.9 10*3/uL (ref 0.7–4.0)
Lymphocytes Relative: 29.8 % (ref 12.0–46.0)
MCHC: 34.2 g/dL (ref 30.0–36.0)
MCV: 93.3 fl (ref 78.0–100.0)
Monocytes Absolute: 0.5 10*3/uL (ref 0.1–1.0)
Monocytes Relative: 7.6 % (ref 3.0–12.0)
NEUTROS ABS: 3.7 10*3/uL (ref 1.4–7.7)
NEUTROS PCT: 58.1 % (ref 43.0–77.0)
Platelets: 181 10*3/uL (ref 150.0–400.0)
RBC: 4.92 Mil/uL (ref 4.22–5.81)
RDW: 13.3 % (ref 11.5–15.5)
WBC: 6.3 10*3/uL (ref 4.0–10.5)

## 2015-07-24 LAB — BASIC METABOLIC PANEL
BUN: 18 mg/dL (ref 6–23)
CO2: 30 meq/L (ref 19–32)
Calcium: 9 mg/dL (ref 8.4–10.5)
Chloride: 102 mEq/L (ref 96–112)
Creatinine, Ser: 1.04 mg/dL (ref 0.40–1.50)
GFR: 73.27 mL/min (ref >60.00)
Glucose, Bld: 114 mg/dL — ABNORMAL HIGH (ref 70–99)
Potassium: 3.9 mEq/L (ref 3.5–5.1)
Sodium: 137 mEq/L (ref 135–145)

## 2015-07-24 LAB — AST: AST: 16 U/L (ref 0–37)

## 2015-07-24 LAB — TSH: TSH: 1.75 u[IU]/mL (ref 0.35–4.50)

## 2015-07-24 LAB — ALT: ALT: 16 U/L (ref 0–53)

## 2015-07-24 MED ORDER — ALPRAZOLAM 0.25 MG PO TABS: ORAL_TABLET | ORAL | Status: DC

## 2015-07-24 NOTE — Assessment & Plan Note (Signed)
Check FLP, AST, ALT. Continue with Pravachol

## 2015-07-24 NOTE — Assessment & Plan Note (Addendum)
BP well-controlled, he is bradycardic but asymptomatic today, on chart review he has been bradycardic sometimes. Is  taking timolol eye drops. EKG today: Sinus bradycardia. Check a BMP, CBC and TSH Check a Holter due to bradycardia

## 2015-07-24 NOTE — Progress Notes (Signed)
Pre visit review using our clinic review tool, if applicable. No additional management support is needed unless otherwise documented below in the visit note. 

## 2015-07-24 NOTE — Patient Instructions (Addendum)
Get your blood work before you leave     Check the  blood pressure and pulse 2 or 3 times a    Week  Be sure your blood pressure is between 110/65 and  145/85.  if it is consistently higher or lower, let me know   Next visit  for a routine visit in 3 months   (30 minutes) Please schedule an appointment at the front desk No need to come back fasting     Fall Prevention and [place] cause injuries and can affect all age groups. It is possible to use preventive measures to significantly decrease the likelihood of falls. There are many simple measures which can make your home safer and prevent falls. OUTDOORS  Repair cracks and edges of walkways and driveways.  Remove high doorway thresholds.  Trim shrubbery on the main path into your home.  Have good outside lighting.  Clear walkways of tools, rocks, debris, and clutter.  Check that handrails are not broken and are securely fastened. Both sides of steps should have handrails.  Have leaves, snow, and ice cleared regularly.  Use sand or salt on walkways during winter months.  In the garage, clean up grease or oil spills. BATHROOM  Install night lights.  Install grab bars by the toilet and in the tub and shower.  Use non-skid mats or decals in the tub or shower.  Place a plastic non-slip stool in the shower to sit on, if needed.  Keep floors dry and clean up all water on the floor immediately.  Remove soap buildup in the tub or shower on a regular basis.  Secure bath mats with non-slip, double-sided rug tape.  Remove throw rugs and tripping hazards from the floors. BEDROOMS  Install night lights.  Make sure a bedside light is easy to reach.  Do not use oversized bedding.  Keep a telephone by your bedside.  Have a firm chair with side arms to use for getting dressed.  Remove throw rugs and tripping hazards from the floor. KITCHEN  Keep handles on pots and pans turned toward the center of the stove.  Use back burners when possible.  Clean up spills quickly and allow time for drying.  Avoid walking on wet floors.  Avoid hot utensils and knives.  Position shelves so they are not too high or low.  Place commonly used objects within easy reach.  If necessary, use a sturdy step stool with a grab bar when reaching.  Keep electrical cables out of the way.  Do not use floor polish or wax that makes floors slippery. If you must use wax, use non-skid floor wax.  Remove throw rugs and tripping hazards from the floor. STAIRWAYS  Never leave objects on stairs.  Place handrails on both sides of stairways and use them. Fix any loose handrails. Make sure handrails on both sides of the stairways are as long as the stairs.  Check carpeting to make sure it is firmly attached along stairs. Make repairs to worn or loose carpet promptly.  Avoid placing throw rugs at the top or bottom of stairways, or properly secure the rug with carpet tape to prevent slippage. Get rid of throw rugs, if possible.  Have an electrician put in a light switch at the top and bottom of the stairs. OTHER FALL PREVENTION TIPS  Wear low-heel or rubber-soled shoes that are supportive and fit well. Wear closed toe shoes.  When using a stepladder, make sure it is fully opened  and both spreaders are firmly locked. Do not climb a closed stepladder.  Add color or contrast paint or tape to grab bars and handrails in your home. Place contrasting color strips on first and last steps.  Learn and use mobility aids as needed. Install an electrical emergency response system.  Turn on lights to avoid dark areas. Replace light bulbs that burn out immediately. Get light switches that glow.  Arrange furniture to create clear pathways. Keep furniture in the same place.  Firmly attach carpet with non-skid or double-sided tape.  Eliminate uneven floor surfaces.  Select a carpet pattern that does not visually hide the edge of  steps.  Be aware of all pets. OTHER HOME SAFETY TIPS  Set the water temperature for 120 F (48.8 C).  Keep emergency numbers on or near the telephone.  Keep smoke detectors on every level of the home and near sleeping areas. Document Released: 10/31/2002 Document Revised: 05/11/2012 Document Reviewed: 01/30/2012 [facility] Patient Information 2015 [place], [place]. This information is not intended to replace advice given to you by your health care provider. Make sure you discuss any questions you have with your health care provider.   Preventive Care for Adults Ages 66 and over  Blood pressure check.** / Every 1 to 2 years.  Lipid and cholesterol check.**/ Every 5 years beginning at age 21.  Lung cancer screening. / Every year if you are aged 63-80 years and have a 30-pack-year history of smoking and currently smoke or have quit within the past 15 years. Yearly screening is stopped once you have quit smoking for at least 15 years or develop a health problem that would prevent you from having lung cancer treatment.  Fecal occult blood test (FOBT) of stool. / Every year beginning at age 23 and continuing until age 34. You may not have to do this test if you get a colonoscopy every 10 years.  Flexible sigmoidoscopy** or colonoscopy.** / Every 5 years for a flexible sigmoidoscopy or every 10 years for a colonoscopy beginning at age 30 and continuing until age 22.  Hepatitis C blood test.** / For all people born from [birth year] through 1963 and any individual with known risks for hepatitis C.  Abdominal aortic aneurysm (AAA) screening.** / A one-time screening for ages 34 to 76 years who are current or former smokers.  Skin self-exam. / Monthly.  Influenza vaccine. / Every year.  Tetanus, diphtheria, and acellular pertussis (Tdap/Td) vaccine.** / 1 dose of Td every 10 years.  Varicella vaccine.** / Consult your health care provider.  Zoster vaccine.** / 1 dose for adults aged 34 years or  older.  Pneumococcal 13-valent conjugate (PCV13) vaccine.** / Consult your health care provider.  Pneumococcal polysaccharide (PPSV23) vaccine.** / 1 dose for all adults aged 35 years and older.  Meningococcal vaccine.** / Consult your health care provider.  Hepatitis A vaccine.** / Consult your health care provider.  Hepatitis B vaccine.** / Consult your health care provider.  Haemophilus influenzae type b (Hib) vaccine.** / Consult your health care provider. **Family history and personal history of risk and conditions may change your health care provider's recommendations. Document Released: 01/06/2002 Document Revised: 11/15/2013 Document Reviewed: 04/07/2011 [facility] Patient Information 2015 [place], [place]. This information is not intended to replace advice given to you by your health care provider. Make sure you discuss any questions you have with your health care provider.

## 2015-07-24 NOTE — Progress Notes (Signed)
Subjective:    Patient ID: Robert Krause, male    DOB: 12-28-1933, 81 y.o.   MRN: [id number]  DOS:  07/24/2015 Type of visit - description :  Here for Medicare AWV:  1. Risk factors based on Past M, S, F history: reviewed 2. Physical Activities:  Walks almost qd 3. Depression/mood: neg screening  4. Hearing:  No problems noted or reported  5. ADL's: independent, drives  6. Fall Risk: no recent falls, prevention discussed , see AVS 7. home Safety: does feel safe at home  8. Height, weight, & visual acuity: see VS, s/p R cataract surgery 06-2015 9. Counseling: provided 10. Labs ordered based on risk factors: if needed  11. Referral Coordination: if needed 12. Care Plan, see assessment and plan , written personalized plan provided , see AVS 13. Cognitive Assessment: motor skills and cognition appropriate for age 55. Care team updated 15. End-of-life care , has a HC-POA  In addition, today we discussed the following: Hypertension: Good compliance with medication, ambulatory BPs okay Interstitial cystitis tics, on Xanax as needed, sees urology regularly New nevus at the chest for one year. Noted to be bradycardic. Essentially asymptomatic, occasional dizziness. Reports insomnia for years, not sure if he likes treatment.    Review of Systems Constitutional: No fever. No chills. No unexplained wt changes. No unusual sweats  HEENT: No dental problems, no ear discharge, no facial swelling, no voice changes. No eye discharge, no eye  redness , no  intolerance to light   Respiratory: No wheezing , no  difficulty breathing. No cough , no mucus production  Cardiovascular: No CP, no leg swelling , no  Palpitations  GI: no nausea, no vomiting, no diarrhea , no  abdominal pain.  No blood in the stools. No dysphagia, no odynophagia    Endocrine: No polyphagia, no polyuria , no polydipsia  GU: No dysuria, gross hematuria, difficulty urinating. No urinary urgency, no  frequency.  Musculoskeletal: No joint swellings or unusual aches or pains  Skin: No change in the color of the skin, palor , no  Rash  Allergic, immunologic: No environmental allergies , no  food allergies  Neurological:  Occasionally feels is slightly dizzy when he stands up, no  syncope. No headaches. No diplopia, no slurred, no slurred speech, no motor deficits, no facial  Numbness  Hematological: No enlarged lymph nodes, no easy bruising , no unusual bleedings  Psychiatry: No suicidal ideas, no hallucinations, no beavior problems, no confusion.  No unusual/severe anxiety, no depression   Past Medical History  Diagnosis Date  . Hyperlipidemia   . Hypertension   . Chronic sinusitis   . Chronic interstitial cystitis     Dr Amalia Hailey  . Allergic rhinitis   . Osteoarthritis   . Anxiety   . Glaucoma   . BPH (benign prostatic hyperplasia)     Past Surgical History  Procedure Laterality Date  . Transurethral resection of prostate    . Colonoscopy    . Polypectomy    . Appendectomy    . Cataract extraction Right 06-2015    Social History   Social History  . Marital Status: Married    Spouse Name: N/A  . Number of Children: 1  . Years of Education: N/A   Occupational History  . retired- [employer]    Social History Main Topics  . Smoking status: Never Smoker   . Smokeless tobacco: Never Used  . Alcohol Use: 1.8 oz/week    3 Glasses of wine  per week  . Drug Use: No  . Sexual Activity: Not on file   Other Topics Concern  . Not on file   Social History Narrative   Born in [place], raised in [place], moved to [place] in the 60s     Family History  Problem Relation Age of Onset  . Anxiety disorder Other   . Hyperlipidemia Father   . Hypertension Father   . CAD Father   . Colon cancer Neg Hx   . Prostate cancer Neg Hx        Medication List       This list is accurate as of: 07/24/15  8:42 PM.  Always use your most recent med list.               ALPRAZolam 0.25 MG  tablet  Commonly known as:  XANAX  TAKE 1 TABLET BY MOUTH EVERY 8 HOURS AS NEEDED     irbesartan-hydrochlorothiazide 150-12.5 MG per tablet  Commonly known as:  AVALIDE  Take 1 tablet by mouth daily.     ketoconazole 2 % cream  Commonly known as:  NIZORAL  Apply 1 application topically daily.     pravastatin 10 MG tablet  Commonly known as:  PRAVACHOL  Take 1 tablet (10 mg total) by mouth daily.     tamsulosin 0.4 MG Caps capsule  Commonly known as:  FLOMAX  Take 1 capsule by mouth Daily.     timolol 0.5 % ophthalmic solution  Commonly known as:  TIMOPTIC  PLACE 1 DROP INTO BOTH EYES TWICE DAILY           Objective:   Physical Exam  Skin:      BP 124/76 mmHg  Pulse 49  Temp(Src) 97.6 F (36.4 C) (Oral)  Ht 5\' 9"  (1.753 m)  Wt 219 lb 4 oz (99.451 kg)  BMI 32.36 kg/m2  SpO2 97% General:   Well developed, well nourished . NAD.  Neck:  No  Thyromegaly  HEENT:  Normocephalic . Face symmetric, atraumatic Lungs:  CTA B Normal respiratory effort, no intercostal retractions, no accessory muscle use. Heart: brady, no murmur.  No pretibial edema bilaterally  Abdomen:  Not distended, soft, non-tender. No rebound or rigidity. No bruit  Skin: Exposed areas without rash. Not pale. Not jaundice Neurologic:  alert & oriented X3.  Speech normal, gait appropriate for age and unassisted Strength symmetric and appropriate for age.  Psych: Cognition and judgment appear intact.  Cooperative with normal attention span and concentration.  Behavior appropriate. No anxious or depressed appearing.    Assessment & Plan:     Blue nevus ?: Refer to dermatology  Insomnia: Recommend melatonin, not interested on rx medications.

## 2015-07-24 NOTE — Assessment & Plan Note (Addendum)
Td 2007; pnm 23-- 2004, due for a booster, declined, thinks he had one at some point; prevanr today 2015; zostavax-- 2014 per pt @ his pharmacy  Last cscope 2012, several polyps, next  2017  Prostate ca screening-- per urology Discussed diet-exercise

## 2015-07-27 ENCOUNTER — Other Ambulatory Visit: Payer: Self-pay | Admitting: Internal Medicine

## 2015-07-27 ENCOUNTER — Encounter (INDEPENDENT_AMBULATORY_CARE_PROVIDER_SITE_OTHER): Payer: Medicare Other

## 2015-07-27 DIAGNOSIS — R001 Bradycardia, unspecified: Secondary | ICD-10-CM | POA: Diagnosis not present

## 2015-07-27 DIAGNOSIS — I10 Essential (primary) hypertension: Secondary | ICD-10-CM

## 2015-09-21 DIAGNOSIS — D485 Neoplasm of uncertain behavior of skin: Secondary | ICD-10-CM | POA: Diagnosis not present

## 2015-09-21 DIAGNOSIS — D1801 Hemangioma of skin and subcutaneous tissue: Secondary | ICD-10-CM | POA: Diagnosis not present

## 2015-09-21 DIAGNOSIS — L814 Other melanin hyperpigmentation: Secondary | ICD-10-CM | POA: Diagnosis not present

## 2015-09-21 DIAGNOSIS — L821 Other seborrheic keratosis: Secondary | ICD-10-CM | POA: Diagnosis not present

## 2015-09-21 DIAGNOSIS — D2271 Melanocytic nevi of right lower limb, including hip: Secondary | ICD-10-CM | POA: Diagnosis not present

## 2015-09-21 DIAGNOSIS — D3617 Benign neoplasm of peripheral nerves and autonomic nervous system of trunk, unspecified: Secondary | ICD-10-CM | POA: Diagnosis not present

## 2015-10-08 ENCOUNTER — Other Ambulatory Visit: Payer: Self-pay

## 2015-10-08 MED ORDER — PRAVASTATIN SODIUM 10 MG PO TABS: 10.0000 mg | ORAL_TABLET | Freq: Every day | ORAL | Status: DC

## 2015-10-09 ENCOUNTER — Other Ambulatory Visit: Payer: Self-pay

## 2015-10-09 MED ORDER — PRAVASTATIN SODIUM 10 MG PO TABS: 10.0000 mg | ORAL_TABLET | Freq: Every day | ORAL | Status: DC

## 2015-11-07 ENCOUNTER — Encounter: Payer: Self-pay | Admitting: Internal Medicine

## 2015-11-07 ENCOUNTER — Ambulatory Visit (INDEPENDENT_AMBULATORY_CARE_PROVIDER_SITE_OTHER): Payer: Medicare Other | Admitting: Internal Medicine

## 2015-11-07 VITALS — BP 132/68 | HR 50 | Temp 97.6°F | Ht 69.0 in | Wt 221.4 lb

## 2015-11-07 DIAGNOSIS — E669 Obesity, unspecified: Secondary | ICD-10-CM | POA: Diagnosis not present

## 2015-11-07 DIAGNOSIS — R001 Bradycardia, unspecified: Secondary | ICD-10-CM

## 2015-11-07 DIAGNOSIS — R739 Hyperglycemia, unspecified: Secondary | ICD-10-CM | POA: Diagnosis not present

## 2015-11-07 DIAGNOSIS — Z79899 Other long term (current) drug therapy: Secondary | ICD-10-CM | POA: Diagnosis not present

## 2015-11-07 DIAGNOSIS — Z09 Encounter for follow-up examination after completed treatment for conditions other than malignant neoplasm: Secondary | ICD-10-CM

## 2015-11-07 DIAGNOSIS — F411 Generalized anxiety disorder: Secondary | ICD-10-CM | POA: Diagnosis not present

## 2015-11-07 LAB — HEMOGLOBIN A1C: HEMOGLOBIN A1C: 5.7 % (ref 4.6–6.5)

## 2015-11-07 MED ORDER — ALPRAZOLAM 0.25 MG PO TABS: 0.2500 mg | ORAL_TABLET | Freq: Three times a day (TID) | ORAL | Status: DC | PRN

## 2015-11-07 NOTE — Progress Notes (Signed)
Subjective:    Patient ID: Robert Krause, male    DOB: 05-06-1934, 81 y.o.   MRN: [id number]  DOS:  11/07/2015 Type of visit - description : Routine office visit Interval history: Tachycardia: Chart reviewed, Holter was negative. Hyperglycemia: Last blood sugar elevated, check A1c Anxiety: Well-controlled with Xanax. Needs a UDS and contract Was referred to dermatology, biopsy was negative. Good compliance with all medications  Review of Systems  Denies chest pain or difficulty breathing No nausea, vomiting, diarrhea Past Medical History  Diagnosis Date  . Hyperlipidemia   . Hypertension   . Chronic sinusitis   . Chronic interstitial cystitis     Dr Amalia Hailey  . Allergic rhinitis   . Osteoarthritis   . Anxiety   . Glaucoma   . BPH (benign prostatic hyperplasia)   . Venous [place]     right inferior clavicle, shave    Past Surgical History  Procedure Laterality Date  . Transurethral resection of prostate    . Colonoscopy    . Polypectomy    . Appendectomy    . Cataract extraction Right 06-2015    Social History   Social History  . Marital Status: Married    Spouse Name: N/A  . Number of Children: 1  . Years of Education: N/A   Occupational History  . retired- [employer]    Social History Main Topics  . Smoking status: Never Smoker   . Smokeless tobacco: Never Used  . Alcohol Use: 1.8 oz/week    3 Glasses of wine per week  . Drug Use: No  . Sexual Activity: Not on file   Other Topics Concern  . Not on file   Social History Narrative   Born in [place], raised in [place], moved to [place] in the 60s        Medication List       This list is accurate as of: 11/07/15 11:59 PM.  Always use your most recent med list.               ALPRAZolam 0.25 MG tablet  Commonly known as:  XANAX  Take 1 tablet (0.25 mg total) by mouth every 8 (eight) hours as needed for anxiety.     irbesartan-hydrochlorothiazide 150-12.5 MG tablet  Commonly known as:  AVALIDE  Take 1  tablet by mouth daily.     pravastatin 10 MG tablet  Commonly known as:  PRAVACHOL  Take 1 tablet (10 mg total) by mouth daily.     tamsulosin 0.4 MG Caps capsule  Commonly known as:  FLOMAX  Take 1 capsule by mouth Daily.     timolol 0.5 % ophthalmic solution  Commonly known as:  TIMOPTIC  PLACE 1 DROP INTO BOTH EYES TWICE DAILY           Objective:   Physical Exam BP 132/68 mmHg  Pulse 50  Temp(Src) 97.6 F (36.4 C) (Oral)  Ht 5\' 9"  (1.753 m)  Wt 221 lb 6 oz (100.415 kg)  BMI 32.68 kg/m2  SpO2 96% General:   Well developed, well nourished . NAD.  HEENT:  Normocephalic . Face symmetric, atraumatic Lungs:  CTA B Normal respiratory effort, no intercostal retractions, no accessory muscle use. Heart: Bradycardic,  no murmur.  No pretibial edema bilaterally  Skin: Not pale. Not jaundice Neurologic:  alert & oriented X3.  Speech normal, gait appropriate for age and unassisted Psych--  Cognition and judgment appear intact.  Cooperative with normal attention span and concentration.  Behavior  appropriate. No anxious or depressed appearing.      Assessment & Plan:   Assessment HTN Bradycardia: Holter 2016 essentially negative Hyperlipidemia Anxiety: on xanax Insomnia: On melatonin, not interested in other medications DJD Obesity: BMI 32 Bradycardia:  Holter 07/2015  ok Glaucoma GU: Dr. Amalia Hailey --BPH, TURP --Interstitial cystitis Venous lake, right inferior clavicle  PLAN Anxiety: Continue with Xanax, UDS and contract today Obesity: He is actually very active, counseled  about diet Bradycardia: Holter essentially negative. Asymptomatic Was referred to dermatology for chest skin lesion, biopsy benign. Mild hyperglycemia: Check A1c RTC 06-2016, CPX.

## 2015-11-07 NOTE — Patient Instructions (Signed)
Get your blood work before you leave   We also need a urine sample  Check the  blood pressure 2 or 3 times a month  Be sure your blood pressure is between 110/65 and  145/85.  if it is consistently higher or lower, let me know     Next visit  for a complete physical exam by August 2017, fasting      Please schedule an appointment at the front desk

## 2015-11-07 NOTE — Progress Notes (Signed)
Pre visit review using our clinic review tool, if applicable. No additional management support is needed unless otherwise documented below in the visit note. 

## 2015-11-08 DIAGNOSIS — Z09 Encounter for follow-up examination after completed treatment for conditions other than malignant neoplasm: Secondary | ICD-10-CM | POA: Insufficient documentation

## 2015-11-08 HISTORY — DX: Encounter for follow-up examination after completed treatment for conditions other than malignant neoplasm: Z09

## 2015-11-08 NOTE — Assessment & Plan Note (Signed)
Anxiety: Continue with Xanax, UDS and contract today Obesity: He is actually very active, counseled  about diet Bradycardia: Holter essentially negative. Asymptomatic Was referred to dermatology for chest skin lesion, biopsy benign. Mild hyperglycemia: Check A1c RTC 06-2016, CPX.

## 2015-11-21 ENCOUNTER — Other Ambulatory Visit: Payer: Self-pay | Admitting: Internal Medicine

## 2015-11-21 NOTE — Telephone Encounter (Signed)
Pt is requesting refill on Timolol 0.5 % ophthalmic solution. Pt sig: 1 drop in both eyes bid. Last filled on 02/13/2015 #10 mL and 5RF. Okay to refill?

## 2015-11-22 NOTE — Telephone Encounter (Signed)
No, needs to call his eye doctor

## 2015-11-23 ENCOUNTER — Telehealth: Payer: Self-pay

## 2015-11-23 NOTE — Telephone Encounter (Signed)
UDS: 11/07/2015  Negative for Alprazolam: PRN: Rx'ed regularly   Moderate risk per Dr. Larose Kells 11/22/2015

## 2015-11-29 ENCOUNTER — Encounter: Payer: Self-pay | Admitting: Internal Medicine

## 2015-12-07 ENCOUNTER — Ambulatory Visit (INDEPENDENT_AMBULATORY_CARE_PROVIDER_SITE_OTHER): Payer: Medicare Other | Admitting: Medical

## 2015-12-07 ENCOUNTER — Encounter: Payer: Self-pay | Admitting: Medical

## 2015-12-07 ENCOUNTER — Other Ambulatory Visit: Payer: Self-pay | Admitting: Medical

## 2015-12-07 VITALS — BP 128/86 | HR 77 | Temp 97.7°F | Ht 69.0 in | Wt 219.8 lb

## 2015-12-07 DIAGNOSIS — J209 Acute bronchitis, unspecified: Secondary | ICD-10-CM | POA: Diagnosis not present

## 2015-12-07 DIAGNOSIS — J0100 Acute maxillary sinusitis, unspecified: Secondary | ICD-10-CM

## 2015-12-07 MED ORDER — AZITHROMYCIN 250 MG PO TABS: ORAL_TABLET | ORAL | Status: DC

## 2015-12-07 MED ORDER — BENZONATATE 100 MG PO CAPS: 100.0000 mg | ORAL_CAPSULE | Freq: Three times a day (TID) | ORAL | Status: DC | PRN

## 2015-12-07 MED ORDER — FLUTICASONE PROPIONATE 50 MCG/ACT NA SUSP: 2.0000 | Freq: Every day | NASAL | Status: DC

## 2015-12-07 NOTE — Progress Notes (Signed)
Pre visit review using our clinic review tool, if applicable. No additional management support is needed unless otherwise documented below in the visit note. 

## 2015-12-07 NOTE — Patient Instructions (Addendum)
You appear to have bronchitis and sinusitis. Rest hydrate and tylenol for fever. I am prescribing benzonatate  cough medicine, and azithromycin antibiotic. For your nasal congestion flonase  For your ear wax recommend using debrox otc to soften wax. Then follow up to wash wax out.  Follow up in 7-10 days or as needed

## 2015-12-07 NOTE — Progress Notes (Signed)
Subjective:    Patient ID: Robert Krause, male    DOB: 01-24-1934, 82 y.o.   MRN: [id number]  HPI  Pt in with 2 days of fever, st and chest congestion. Pt has some runny nose. Also some sinus pressure. When blows his nose get colored mucous. Also productive cough. No chills or sweats.   No diffuse body aches.    Review of Systems  Constitutional: Positive for fever and fatigue. Negative for chills.  HENT: Positive for congestion, postnasal drip and sinus pressure. Negative for ear discharge, ear pain, facial swelling and nosebleeds.   Respiratory: Positive for cough. Negative for chest tightness, shortness of breath and wheezing.   Cardiovascular: Negative for chest pain and palpitations.  Gastrointestinal: Negative for abdominal pain.  Musculoskeletal: Negative for back pain.  Skin: Negative for pallor and rash.  Neurological: Negative for dizziness, numbness and headaches.  Hematological: Negative for adenopathy. Does not bruise/bleed easily.  Psychiatric/Behavioral: Negative for confusion and dysphoric mood.     Past Medical History  Diagnosis Date  . Hyperlipidemia   . Hypertension   . Chronic sinusitis   . Chronic interstitial cystitis     Dr Amalia Hailey  . Allergic rhinitis   . Osteoarthritis   . Anxiety   . Glaucoma   . BPH (benign prostatic hyperplasia)   . [place]     right inferior clavicle, shave    Social History   Social History  . Marital Status: Married    Spouse Name: N/A  . Number of Children: 1  . Years of Education: N/A   Occupational History  . retired- [employer]    Social History Main Topics  . Smoking status: Never Smoker   . Smokeless tobacco: Never Used  . Alcohol Use: 1.8 oz/week    3 Glasses of wine per week  . Drug Use: No  . Sexual Activity: Not on file   Other Topics Concern  . Not on file   Social History Narrative   Born in [place], raised in [place], moved to [place] in the 60s    Past Surgical History  Procedure Laterality  Date  . Transurethral resection of prostate    . Colonoscopy    . Polypectomy    . Appendectomy    . Cataract extraction Right 06-2015    Family History  Problem Relation Age of Onset  . Anxiety disorder Other   . Hyperlipidemia Father   . Hypertension Father   . CAD Father   . Colon cancer Neg Hx   . Prostate cancer Neg Hx     No Known Allergies  Current Outpatient Prescriptions on File Prior to Visit  Medication Sig Dispense Refill  . ALPRAZolam (XANAX) 0.25 MG tablet Take 1 tablet (0.25 mg total) by mouth every 8 (eight) hours as needed for anxiety. 90 tablet 2  . irbesartan-hydrochlorothiazide (AVALIDE) 150-12.5 MG per tablet Take 1 tablet by mouth daily. 30 tablet 11  . pravastatin (PRAVACHOL) 10 MG tablet Take 1 tablet (10 mg total) by mouth daily. 90 tablet 3  . Tamsulosin HCl (FLOMAX) 0.4 MG CAPS Take 1 capsule by mouth Daily.    . timolol (TIMOPTIC) 0.5 % ophthalmic solution PLACE 1 DROP INTO BOTH EYES TWICE DAILY 10 mL 5   No current facility-administered medications on file prior to visit.    BP 128/86 mmHg  Pulse 77  Temp(Src) 97.7 F (36.5 C) (Oral)  Ht 5\' 9"  (1.753 m)  Wt 219 lb 12.8 oz (99.701 kg)  BMI 32.44 kg/m2  SpO2 97%       Objective:   Physical Exam  General  Mental Status - Alert. General Appearance - Well groomed. Not in acute distress.  Skin Rashes- No Rashes.  HEENT Head- Normal. Ear Auditory Canal - Left- wax Right - waxTympanic Membrane- Left- Normal. Right- Normal. Eye Sclera/Conjunctiva- Left- Normal. Right- Normal. Nose & Sinuses Nasal Mucosa- Left-  Boggy and Congested. Right-  Boggy and  Congested.Bilateral maxillary and frontal sinus pressure. Mouth & Throat Lips: Upper Lip- Normal: no dryness, cracking, pallor, cyanosis, or vesicular eruption. Lower Lip-Normal: no dryness, cracking, pallor, cyanosis or vesicular eruption. Buccal Mucosa- Bilateral- No Aphthous ulcers. Oropharynx- No Discharge or Erythema. +pnd. Tonsils:  Characteristics- Bilateral- No Erythema or Congestion. Size/Enlargement- Bilateral- No enlargement. Discharge- bilateral-None.  Neck Neck- Supple. No Masses.   Chest and Lung Exam Auscultation: Breath Sounds:-Clear even and unlabored.  Cardiovascular Auscultation:Rythm- Regular, rate and rhythm. Murmurs & Other Heart Sounds:Ausculatation of the heart reveal- No Murmurs.  Lymphatic Head & Neck General Head & Neck Lymphatics: Bilateral: Description- No Localized lymphadenopathy.       Assessment & Plan:   You appear to have bronchitis and sinusitis. Rest hydrate and tylenol for fever. I am prescribing benzonatate  cough medicine, and azithromycin antibiotic. For your nasal congestion flonase  For your ear wax recommend using debrox otc to soften wax. Then follow up to wash wax out.  Follow up in 7-10 days or as needed

## 2016-01-08 ENCOUNTER — Other Ambulatory Visit: Payer: Self-pay | Admitting: Medical

## 2016-01-09 ENCOUNTER — Other Ambulatory Visit: Payer: Self-pay | Admitting: Medical

## 2016-01-24 DIAGNOSIS — N411 Chronic prostatitis: Secondary | ICD-10-CM | POA: Diagnosis not present

## 2016-01-24 DIAGNOSIS — R102 Pelvic and perineal pain: Secondary | ICD-10-CM | POA: Diagnosis not present

## 2016-01-31 ENCOUNTER — Encounter: Payer: Self-pay | Admitting: Gastroenterology

## 2016-02-06 ENCOUNTER — Other Ambulatory Visit: Payer: Self-pay | Admitting: Medical

## 2016-02-07 ENCOUNTER — Ambulatory Visit (INDEPENDENT_AMBULATORY_CARE_PROVIDER_SITE_OTHER): Payer: Medicare Other | Admitting: Internal Medicine

## 2016-02-07 ENCOUNTER — Encounter: Payer: Self-pay | Admitting: Internal Medicine

## 2016-02-07 VITALS — BP 132/68 | HR 58 | Temp 97.9°F | Ht 69.0 in | Wt 225.0 lb

## 2016-02-07 DIAGNOSIS — H6123 Impacted cerumen, bilateral: Secondary | ICD-10-CM

## 2016-02-07 DIAGNOSIS — Z09 Encounter for follow-up examination after completed treatment for conditions other than malignant neoplasm: Secondary | ICD-10-CM

## 2016-02-07 DIAGNOSIS — F411 Generalized anxiety disorder: Secondary | ICD-10-CM

## 2016-02-07 DIAGNOSIS — I10 Essential (primary) hypertension: Secondary | ICD-10-CM | POA: Diagnosis not present

## 2016-02-07 LAB — BASIC METABOLIC PANEL
BUN: 20 mg/dL (ref 6–23)
CALCIUM: 9.1 mg/dL (ref 8.4–10.5)
CO2: 33 mEq/L — ABNORMAL HIGH (ref 19–32)
Chloride: 103 mEq/L (ref 96–112)
Creatinine, Ser: 1.02 mg/dL (ref 0.40–1.50)
GFR: 74.82 mL/min (ref >60.00)
GLUCOSE: 93 mg/dL (ref 70–99)
Potassium: 4 mEq/L (ref 3.5–5.1)
SODIUM: 138 meq/L (ref 135–145)

## 2016-02-07 NOTE — Patient Instructions (Signed)
GO TO THE LAB :      Get the blood work     GO TO THE FRONT DESK Schedule your next appointment for a  Physical exam  When?   By [id number] Fasting?  Yes  Use OTC peroxide twice a week , few drops in each ear

## 2016-02-07 NOTE — Progress Notes (Signed)
Pre visit review using our clinic review tool, if applicable. No additional management support is needed unless otherwise documented below in the visit note. 

## 2016-02-07 NOTE — Progress Notes (Signed)
Subjective:    Patient ID: Robert Krause, male    DOB: [birth year], 82 y.o.   MRN: [id number]  DOS:  02/07/2016 Type of visit - description : Routine visit Interval history:  Anxiety: Recent UDS high risk, we had a long discussion about the issue. He uses Xanax very seldom, anxiety triggered by interstitial cystitis, he is actually asking for refills but not using them consistently. Complaining of ear pain bilaterally on and off for a while. No actual discharge. Recent sinusitis better.  Review of Systems No chest pain or difficulty breathing  Past Medical History  Diagnosis Date  . Hyperlipidemia   . Hypertension   . Chronic sinusitis   . Chronic interstitial cystitis     Dr Amalia Hailey  . Allergic rhinitis   . Osteoarthritis   . Anxiety   . Glaucoma   . BPH (benign prostatic hyperplasia)   . Venous [place]     right inferior clavicle, shave    Past Surgical History  Procedure Laterality Date  . Transurethral resection of prostate    . Colonoscopy    . Polypectomy    . Appendectomy    . Cataract extraction Right 06-2015    Social History   Social History  . Marital Status: Married    Spouse Name: N/A  . Number of Children: 1  . Years of Education: N/A   Occupational History  . retired- [employer]    Social History Main Topics  . Smoking status: Never Smoker   . Smokeless tobacco: Never Used  . Alcohol Use: 1.8 oz/week    3 Glasses of wine per week  . Drug Use: No  . Sexual Activity: Not on file   Other Topics Concern  . Not on file   Social History Narrative   Born in [place], raised in [place], moved to [place] in the 60s        Medication List       This list is accurate as of: 02/07/16 11:59 PM.  Always use your most recent med list.               ALPRAZolam 0.25 MG tablet  Commonly known as:  XANAX  Take 1 tablet (0.25 mg total) by mouth every 8 (eight) hours as needed for anxiety.     fluticasone 50 MCG/ACT nasal spray  Commonly known as:  FLONASE    SHAKE LIQUID AND USE 2 SPRAYS IN EACH NOSTRIL DAILY     irbesartan-hydrochlorothiazide 150-12.5 MG tablet  Commonly known as:  AVALIDE  TAKE 1 TABLET BY MOUTH EVERY DAY     pravastatin 10 MG tablet  Commonly known as:  PRAVACHOL  Take 1 tablet (10 mg total) by mouth daily.     tamsulosin 0.4 MG Caps capsule  Commonly known as:  FLOMAX  Take 1 capsule by mouth Daily.     timolol 0.5 % ophthalmic solution  Commonly known as:  TIMOPTIC  PLACE 1 DROP INTO BOTH EYES TWICE DAILY           Objective:   Physical Exam BP 132/68 mmHg  Pulse 58  Temp(Src) 97.9 F (36.6 C) (Oral)  Ht 5\' 9"  (1.753 m)  Wt 225 lb (102.059 kg)  BMI 33.21 kg/m2  SpO2 97% General:   Well developed, well nourished . NAD.  HEENT:  Normocephalic . Face symmetric, atraumatic. Abundant  wax bilaterally, removed with a spoon, TMs normal. Lungs:  CTA B Normal respiratory effort, no intercostal retractions, no accessory muscle  use. Heart: RRR,  no murmur.  No pretibial edema bilaterally  Skin: Not pale. Not jaundice Neurologic:  alert & oriented X3.  Speech normal, gait appropriate for age and unassisted Psych--  Cognition and judgment appear intact.  Cooperative with normal attention span and concentration.  Behavior appropriate. No anxious or depressed appearing.      Assessment & Plan:   Assessment Prediabetes HTN Bradycardia: Holter 2016 essentially negative Hyperlipidemia Anxiety: on xanax, stress related to interstitial cystitis Insomnia: On melatonin, not interested in other medications DJD Obesity: BMI 32 Bradycardia:  Holter 07/2015  ok Glaucoma GU: Dr. Amalia Hailey --BPH, TURP --Interstitial cystitis [place]ous [place], right inferior clavicle  PLAN Anxiety, triggered  by interstitial cystitis, uses Xanax sporadically, 3-4 times a week, that is the reason why the UDS show no alprazolam. He however asks for RXs frequently but does not use them. We agreed to continue with Xanax as needed,  next prescription will be for only 30 tablets. Recheck UDS in few months, given his explanation his UDS is now low risk. Cerumen impaction: Abundant cerumen removed by me with a spoon bilaterally. HTN: Continue Avalide, check a BMP RTC 06-2015, CPX

## 2016-02-08 NOTE — Assessment & Plan Note (Signed)
Anxiety, triggered  by interstitial cystitis, uses Xanax sporadically, 3-4 times a week, that is the reason why the UDS show no alprazolam. He however asks for RXs frequently but does not use them. We agreed to continue with Xanax as needed, next prescription will be for only 30 tablets. Recheck UDS in few months, given his explanation his UDS is now low risk. Cerumen impaction: Abundant cerumen removed by me with a spoon bilaterally. HTN: Continue Avalide, check a BMP RTC 06-2015, CPX

## 2016-03-02 ENCOUNTER — Other Ambulatory Visit: Payer: Self-pay | Admitting: Medical

## 2016-03-26 ENCOUNTER — Ambulatory Visit (INDEPENDENT_AMBULATORY_CARE_PROVIDER_SITE_OTHER): Payer: Medicare Other | Admitting: Internal Medicine

## 2016-03-26 ENCOUNTER — Encounter: Payer: Self-pay | Admitting: Internal Medicine

## 2016-03-26 VITALS — BP 100/50 | HR 51 | Temp 97.6°F | Ht 69.0 in | Wt 221.4 lb

## 2016-03-26 DIAGNOSIS — Z09 Encounter for follow-up examination after completed treatment for conditions other than malignant neoplasm: Secondary | ICD-10-CM

## 2016-03-26 DIAGNOSIS — R1011 Right upper quadrant pain: Secondary | ICD-10-CM | POA: Diagnosis not present

## 2016-03-26 NOTE — Progress Notes (Signed)
Pre visit review using our clinic review tool, if applicable. No additional management support is needed unless otherwise documented below in the visit note. 

## 2016-03-26 NOTE — Patient Instructions (Signed)
Go to the lab: Provide a urine sample We'll schedule ultrasound If you have severe symptoms, fever, chills, diarrhea, a rash: call the office If you are not back to normal ina  couple of weeks --- let me know

## 2016-03-26 NOTE — Progress Notes (Signed)
Subjective:    Patient ID: Robert Krause, male    DOB: 25-Jun-1934, 82 y.o.   MRN: [id number]  DOS:  03/26/2016 Type of visit - description : Acute visit, new issue Interval history: Right sided abdominal pain for the last 7 days, on and off, when the pain comes it lasts few hours, no change with food, no associated rash, described as sharp. Most noticeable at the right upper quadrant but sometimes extends downwards. Has not taken any medication for it Also, BP is slightly low today but he feels at baseline.  Review of Systems  Denies fever chills No chest pain or cough No nausea, vomiting, diarrhea Has chronic dysuria without gross hematuria difficulty urinating  Past Medical History  Diagnosis Date  . Hyperlipidemia   . Hypertension   . Chronic sinusitis   . Chronic interstitial cystitis     Dr Amalia Hailey  . Allergic rhinitis   . Osteoarthritis   . Anxiety   . Glaucoma   . BPH (benign prostatic hyperplasia)   . Venous [place]     right inferior clavicle, shave    Past Surgical History  Procedure Laterality Date  . Transurethral resection of prostate    . Colonoscopy    . Polypectomy    . Appendectomy    . Cataract extraction Right 06-2015    Social History   Social History  . Marital Status: Married    Spouse Name: N/A  . Number of Children: 1  . Years of Education: N/A   Occupational History  . retired- [employer]    Social History Main Topics  . Smoking status: Never Smoker   . Smokeless tobacco: Never Used  . Alcohol Use: 1.8 oz/week    3 Glasses of wine per week  . Drug Use: No  . Sexual Activity: Not on file   Other Topics Concern  . Not on file   Social History Narrative   Born in [place], raised in [place], moved to [place] in the 60s        Medication List       This list is accurate as of: 03/26/16 11:59 PM.  Always use your most recent med list.               ALPRAZolam 0.25 MG tablet  Commonly known as:  XANAX  Take 1 tablet (0.25 mg total) by  mouth every 8 (eight) hours as needed for anxiety.     irbesartan-hydrochlorothiazide 150-12.5 MG tablet  Commonly known as:  AVALIDE  TAKE 1 TABLET BY MOUTH EVERY DAY     pravastatin 10 MG tablet  Commonly known as:  PRAVACHOL  Take 1 tablet (10 mg total) by mouth daily.     tamsulosin 0.4 MG Caps capsule  Commonly known as:  FLOMAX  Take 1 capsule by mouth Daily.     timolol 0.5 % ophthalmic solution  Commonly known as:  TIMOPTIC  PLACE 1 DROP INTO BOTH EYES TWICE DAILY           Objective:   Physical Exam BP 100/50 mmHg  Pulse 51  Temp(Src) 97.6 F (36.4 C) (Oral)  Ht 5\' 9"  (1.753 m)  Wt 221 lb 6.4 oz (100.426 kg)  BMI 32.68 kg/m2  SpO2 98% General:   Well developed, well nourished . NAD.  HEENT:  Normocephalic . Face symmetric, atraumatic Lungs:  CTA B Normal respiratory effort, no intercostal retractions, no accessory muscle use. Heart: RRR,  no murmur.  no pretibial edema bilaterally  Abdomen:  Not distended, soft, non-tender. No rebound or rigidity. No organomegaly  Skin: No rash at the abdomen or back Neurologic:  alert & oriented X3.  Speech normal, gait appropriate for age and unassisted Psych--  Cognition and judgment appear intact.  Cooperative with normal attention span and concentration.  Behavior appropriate. No anxious or depressed appearing.    Assessment & Plan:   Assessment Prediabetes HTN Bradycardia: Holter 2016 essentially negative Hyperlipidemia Anxiety: on xanax, stress related to interstitial cystitis Insomnia: On melatonin, not interested in other medications DJD Obesity: BMI 32 Bradycardia:  Holter 07/2015  ok Glaucoma GU: Dr. Amalia Hailey --BPH, TURP --Interstitial cystitis [place], right inferior clavicle  PLAN Right sided abdominal pain: One-week history of right-sided abdominal pain, atypical for GB, UTI or kidney stones. He has a history of appendectomy. We will check a UA, noting he has interstitial cystitis. Also  ultrasound to rule out gallbladder disease , kidney stone. Otherwise observation, see instructions.  RTC 06-2015, CPX

## 2016-03-27 LAB — URINALYSIS, ROUTINE W REFLEX MICROSCOPIC
BILIRUBIN URINE: NEGATIVE
Ketones, ur: NEGATIVE
LEUKOCYTES UA: NEGATIVE
Nitrite: NEGATIVE
PH: 5.5 (ref 5.0–8.0)
RBC / HPF: NONE SEEN (ref 0–?)
Specific Gravity, Urine: 1.02 (ref 1.000–1.030)
TOTAL PROTEIN, URINE-UPE24: NEGATIVE
Urine Glucose: NEGATIVE
Urobilinogen, UA: 0.2 (ref 0.0–1.0)
WBC, UA: NONE SEEN (ref 0–?)

## 2016-03-27 NOTE — Assessment & Plan Note (Signed)
Right sided abdominal pain: One-week history of right-sided abdominal pain, atypical for GB, UTI or kidney stones. He has a history of appendectomy. We will check a UA, noting he has interstitial cystitis. Also ultrasound to rule out gallbladder disease , kidney stone. Otherwise observation, see instructions.  RTC 06-2015, CPX

## 2016-03-28 ENCOUNTER — Other Ambulatory Visit: Payer: Self-pay | Admitting: Medical

## 2016-03-28 LAB — URINE CULTURE
COLONY COUNT: NO GROWTH
Organism ID, Bacteria: NO GROWTH

## 2016-04-01 ENCOUNTER — Ambulatory Visit (HOSPITAL_BASED_OUTPATIENT_CLINIC_OR_DEPARTMENT_OTHER)
Admission: RE | Admit: 2016-04-01 | Discharge: 2016-04-01 | Disposition: A | Discharge status: Home / Self Care | Payer: Medicare Other | Source: Ambulatory Visit | Attending: Internal Medicine | Admitting: Internal Medicine

## 2016-04-01 DIAGNOSIS — R1011 Right upper quadrant pain: Secondary | ICD-10-CM | POA: Diagnosis not present

## 2016-04-01 DIAGNOSIS — R109 Unspecified abdominal pain: Secondary | ICD-10-CM | POA: Diagnosis not present

## 2016-04-24 ENCOUNTER — Ambulatory Visit (INDEPENDENT_AMBULATORY_CARE_PROVIDER_SITE_OTHER): Payer: Medicare Other | Admitting: Internal Medicine

## 2016-04-24 ENCOUNTER — Encounter: Payer: Self-pay | Admitting: Internal Medicine

## 2016-04-24 VITALS — BP 122/58 | HR 47 | Temp 98.2°F | Ht 69.0 in | Wt 221.4 lb

## 2016-04-24 DIAGNOSIS — R1011 Right upper quadrant pain: Secondary | ICD-10-CM | POA: Insufficient documentation

## 2016-04-24 HISTORY — DX: Right upper quadrant pain: R10.11

## 2016-04-24 LAB — CBC WITH DIFFERENTIAL/PLATELET
BASOS PCT: 1 % (ref 0.0–3.0)
Basophils Absolute: 0.1 10*3/uL (ref 0.0–0.1)
Eosinophils Absolute: 0.3 10*3/uL (ref 0.0–0.7)
Eosinophils Relative: 4.5 % (ref 0.0–5.0)
HEMATOCRIT: 45 % (ref 39.0–52.0)
HEMOGLOBIN: 15.3 g/dL (ref 13.0–17.0)
LYMPHS PCT: 28.3 % (ref 12.0–46.0)
Lymphs Abs: 2 10*3/uL (ref 0.7–4.0)
MCHC: 34 g/dL (ref 30.0–36.0)
MCV: 92.7 fl (ref 78.0–100.0)
MONO ABS: 0.7 10*3/uL (ref 0.1–1.0)
Monocytes Relative: 10.2 % (ref 3.0–12.0)
Neutro Abs: 3.9 10*3/uL (ref 1.4–7.7)
Neutrophils Relative %: 56 % (ref 43.0–77.0)
Platelets: 188 10*3/uL (ref 150.0–400.0)
RBC: 4.85 Mil/uL (ref 4.22–5.81)
RDW: 13.2 % (ref 11.5–15.5)
WBC: 7 10*3/uL (ref 4.0–10.5)

## 2016-04-24 LAB — COMPREHENSIVE METABOLIC PANEL
ALBUMIN: 3.8 g/dL (ref 3.5–5.2)
ALT: 20 U/L (ref 0–53)
AST: 21 U/L (ref 0–37)
Alkaline Phosphatase: 57 U/L (ref 39–117)
BUN: 26 mg/dL — AB (ref 6–23)
CO2: 32 meq/L (ref 19–32)
Calcium: 9.2 mg/dL (ref 8.4–10.5)
Chloride: 103 mEq/L (ref 96–112)
Creatinine, Ser: 1.29 mg/dL (ref 0.40–1.50)
GFR: 57.03 mL/min — ABNORMAL LOW (ref >60.00)
Glucose, Bld: 109 mg/dL — ABNORMAL HIGH (ref 70–99)
POTASSIUM: 4.4 meq/L (ref 3.5–5.1)
SODIUM: 138 meq/L (ref 135–145)
Total Bilirubin: 0.8 mg/dL (ref 0.2–1.2)
Total Protein: 7 g/dL (ref 6.0–8.3)

## 2016-04-24 NOTE — Assessment & Plan Note (Signed)
R sided abd pain: mild but persistent sx, occ L sided pain as well; ultrasound results discussed in detail, L renal  parapelvic cyst not likely the cause of sx  . Plan- CMP, CBC, CT abd-pelvis. If w/u neg and sx persist consider GI ref RTC 06-2015, CPX

## 2016-04-24 NOTE — Progress Notes (Signed)
Subjective:    Patient ID: Robert Krause, male    DOB: 1934-05-07, 82 y.o.   MRN: [id number]  DOS:  04/24/2016 Type of visit - description :  Interval history: Since the last office visit he continue w/  abdominal pain, has upper abdominal pain, right side on and off. Usually has sx every 3- 4 days, symptoms last one day. Occasionally has a discomfort on the left upper abdomen as well. Symptoms are not worse by eating and they are mild, 3/10 Also concerned about the findings on the ultrasound  Review of Systems No F/C, no weight loss. No nausea, vomiting, blood in the stools. Urinary symptoms d/t interstitial cystitis at baseline. No gross hematuria.  Past Medical History  Diagnosis Date  . Hyperlipidemia   . Hypertension   . Chronic sinusitis   . Chronic interstitial cystitis     Dr Amalia Hailey  . Allergic rhinitis   . Osteoarthritis   . Anxiety   . Glaucoma   . BPH (benign prostatic hyperplasia)   . Venous [place]     right inferior clavicle, shave    Past Surgical History  Procedure Laterality Date  . Transurethral resection of prostate    . Colonoscopy    . Polypectomy    . Appendectomy    . Cataract extraction Right 06-2015    Social History   Social History  . Marital Status: Married    Spouse Name: N/A  . Number of Children: 1  . Years of Education: N/A   Occupational History  . retired- [employer]    Social History Main Topics  . Smoking status: Never Smoker   . Smokeless tobacco: Never Used  . Alcohol Use: 1.8 oz/week    3 Glasses of wine per week  . Drug Use: No  . Sexual Activity: Not on file   Other Topics Concern  . Not on file   Social History Narrative   Born in [place], raised in [place], moved to [place] in the 60s        Medication List       This list is accurate as of: 04/24/16 12:52 PM.  Always use your most recent med list.               ALPRAZolam 0.25 MG tablet  Commonly known as:  XANAX  Take 1 tablet (0.25 mg total) by mouth every 8  (eight) hours as needed for anxiety.     irbesartan-hydrochlorothiazide 150-12.5 MG tablet  Commonly known as:  AVALIDE  TAKE 1 TABLET BY MOUTH EVERY DAY     pravastatin 10 MG tablet  Commonly known as:  PRAVACHOL  Take 1 tablet (10 mg total) by mouth daily.     tamsulosin 0.4 MG Caps capsule  Commonly known as:  FLOMAX  Take 1 capsule by mouth Daily.     timolol 0.5 % ophthalmic solution  Commonly known as:  TIMOPTIC  PLACE 1 DROP INTO BOTH EYES TWICE DAILY           Objective:   Physical Exam BP 122/58 mmHg  Pulse 47  Temp(Src) 98.2 F (36.8 C) (Oral)  Ht 5\' 9"  (1.753 m)  Wt 221 lb 6 oz (100.415 kg)  BMI 32.68 kg/m2  SpO2 97% General:   Well developed, well nourished . NAD.  HEENT:  Normocephalic . Face symmetric, atraumatic Lungs:  CTA B Normal respiratory effort, no intercostal retractions, no accessory muscle use. Heart: RRR,  no murmur.  no pretibial edema bilaterally  Abdomen:  Not distended, soft, non-tender. No rebound or rigidity.   Skin: Not pale. Not jaundice Neurologic:  alert & oriented X3.  Speech normal, gait appropriate for age and unassisted Psych--  Cognition and judgment appear intact.  Cooperative with normal attention span and concentration.  Behavior appropriate. No anxious or depressed appearing.     Assessment & Plan:   Assessment Prediabetes HTN Bradycardia: Holter 2016 essentially negative Hyperlipidemia Anxiety: on xanax, stress related to interstitial cystitis Insomnia: On melatonin, not interested in other medications DJD Obesity: BMI 32 Glaucoma GU: Dr. Amalia Hailey --BPH, TURP --Interstitial cystitis [place], right inferior clavicle  PLAN R sided abd pain: mild but persistent sx, occ L sided pain as well; ultrasound results discussed in detail, L renal  parapelvic cyst not likely the cause of sx  . Plan- CMP, CBC, CT abd-pelvis. If w/u neg and sx persist consider GI ref RTC 06-2015, CPX

## 2016-04-24 NOTE — Progress Notes (Signed)
Pre visit review using our clinic review tool, if applicable. No additional management support is needed unless otherwise documented below in the visit note. 

## 2016-04-24 NOTE — Patient Instructions (Addendum)
GO TO THE LAB : Get the blood work    Will schedule a CT of the abdomen

## 2016-04-30 ENCOUNTER — Ambulatory Visit (HOSPITAL_BASED_OUTPATIENT_CLINIC_OR_DEPARTMENT_OTHER)
Admission: RE | Admit: 2016-04-30 | Discharge: 2016-04-30 | Disposition: A | Discharge status: Home / Self Care | Payer: Medicare Other | Source: Ambulatory Visit | Attending: Internal Medicine | Admitting: Internal Medicine

## 2016-04-30 ENCOUNTER — Encounter (HOSPITAL_BASED_OUTPATIENT_CLINIC_OR_DEPARTMENT_OTHER): Payer: Self-pay

## 2016-04-30 DIAGNOSIS — R911 Solitary pulmonary nodule: Secondary | ICD-10-CM | POA: Insufficient documentation

## 2016-04-30 DIAGNOSIS — R1011 Right upper quadrant pain: Secondary | ICD-10-CM | POA: Insufficient documentation

## 2016-04-30 DIAGNOSIS — R938 Abnormal findings on diagnostic imaging of other specified body structures: Secondary | ICD-10-CM | POA: Insufficient documentation

## 2016-04-30 DIAGNOSIS — M5136 Other intervertebral disc degeneration, lumbar region: Secondary | ICD-10-CM | POA: Diagnosis not present

## 2016-04-30 DIAGNOSIS — R1010 Upper abdominal pain, unspecified: Secondary | ICD-10-CM | POA: Diagnosis not present

## 2016-04-30 DIAGNOSIS — N433 Hydrocele, unspecified: Secondary | ICD-10-CM | POA: Insufficient documentation

## 2016-04-30 MED ORDER — IOPAMIDOL (ISOVUE-300) INJECTION 61%
100.0000 mL | Freq: Once | INTRAVENOUS | Status: AC | PRN
  Administered 2016-04-30: 100 mL via INTRAVENOUS

## 2016-05-03 ENCOUNTER — Other Ambulatory Visit: Payer: Self-pay | Admitting: Medical

## 2016-05-05 ENCOUNTER — Other Ambulatory Visit: Payer: Self-pay | Admitting: Internal Medicine

## 2016-07-08 ENCOUNTER — Encounter: Payer: Self-pay | Admitting: Internal Medicine

## 2016-07-08 ENCOUNTER — Ambulatory Visit (INDEPENDENT_AMBULATORY_CARE_PROVIDER_SITE_OTHER): Payer: Medicare Other | Admitting: Internal Medicine

## 2016-07-08 VITALS — BP 124/76 | HR 49 | Temp 97.8°F | Resp 14 | Ht 69.0 in | Wt 220.4 lb

## 2016-07-08 DIAGNOSIS — E785 Hyperlipidemia, unspecified: Secondary | ICD-10-CM | POA: Diagnosis not present

## 2016-07-08 DIAGNOSIS — Z0000 Encounter for general adult medical examination without abnormal findings: Secondary | ICD-10-CM

## 2016-07-08 DIAGNOSIS — Z23 Encounter for immunization: Secondary | ICD-10-CM | POA: Diagnosis not present

## 2016-07-08 DIAGNOSIS — Z79899 Other long term (current) drug therapy: Secondary | ICD-10-CM | POA: Diagnosis not present

## 2016-07-08 DIAGNOSIS — I10 Essential (primary) hypertension: Secondary | ICD-10-CM

## 2016-07-08 DIAGNOSIS — R739 Hyperglycemia, unspecified: Secondary | ICD-10-CM | POA: Diagnosis not present

## 2016-07-08 LAB — BASIC METABOLIC PANEL
BUN: 17 mg/dL (ref 6–23)
CHLORIDE: 102 meq/L (ref 96–112)
CO2: 30 mEq/L (ref 19–32)
CREATININE: 1.01 mg/dL (ref 0.40–1.50)
Calcium: 8.9 mg/dL (ref 8.4–10.5)
GFR: 75.6 mL/min (ref >60.00)
Glucose, Bld: 116 mg/dL — ABNORMAL HIGH (ref 70–99)
POTASSIUM: 4.3 meq/L (ref 3.5–5.1)
Sodium: 136 mEq/L (ref 135–145)

## 2016-07-08 LAB — LIPID PANEL
CHOLESTEROL: 147 mg/dL (ref 0–200)
HDL: 49.8 mg/dL (ref >39.00)
LDL CALC: 84 mg/dL (ref 0–99)
NonHDL: 97.24
TRIGLYCERIDES: 67 mg/dL (ref 0.0–149.0)
Total CHOL/HDL Ratio: 3
VLDL: 13.4 mg/dL (ref 0.0–40.0)

## 2016-07-08 NOTE — Assessment & Plan Note (Addendum)
Td -2017 ; pnm 23-- 2004, per pt had a booster 2016 @ [facility]; prevanr  2015; zostavax-- 2014 per pt @ his pharmacy  Last cscope 2012, several polyps, next  2017 ; discuss pro-cons of a  colonoscopy, in his particular case he need a follow-up colonoscopy not a  screening one.  Recommend to contact GI. States he well. Prostate ca screening-- per urology Discussed diet-exercise

## 2016-07-08 NOTE — Patient Instructions (Signed)
Get your blood work before you leave   Next visit in 8 months     Fall Prevention and Home Safety Falls cause injuries and can affect all age groups. It is possible to use preventive measures to significantly decrease the likelihood of falls. There are many simple measures which can make your home safer and prevent falls. OUTDOORS  Repair cracks and edges of walkways and driveways.  Remove high doorway thresholds.  Trim shrubbery on the main path into your home.  Have good outside lighting.  Clear walkways of tools, rocks, debris, and clutter.  Check that handrails are not broken and are securely fastened. Both sides of steps should have handrails.  Have leaves, snow, and ice cleared regularly.  Use sand or salt on walkways during winter months.  In the garage, clean up grease or oil spills. BATHROOM  Install night lights.  Install grab bars by the toilet and in the tub and shower.  Use non-skid mats or decals in the tub or shower.  Place a plastic non-slip stool in the shower to sit on, if needed.  Keep floors dry and clean up all water on the floor immediately.  Remove soap buildup in the tub or shower on a regular basis.  Secure bath mats with non-slip, double-sided rug tape.  Remove throw rugs and tripping hazards from the floors. BEDROOMS  Install night lights.  Make sure a bedside light is easy to reach.  Do not use oversized bedding.  Keep a telephone by your bedside.  Have a firm chair with side arms to use for getting dressed.  Remove throw rugs and tripping hazards from the floor. KITCHEN  Keep handles on pots and pans turned toward the center of the stove. Use back burners when possible.  Clean up spills quickly and allow time for drying.  Avoid walking on wet floors.  Avoid hot utensils and knives.  Position shelves so they are not too high or low.  Place commonly used objects within easy reach.  If necessary, use a sturdy step stool  with a grab bar when reaching.  Keep electrical cables out of the way.  Do not use floor polish or wax that makes floors slippery. If you must use wax, use non-skid floor wax.  Remove throw rugs and tripping hazards from the floor. STAIRWAYS  Never leave objects on stairs.  Place handrails on both sides of stairways and use them. Fix any loose handrails. Make sure handrails on both sides of the stairways are as long as the stairs.  Check carpeting to make sure it is firmly attached along stairs. Make repairs to worn or loose carpet promptly.  Avoid placing throw rugs at the top or bottom of stairways, or properly secure the rug with carpet tape to prevent slippage. Get rid of throw rugs, if possible.  Have an electrician put in a light switch at the top and bottom of the stairs. OTHER FALL PREVENTION TIPS  Wear low-heel or rubber-soled shoes that are supportive and fit well. Wear closed toe shoes.  When using a stepladder, make sure it is fully opened and both spreaders are firmly locked. Do not climb a closed stepladder.  Add color or contrast paint or tape to grab bars and handrails in your home. Place contrasting color strips on first and last steps.  Learn and use mobility aids as needed. Install an electrical emergency response system.  Turn on lights to avoid dark areas. Replace light bulbs that burn out immediately.   Get light switches that glow.  Arrange furniture to create clear pathways. Keep furniture in the same place.  Firmly attach carpet with non-skid or double-sided tape.  Eliminate uneven floor surfaces.  Select a carpet pattern that does not visually hide the edge of steps.  Be aware of all pets. OTHER HOME SAFETY TIPS  Set the water temperature for 120 F (48.8 C).  Keep emergency numbers on or near the telephone.  Keep smoke detectors on every level of the home and near sleeping areas. Document Released: 10/31/2002 Document Revised: 05/11/2012  Document Reviewed: 01/30/2012 ExitCare Patient Information 2015 [employer]. This information is not intended to replace advice given to you by your health care provider. Make sure you discuss any questions you have with your health care provider.   Preventive Care for Adults Ages 65 and over  Blood pressure check.** / Every 1 to 2 years.  Lipid and cholesterol check.**/ Every 5 years beginning at age 20.  Lung cancer screening. / Every year if you are aged 55-80 years and have a 30-pack-year history of smoking and currently smoke or have quit within the past 15 years. Yearly screening is stopped once you have quit smoking for at least 15 years or develop a health problem that would prevent you from having lung cancer treatment.  Fecal occult blood test (FOBT) of stool. / Every year beginning at age 50 and continuing until age 75. You may not have to do this test if you get a colonoscopy every 10 years.  Flexible sigmoidoscopy** or colonoscopy.** / Every 5 years for a flexible sigmoidoscopy or every 10 years for a colonoscopy beginning at age 50 and continuing until age 75.  Hepatitis C blood test.** / For all people born from 1945 through 1965 and any individual with known risks for hepatitis C.  Abdominal aortic aneurysm (AAA) screening.** / A one-time screening for ages 65 to 75 years who are current or former smokers.  Skin self-exam. / Monthly.  Influenza vaccine. / Every year.  Tetanus, diphtheria, and acellular pertussis (Tdap/Td) vaccine.** / 1 dose of Td every 10 years.  Varicella vaccine.** / Consult your health care provider.  Zoster vaccine.** / 1 dose for adults aged 60 years or older.  Pneumococcal 13-valent conjugate (PCV13) vaccine.** / Consult your health care provider.  Pneumococcal polysaccharide (PPSV23) vaccine.** / 1 dose for all adults aged 65 years and older.  Meningococcal vaccine.** / Consult your health care provider.  Hepatitis A vaccine.** /  Consult your health care provider.  Hepatitis B vaccine.** / Consult your health care provider.  Haemophilus influenzae type b (Hib) vaccine.** / Consult your health care provider. **Family history and personal history of risk and conditions may change your health care provider's recommendations. Document Released: 01/06/2002 Document Revised: 11/15/2013 Document Reviewed: 04/07/2011 ExitCare Patient Information 2015 [employer]. This information is not intended to replace advice given to you by your health care provider. Make sure you discuss any questions you have with your health care provider.   

## 2016-07-08 NOTE — Progress Notes (Signed)
Pre visit review using our clinic review tool, if applicable. No additional management support is needed unless otherwise documented below in the visit note. 

## 2016-07-08 NOTE — Progress Notes (Signed)
Subjective:    Patient ID: Robert Krause, male    DOB: 07-Feb-1934, 82 y.o.   MRN: [id number]  DOS:  07/08/2016 Type of visit - description :  Here for Medicare AWV:  1. Risk factors based on Past M, S, F history: reviewed 2. Physical Activities:  Walks almost qd 3. Depression/mood: neg screening , occ anxiety related to bladder issues  4. Hearing:  No problems noted or reported  5. ADL's: independent, drives  6. Fall Risk: no recent falls, prevention discussed , see AVS 7. home Safety: does feel safe at home  8. Height, weight, & visual acuity: see VS, s/p R cataract surgery 06-2015; due for a visit, encouraged to call  9. Counseling: provided 10. Labs ordered based on risk factors: if needed  11. Referral Coordination: if needed 12. Care Plan, see assessment and plan , written personalized plan provided , see AVS 13. Cognitive Assessment: motor skills and cognition appropriate for age 65. Care team updated 15. End-of-life care , has a HC-POA  In addition, today we discussed the following: Was seen with abdominal pain, workup negative, symptoms resolved HTN: Good compliance with medication, ambulatory BPs within normal High cholesterol: On Pravachol, no apparent side effects.   Review of Systems Constitutional: No fever. No chills. No unexplained wt changes. No unusual sweats  HEENT: No dental problems, no ear discharge, no facial swelling, no voice changes. No eye discharge, no eye  redness , no  intolerance to light   Respiratory: No wheezing , no  difficulty breathing. No cough , no mucus production  Cardiovascular: No CP, no leg swelling , no  Palpitations  GI: no nausea, no vomiting, no diarrhea , no  abdominal pain.  No blood in the stools. No dysphagia, no odynophagia    Endocrine: No polyphagia, no polyuria , no polydipsia  GU: Symptoms at baseline  Musculoskeletal: No joint swellings or unusual aches or pains  Skin: No change in the color of the skin,  palor , no  Rash  Allergic, immunologic: No environmental allergies , no  food allergies  Neurological: No dizziness no  syncope. No headaches. No diplopia, no slurred, no slurred speech, no motor deficits, no facial  Numbness  Hematological: No enlarged lymph nodes, no easy bruising , no unusual bleedings  Psychiatry: No suicidal ideas, no hallucinations, no beavior problems, no confusion.  No unusual/severe anxiety, no depression   Past Medical History:  Diagnosis Date  . Allergic rhinitis   . Anxiety   . BPH (benign prostatic hyperplasia)   . Chronic interstitial cystitis    Dr Amalia Hailey  . Chronic sinusitis   . Glaucoma   . Hyperlipidemia   . Hypertension   . Osteoarthritis   . Venous [place]    right inferior clavicle, shave    Past Surgical History:  Procedure Laterality Date  . APPENDECTOMY    . CATARACT EXTRACTION Right 06-2015  . POLYPECTOMY    . TRANSURETHRAL RESECTION OF PROSTATE      Social History   Social History  . Marital status: Married    Spouse name: N/A  . Number of children: 1  . Years of education: N/A   Occupational History  . retiredMuseum/gallery curator Retired   Social History Main Topics  . Smoking status: Never Smoker  . Smokeless tobacco: Never Used  . Alcohol use 1.8 oz/week    3 Glasses of wine per week  . Drug use: No  . Sexual activity: Not on file  Other Topics Concern  . Not on file   Social History Narrative   Born in [place], raised in [place], moved to [place] in the 60s     Family History  Problem Relation Age of Onset  . Anxiety disorder Other   . Hyperlipidemia Father   . Hypertension Father   . CAD Father   . Colon cancer Neg Hx   . Prostate cancer Neg Hx        Medication List       Accurate as of 07/08/16 11:59 PM. Always use your most recent med list.          ALPRAZolam 0.25 MG tablet Commonly known as:  XANAX Take 1 tablet (0.25 mg total) by mouth every 8 (eight) hours as needed for anxiety.     irbesartan-hydrochlorothiazide 150-12.5 MG tablet Commonly known as:  AVALIDE Take 1 tablet by mouth daily.   pravastatin 10 MG tablet Commonly known as:  PRAVACHOL Take 1 tablet (10 mg total) by mouth daily.   tamsulosin 0.4 MG Caps capsule Commonly known as:  FLOMAX Take 1 capsule by mouth Daily.   timolol 0.5 % ophthalmic solution Commonly known as:  TIMOPTIC PLACE 1 DROP INTO BOTH EYES TWICE DAILY          Objective:   Physical Exam BP 124/76 (BP Location: Left Arm, Patient Position: Sitting, Cuff Size: Normal)   Pulse (!) 49   Temp 97.8 F (36.6 C) (Oral)   Resp 14   Ht 5\' 9"  (1.753 m)   Wt 220 lb 6 oz (100 kg)   SpO2 97%   BMI 32.54 kg/m  General:   Well developed, well nourished . NAD.  HEENT:  Normocephalic . Face symmetric, atraumatic Lungs:  CTA B Normal respiratory effort, no intercostal retractions, no accessory muscle use. Heart: RRR,  no murmur.  no pretibial edema bilaterally  Abdomen:  Not distended, soft, non-tender. No rebound or rigidity.  Skin: Not pale. Not jaundice Neurologic:  alert & oriented X3.  Speech normal, gait appropriate for age and unassisted Psych--  Cognition and judgment appear intact.  Cooperative with normal attention span and concentration.  Behavior appropriate. No anxious or depressed appearing.    Assessment & Plan:   Assessment Prediabetes HTN Bradycardia: Holter 2016 essentially negative Hyperlipidemia Anxiety: on xanax, stress related to interstitial cystitis Insomnia: On melatonin, not interested in other medications DJD Obesity: BMI 32 Glaucoma GU: Dr. Amalia Hailey --BPH, TURP --Interstitial cystitis Venous [place], right inferior clavicle  PLAN Prediabetes: A1c is stable over time. HTN: Last creatinine 1.29, recheck a BMP, continue with Hyzaar Hyperlipidemia: Continue Pravachol, check a FLP, recent LFTs normal Anxiety: Related to interstitial cystitis, check a UDS Abdominal pain: Previous visit, labs  were unremarkable, CT no acute findings. Sx resolved RTC 8 months.

## 2016-07-09 NOTE — Assessment & Plan Note (Signed)
Prediabetes: A1c is stable over time. HTN: Last creatinine 1.29, recheck a BMP, continue with Hyzaar Hyperlipidemia: Continue Pravachol, check a FLP, recent LFTs normal Anxiety: Related to interstitial cystitis, check a UDS Abdominal pain: Previous visit, labs were unremarkable, CT no acute findings. Sx resolved RTC 8 months.

## 2016-07-15 ENCOUNTER — Telehealth: Payer: Self-pay

## 2016-07-15 NOTE — Telephone Encounter (Signed)
UDS: 07/08/2016  Negative for Alprazolam:PRN  -Rx'ed 06/2015 #90 and 2RF -Rx'ed 10/2015 #90 and 2RF -Test negative, takes PRN, maybe negative due to short Xanax half-life  Low risk per Dr. Larose Kells 07/15/2016

## 2016-07-31 ENCOUNTER — Telehealth: Payer: Self-pay | Admitting: Internal Medicine

## 2016-07-31 DIAGNOSIS — N3010 Interstitial cystitis (chronic) without hematuria: Secondary | ICD-10-CM | POA: Diagnosis not present

## 2016-07-31 DIAGNOSIS — R102 Pelvic and perineal pain: Secondary | ICD-10-CM | POA: Diagnosis not present

## 2016-07-31 DIAGNOSIS — R109 Unspecified abdominal pain: Secondary | ICD-10-CM | POA: Diagnosis not present

## 2016-07-31 DIAGNOSIS — R300 Dysuria: Secondary | ICD-10-CM | POA: Diagnosis not present

## 2016-07-31 DIAGNOSIS — N411 Chronic prostatitis: Secondary | ICD-10-CM | POA: Diagnosis not present

## 2016-07-31 NOTE — Telephone Encounter (Signed)
[facility] [place], [place] - [phone] (Phone) [phone] (Fax)   pharmacy called to inform PCP patient received PreNar flu vaccination 5/5/12016 at pharmacy.

## 2016-08-01 NOTE — Telephone Encounter (Signed)
Immunization record updated.

## 2016-10-20 ENCOUNTER — Other Ambulatory Visit: Payer: Self-pay

## 2016-10-20 MED ORDER — PRAVASTATIN SODIUM 10 MG PO TABS: 10.0000 mg | ORAL_TABLET | Freq: Every day | ORAL | 3 refills | Status: DC

## 2016-10-22 DIAGNOSIS — L300 Nummular dermatitis: Secondary | ICD-10-CM | POA: Diagnosis not present

## 2016-12-08 ENCOUNTER — Encounter: Payer: Self-pay | Admitting: Internal Medicine

## 2016-12-08 ENCOUNTER — Ambulatory Visit (INDEPENDENT_AMBULATORY_CARE_PROVIDER_SITE_OTHER): Payer: Medicare Other | Admitting: Internal Medicine

## 2016-12-08 VITALS — BP 132/84 | HR 59 | Temp 97.9°F | Resp 14 | Ht 69.0 in | Wt 220.0 lb

## 2016-12-08 DIAGNOSIS — J029 Acute pharyngitis, unspecified: Secondary | ICD-10-CM

## 2016-12-08 DIAGNOSIS — R509 Fever, unspecified: Secondary | ICD-10-CM

## 2016-12-08 LAB — POCT INFLUENZA A/B
INFLUENZA A, POC: NEGATIVE
Influenza B, POC: NEGATIVE

## 2016-12-08 LAB — POCT RAPID STREP A (OFFICE): RAPID STREP A SCREEN: POSITIVE — AB

## 2016-12-08 MED ORDER — AMOXICILLIN 875 MG PO TABS
875.0000 mg | ORAL_TABLET | Freq: Two times a day (BID) | ORAL | 0 refills | Status: DC
Start: 2016-12-08 — End: 2016-12-15

## 2016-12-08 NOTE — Progress Notes (Signed)
Subjective:    Patient ID: Robert Krause, male    DOB: 1934-05-24, 83 y.o.   MRN: [id number]  DOS:  12/08/2016 Type of visit - description : acute  Interval history: Symptoms started about a week ago and got worse 3 days ago. Having fevers on and off, subjective. His main symptom is sore throat but he also has sinus and chest congestion   Review of Systems Denies nausea or vomiting No rash Minimal myalgias + Sputum production, yellow in color  Past Medical History:  Diagnosis Date  . Allergic rhinitis   . Anxiety   . BPH (benign prostatic hyperplasia)   . Chronic interstitial cystitis    Dr Amalia Hailey  . Chronic sinusitis   . Glaucoma   . Hyperlipidemia   . Hypertension   . Osteoarthritis   . Venous [place]    right inferior clavicle, shave    Past Surgical History:  Procedure Laterality Date  . APPENDECTOMY    . CATARACT EXTRACTION Right 06-2015  . POLYPECTOMY    . TRANSURETHRAL RESECTION OF PROSTATE      Social History   Social History  . Marital status: Married    Spouse name: N/A  . Number of children: 1  . Years of education: N/A   Occupational History  . retiredMuseum/gallery curator Retired   Social History Main Topics  . Smoking status: Never Smoker  . Smokeless tobacco: Never Used  . Alcohol use 1.8 oz/week    3 Glasses of wine per week  . Drug use: No  . Sexual activity: Not on file   Other Topics Concern  . Not on file   Social History Narrative   Born in [place], raised in [place], moved to [place] in the 60s      Allergies as of 12/08/2016   No Known Allergies     Medication List       Accurate as of 12/08/16 11:59 PM. Always use your most recent med list.          ALPRAZolam 0.25 MG tablet Commonly known as:  XANAX Take 1 tablet (0.25 mg total) by mouth every 8 (eight) hours as needed for anxiety.   amoxicillin 875 MG tablet Commonly known as:  AMOXIL Take 1 tablet (875 mg total) by mouth 2 (two) times daily.   irbesartan-hydrochlorothiazide  150-12.5 MG tablet Commonly known as:  AVALIDE Take 1 tablet by mouth daily.   pravastatin 10 MG tablet Commonly known as:  PRAVACHOL Take 1 tablet (10 mg total) by mouth daily.   tamsulosin 0.4 MG Caps capsule Commonly known as:  FLOMAX Take 1 capsule by mouth Daily.   timolol 0.5 % ophthalmic solution Commonly known as:  TIMOPTIC PLACE 1 DROP INTO BOTH EYES TWICE DAILY          Objective:   Physical Exam BP 132/84 (BP Location: Right Arm, Patient Position: Sitting, Cuff Size: Normal)   Pulse (!) 59   Temp 97.9 F (36.6 C) (Oral)   Resp 14   Ht 5\' 9"  (1.753 m)   Wt 220 lb (99.8 kg)   SpO2 98%   BMI 32.49 kg/m  General:   Well developed, well nourished . NAD.  HEENT:  Normocephalic . Face symmetric, atraumatic. TMs normal, throat symmetric, slightly red, no discharge. Nose is slightly congested Lungs:  CTA B Normal respiratory effort, no intercostal retractions, no accessory muscle use. Heart: RRR,  no murmur.  No pretibial edema bilaterally  Skin: Not pale. Not jaundice Neurologic:  alert & oriented X3.  Speech normal, gait appropriate for age and unassisted Psych--  Cognition and judgment appear intact.  Cooperative with normal attention span and concentration.  Behavior appropriate. No anxious or depressed appearing.      Assessment & Plan:   Assessment Prediabetes HTN Bradycardia: Holter 2016 essentially negative Hyperlipidemia Anxiety: on xanax, stress related to interstitial cystitis Insomnia: On melatonin, not interested in other medications DJD Obesity: BMI 32 Glaucoma GU: Dr. Amalia Hailey --BPH, TURP --Interstitial cystitis [place]ous [place], right inferior clavicle  PLAN Strep throat: Patient presents with fever and chief complaint sore throat, flu test negative, strep a test +. Treatment with amoxicillin for 10 days otherwise supportive treatment. See AVS. Recommend to use a mask as he will be probably contagious for the next couple of days.

## 2016-12-08 NOTE — Patient Instructions (Signed)
  Rest, fluids , tylenol  For cough:  Take Mucinex DM twice a day as needed until better  For nasal congestion: Use OTC Nasocort or Flonase : 2 nasal sprays on each side of the nose in the morning until you feel better  Avoid decongestants such as  Pseudoephedrine or phenylephrine   Take the antibiotic as prescribed  (Amoxicillin)  Call if not gradually better over the next  10 days  Call anytime if the symptoms are severe  Use a mask

## 2016-12-08 NOTE — Progress Notes (Signed)
Pre visit review using our clinic review tool, if applicable. No additional management support is needed unless otherwise documented below in the visit note. 

## 2016-12-09 NOTE — Assessment & Plan Note (Signed)
Strep throat: Patient presents with fever and chief complaint sore throat, flu test negative, strep a test +. Treatment with amoxicillin for 10 days otherwise supportive treatment. See AVS. Recommend to use a mask as he will be probably contagious for the next couple of days.

## 2016-12-15 ENCOUNTER — Encounter: Payer: Self-pay | Admitting: Internal Medicine

## 2016-12-15 ENCOUNTER — Ambulatory Visit (INDEPENDENT_AMBULATORY_CARE_PROVIDER_SITE_OTHER): Payer: Medicare Other | Admitting: Internal Medicine

## 2016-12-15 VITALS — BP 116/70 | HR 61 | Temp 97.7°F | Resp 14 | Ht 69.0 in | Wt 221.2 lb

## 2016-12-15 DIAGNOSIS — J020 Streptococcal pharyngitis: Secondary | ICD-10-CM

## 2016-12-15 MED ORDER — AZITHROMYCIN 250 MG PO TABS: ORAL_TABLET | ORAL | 0 refills | Status: DC

## 2016-12-15 NOTE — Assessment & Plan Note (Signed)
Strep throat: was dx with strep throat, documented by a rapid strep test. On amoxicillin, not feeling any better. Patient reports that historically, amoxicillin doesn't work for him. Interestingly, he also had a + rapid strep test last year. Plan: Throat culture, Z-Pak. Call if not better.

## 2016-12-15 NOTE — Patient Instructions (Signed)
Change amoxicillin to Zithromax  Call if not gradually improving

## 2016-12-15 NOTE — Progress Notes (Signed)
Pre visit review using our clinic review tool, if applicable. No additional management support is needed unless otherwise documented below in the visit note. 

## 2016-12-15 NOTE — Progress Notes (Signed)
Subjective:    Patient ID: Robert Krause, male    DOB: 1934/07/08, 83 y.o.   MRN: [id number]  DOS:  12/15/2016 Type of visit - description : Acute visit Interval history: DX with strep throat recently, on amoxicillin, feels no better. Continue with sore throat, cough, on and off frontal headache. No further fevers. States sore throat is at least moderate. Admits to yellow discharge from the nose, + postnasal dripping, occasional yellow sputum  Review of Systems Denies nausea, vomiting. No myalgias.   Past Medical History:  Diagnosis Date  . Allergic rhinitis   . Anxiety   . BPH (benign prostatic hyperplasia)   . Chronic interstitial cystitis    Dr Amalia Hailey  . Chronic sinusitis   . Glaucoma   . Hyperlipidemia   . Hypertension   . Osteoarthritis   . Venous [place]    right inferior clavicle, shave    Past Surgical History:  Procedure Laterality Date  . APPENDECTOMY    . CATARACT EXTRACTION Right 06-2015  . POLYPECTOMY    . TRANSURETHRAL RESECTION OF PROSTATE      Social History   Social History  . Marital status: Married    Spouse name: N/A  . Number of children: 1  . Years of education: N/A   Occupational History  . retiredMuseum/gallery curator Retired   Social History Main Topics  . Smoking status: Never Smoker  . Smokeless tobacco: Never Used  . Alcohol use 1.8 oz/week    3 Glasses of wine per week  . Drug use: No  . Sexual activity: Not on file   Other Topics Concern  . Not on file   Social History Narrative   Born in [place], raised in [place], moved to [place] in the 60s      Allergies as of 12/15/2016   No Known Allergies     Medication List       Accurate as of 12/15/16  5:57 PM. Always use your most recent med list.          ALPRAZolam 0.25 MG tablet Commonly known as:  XANAX Take 1 tablet (0.25 mg total) by mouth every 8 (eight) hours as needed for anxiety.   azithromycin 250 MG tablet Commonly known as:  ZITHROMAX Z-PAK 2 tabs a day the first day, then  1 tab a day x 4 days   irbesartan-hydrochlorothiazide 150-12.5 MG tablet Commonly known as:  AVALIDE Take 1 tablet by mouth daily.   pravastatin 10 MG tablet Commonly known as:  PRAVACHOL Take 1 tablet (10 mg total) by mouth daily.   tamsulosin 0.4 MG Caps capsule Commonly known as:  FLOMAX Take 1 capsule by mouth Daily.   timolol 0.5 % ophthalmic solution Commonly known as:  TIMOPTIC PLACE 1 DROP INTO BOTH EYES TWICE DAILY          Objective:   Physical Exam BP 116/70 (BP Location: Left Arm, Patient Position: Sitting, Cuff Size: Normal)   Pulse 61   Temp 97.7 F (36.5 C) (Oral)   Resp 14   Ht 5\' 9"  (1.753 m)   Wt 221 lb 4 oz (100.4 kg)   SpO2 97%   BMI 32.67 kg/m  General:   Well developed, well nourished . NAD.  HEENT:  Normocephalic . Face symmetric, atraumatic. TMs normal, throat: Symmetric, slightly red, no discharge. Nose congested. Sinuses no TTP. Neck: No obvious LADs Lungs:  CTA B Normal respiratory effort, no intercostal retractions, no accessory muscle use. Heart: RRR,  no  murmur.  No pretibial edema bilaterally  Skin: Not pale. Not jaundice Neurologic:  alert & oriented X3.  Speech normal, gait appropriate for age and unassisted Psych--  Cognition and judgment appear intact.  Cooperative with normal attention span and concentration.  Behavior appropriate. No anxious or depressed appearing.      Assessment & Plan:   Assessment Prediabetes HTN Bradycardia: Holter 2016 essentially negative Hyperlipidemia Anxiety: on xanax, stress related to interstitial cystitis Insomnia: On melatonin, not interested in other medications DJD Obesity: BMI 32 Glaucoma GU: Dr. Amalia Hailey --BPH, TURP --Interstitial cystitis [place]ous [place], right inferior clavicle  PLAN Strep throat: was dx with strep throat, documented by a rapid strep test. On amoxicillin, not feeling any better. Patient reports that historically, amoxicillin doesn't work for him. Interestingly,  he also had a + rapid strep test last year. Plan: Throat culture, Z-Pak. Call if not better.

## 2016-12-17 LAB — CULTURE, GROUP A STREP: ORGANISM ID, BACTERIA: NORMAL

## 2016-12-22 DIAGNOSIS — R1313 Dysphagia, pharyngeal phase: Secondary | ICD-10-CM | POA: Diagnosis not present

## 2016-12-22 DIAGNOSIS — R490 Dysphonia: Secondary | ICD-10-CM | POA: Diagnosis not present

## 2017-01-01 ENCOUNTER — Other Ambulatory Visit: Payer: Self-pay

## 2017-01-01 MED ORDER — IRBESARTAN-HYDROCHLOROTHIAZIDE 150-12.5 MG PO TABS: 1.0000 | ORAL_TABLET | Freq: Every day | ORAL | 1 refills | Status: DC

## 2017-01-27 ENCOUNTER — Ambulatory Visit (INDEPENDENT_AMBULATORY_CARE_PROVIDER_SITE_OTHER): Payer: Medicare Other | Admitting: Internal Medicine

## 2017-01-27 ENCOUNTER — Encounter: Payer: Self-pay | Admitting: Internal Medicine

## 2017-01-27 VITALS — BP 128/80 | HR 79 | Temp 98.1°F | Resp 14 | Ht 69.0 in | Wt 222.5 lb

## 2017-01-27 DIAGNOSIS — R739 Hyperglycemia, unspecified: Secondary | ICD-10-CM

## 2017-01-27 DIAGNOSIS — I10 Essential (primary) hypertension: Secondary | ICD-10-CM | POA: Diagnosis not present

## 2017-01-27 DIAGNOSIS — G5600 Carpal tunnel syndrome, unspecified upper limb: Secondary | ICD-10-CM | POA: Insufficient documentation

## 2017-01-27 DIAGNOSIS — M542 Cervicalgia: Secondary | ICD-10-CM | POA: Diagnosis not present

## 2017-01-27 HISTORY — DX: Carpal tunnel syndrome, unspecified upper limb: G56.00

## 2017-01-27 MED ORDER — PREDNISONE 10 MG PO TABS: ORAL_TABLET | ORAL | 0 refills | Status: DC

## 2017-01-27 MED ORDER — CYCLOBENZAPRINE HCL 10 MG PO TABS: 10.0000 mg | ORAL_TABLET | Freq: Every evening | ORAL | 0 refills | Status: DC | PRN

## 2017-01-27 NOTE — Patient Instructions (Addendum)
GO TO THE LAB : Get the blood work     GO TO THE FRONT DESK Schedule your next appointment for a  physical exam by 06-2017  For  neck pain: Warm  compress Take prednisone as prescribed Tylenol  500 mg OTC 2 tabs a day every 8 hours as needed for pain Also take a muscle relaxant: Flexeril at night, will cause drowsiness If you are not getting better in the next few days or if the symptoms come back please call the office  Prednisone may increase your blood sugar, check your blood sugars daily, if is more than 200 ---- please call the office

## 2017-01-27 NOTE — Assessment & Plan Note (Signed)
Neck pain: The pain is likely MSK/mechanical, hard to r/o radiculopathy d/t chronic CTS paresthesias. Rec a trial with prednisone, Tylenol and a muscle relaxant. S/e (drowsiness)  discussed. If no better let me know. HTN: Well-controlled on Avalide, due for a BMP. Pre- DM: Diet control, due for A1c, will check. Recommend to check CBGs while in prednisone. See instructions RTC 06-2017, CPX

## 2017-01-27 NOTE — Progress Notes (Signed)
Pre visit review using our clinic review tool, if applicable. No additional management support is needed unless otherwise documented below in the visit note. 

## 2017-01-27 NOTE — Progress Notes (Signed)
Subjective:    Patient ID: Robert Krause, male    DOB: [birth year], 83 y.o.   MRN: [id number]  DOS:  01/27/2017 Type of visit - description :  Acute visit Interval history: Symptoms started 2 or 3 weeks ago: Pain at the base of the lateral right neck, some radiation to the shoulder and up to the lateral aspect of the head Sx are on and off, may last few hours, increase with neck motion, does not increase with shoulder or arm motion. Denies any injury. I asked about numbness or paresthesias in the upper extremities, they are unchanged, history of CTS.    Review of Systems  Denies fever chills. Was recently seen with sore throat, he recuperated quickly. Denies any lower extremity paresthesias or gait difficulties  Past Medical History:  Diagnosis Date  . Allergic rhinitis   . Anxiety   . BPH (benign prostatic hyperplasia)   . Chronic interstitial cystitis    Dr Amalia Hailey  . Chronic sinusitis   . CTS (carpal tunnel syndrome) 01/27/2017  . Glaucoma   . Hyperlipidemia   . Hypertension   . Osteoarthritis   . [place]    right inferior clavicle, shave    Past Surgical History:  Procedure Laterality Date  . APPENDECTOMY    . CATARACT EXTRACTION Right 06-2015  . POLYPECTOMY    . TRANSURETHRAL RESECTION OF PROSTATE      Social History   Social History  . Marital status: Married    Spouse name: N/A  . Number of children: 1  . Years of education: N/A   Occupational History  . retiredMuseum/gallery curator Retired   Social History Main Topics  . Smoking status: Never Smoker  . Smokeless tobacco: Never Used  . Alcohol use 1.8 oz/week    3 Glasses of wine per week  . Drug use: No  . Sexual activity: Not on file   Other Topics Concern  . Not on file   Social History Narrative   Born in [place], raised in [place], moved to [place] in the 60s      Allergies as of 01/27/2017   No Known Allergies     Medication List       Accurate as of 01/27/17  9:42 PM. Always use your most recent med  list.          ALPRAZolam 0.25 MG tablet Commonly known as:  XANAX Take 1 tablet (0.25 mg total) by mouth every 8 (eight) hours as needed for anxiety.   cyclobenzaprine 10 MG tablet Commonly known as:  FLEXERIL Take 1 tablet (10 mg total) by mouth at bedtime as needed for muscle spasms.   irbesartan-hydrochlorothiazide 150-12.5 MG tablet Commonly known as:  AVALIDE Take 1 tablet by mouth daily.   pravastatin 10 MG tablet Commonly known as:  PRAVACHOL Take 1 tablet (10 mg total) by mouth daily.   predniSONE 10 MG tablet Commonly known as:  DELTASONE 4 tablets x 2 days, 3 tabs x 2 days, 2 tabs x 2 days, 1 tab x 2 days   tamsulosin 0.4 MG Caps capsule Commonly known as:  FLOMAX Take 1 capsule by mouth Daily.   timolol 0.5 % ophthalmic solution Commonly known as:  TIMOPTIC PLACE 1 DROP INTO BOTH EYES TWICE DAILY          Objective:   Physical Exam BP 128/80 (BP Location: Left Arm, Patient Position: Sitting, Cuff Size: Normal)   Pulse 79   Temp 98.1 F (36.7 C) (Oral)  Resp 14   Ht 5\' 9"  (1.753 m)   Wt 222 lb 8 oz (100.9 kg)   SpO2 98%   BMI 32.86 kg/m  General:   Well developed, well nourished . NAD.  HEENT:  Normocephalic . Face symmetric, atraumatic Neck: no TTP at the cervical spine, palpation without LAD is or mass. Supraclavicular area is normal. Range of motion is only slightly limited. Lungs:  CTA B Normal respiratory effort, no intercostal retractions, no accessory muscle use. Heart: RRR,  no murmur.  No pretibial edema bilaterally  MSK: Range of motion tested, slightly decreased but did not cause any pain.  Skin: Not pale. Not jaundice Neurologic:  alert & oriented X3.  Speech normal, gait appropriate for age and unassisted Strength symmetric, DTRs normal upper extremities, slightly decrease of the lower extremities in a symmetric fashion. Psych--  Cognition and judgment appear intact.  Cooperative with normal attention span and concentration.    Behavior appropriate. No anxious or depressed appearing.      Assessment & Plan:   Assessment Prediabetes HTN Bradycardia: Holter 2016 essentially negative Hyperlipidemia Anxiety: on xanax, stress related to interstitial cystitis Insomnia: On melatonin, not interested in other medications DJD CTS: B, offered surgery before  Obesity: BMI 32 Glaucoma GU: Dr. Amalia Hailey --BPH, TURP --Interstitial cystitis [place]ous [place], right inferior clavicle  PLAN Neck pain: The pain is likely MSK/mechanical, hard to r/o radiculopathy d/t chronic CTS paresthesias. Rec a trial with prednisone, Tylenol and a muscle relaxant. S/e (drowsiness)  discussed. If no better let me know. HTN: Well-controlled on Avalide, due for a BMP. Pre- DM: Diet control, due for A1c, will check. Recommend to check CBGs while in prednisone. See instructions RTC 06-2017, CPX

## 2017-01-28 LAB — BASIC METABOLIC PANEL
BUN: 18 mg/dL (ref 6–23)
CO2: 26 mEq/L (ref 19–32)
CREATININE: 1.07 mg/dL (ref 0.40–1.50)
Calcium: 9.1 mg/dL (ref 8.4–10.5)
Chloride: 101 mEq/L (ref 96–112)
GFR: 70.63 mL/min (ref >60.00)
GLUCOSE: 88 mg/dL (ref 70–99)
Potassium: 4 mEq/L (ref 3.5–5.1)
SODIUM: 137 meq/L (ref 135–145)

## 2017-01-28 LAB — HEMOGLOBIN A1C: Hgb A1c MFr Bld: 5.9 % (ref 4.6–6.5)

## 2017-01-29 DIAGNOSIS — M6289 Other specified disorders of muscle: Secondary | ICD-10-CM | POA: Diagnosis not present

## 2017-01-29 DIAGNOSIS — G8929 Other chronic pain: Secondary | ICD-10-CM | POA: Diagnosis not present

## 2017-01-29 DIAGNOSIS — R102 Pelvic and perineal pain: Secondary | ICD-10-CM | POA: Diagnosis not present

## 2017-01-29 DIAGNOSIS — N411 Chronic prostatitis: Secondary | ICD-10-CM | POA: Diagnosis not present

## 2017-03-09 ENCOUNTER — Ambulatory Visit: Payer: Medicare Other | Admitting: Internal Medicine

## 2017-03-19 ENCOUNTER — Ambulatory Visit: Payer: Medicare Other | Admitting: Internal Medicine

## 2017-03-31 DIAGNOSIS — L308 Other specified dermatitis: Secondary | ICD-10-CM | POA: Diagnosis not present

## 2017-04-13 ENCOUNTER — Ambulatory Visit (INDEPENDENT_AMBULATORY_CARE_PROVIDER_SITE_OTHER): Payer: Medicare Other | Admitting: Internal Medicine

## 2017-04-13 ENCOUNTER — Encounter: Payer: Self-pay | Admitting: Internal Medicine

## 2017-04-13 VITALS — BP 126/66 | HR 51 | Temp 97.9°F | Resp 14 | Ht 69.0 in | Wt 220.4 lb

## 2017-04-13 DIAGNOSIS — R5383 Other fatigue: Secondary | ICD-10-CM | POA: Diagnosis not present

## 2017-04-13 LAB — COMPREHENSIVE METABOLIC PANEL
ALBUMIN: 3.7 g/dL (ref 3.5–5.2)
ALK PHOS: 54 U/L (ref 39–117)
ALT: 15 U/L (ref 0–53)
AST: 17 U/L (ref 0–37)
BUN: 19 mg/dL (ref 6–23)
CALCIUM: 9.1 mg/dL (ref 8.4–10.5)
CO2: 29 mEq/L (ref 19–32)
CREATININE: 1.04 mg/dL (ref 0.40–1.50)
Chloride: 102 mEq/L (ref 96–112)
GFR: 72.95 mL/min (ref >60.00)
Glucose, Bld: 96 mg/dL (ref 70–99)
POTASSIUM: 4 meq/L (ref 3.5–5.1)
SODIUM: 136 meq/L (ref 135–145)
TOTAL PROTEIN: 6.6 g/dL (ref 6.0–8.3)
Total Bilirubin: 0.6 mg/dL (ref 0.2–1.2)

## 2017-04-13 LAB — CBC WITH DIFFERENTIAL/PLATELET
BASOS ABS: 0.1 10*3/uL (ref 0.0–0.1)
Basophils Relative: 1.1 % (ref 0.0–3.0)
Eosinophils Absolute: 0.3 10*3/uL (ref 0.0–0.7)
Eosinophils Relative: 5.3 % — ABNORMAL HIGH (ref 0.0–5.0)
HCT: 44.2 % (ref 39.0–52.0)
Hemoglobin: 15 g/dL (ref 13.0–17.0)
LYMPHS ABS: 1.5 10*3/uL (ref 0.7–4.0)
Lymphocytes Relative: 24.6 % (ref 12.0–46.0)
MCHC: 34 g/dL (ref 30.0–36.0)
MCV: 94.4 fl (ref 78.0–100.0)
MONO ABS: 0.6 10*3/uL (ref 0.1–1.0)
Monocytes Relative: 9.5 % (ref 3.0–12.0)
NEUTROS PCT: 59.5 % (ref 43.0–77.0)
Neutro Abs: 3.6 10*3/uL (ref 1.4–7.7)
Platelets: 186 10*3/uL (ref 150.0–400.0)
RBC: 4.68 Mil/uL (ref 4.22–5.81)
RDW: 13.1 % (ref 11.5–15.5)
WBC: 6.1 10*3/uL (ref 4.0–10.5)

## 2017-04-13 LAB — TSH: TSH: 1.58 u[IU]/mL (ref 0.35–4.50)

## 2017-04-13 NOTE — Progress Notes (Signed)
Pre visit review using our clinic review tool, if applicable. No additional management support is needed unless otherwise documented below in the visit note. 

## 2017-04-13 NOTE — Patient Instructions (Signed)
GO TO THE LAB : Get the blood work     GO TO THE FRONT DESK Schedule your next appointment for a  Check up in 6 weeks

## 2017-04-13 NOTE — Progress Notes (Signed)
Subjective:    Patient ID: Robert Krause, male    DOB: 18-Aug-1934, 83 y.o.   MRN: [id number]  DOS:  04/13/2017 Type of visit - description : acute Interval history: For the last 3 weeks he reports fatigue. He is okay with ADLs but when he goes to work on his yard, after 10-15 minutes he gets really fatigued and has to stop.  no associated shortness of breath, cough, chest pain or palpitations. Was able to work in his yard without major problems until 3 weeks ago. Not taking any new medications but had a shingrex 03/03/2017  Wt Readings from Last 3 Encounters:  04/13/17 220 lb 6 oz (100 kg)  01/27/17 222 lb 8 oz (100.9 kg)  12/15/16 221 lb 4 oz (100.4 kg)     Review of Systems Denies fever, chills No nausea, vomiting, diarrhea blood in the stools. No cough. Was seen with neck pain few weeks ago, symptoms resolved  Past Medical History:  Diagnosis Date  . Allergic rhinitis   . Anxiety   . BPH (benign prostatic hyperplasia)   . Chronic interstitial cystitis    Dr Amalia Hailey  . Chronic sinusitis   . CTS (carpal tunnel syndrome) 01/27/2017  . Glaucoma   . Hyperlipidemia   . Hypertension   . Osteoarthritis   . [place]    right inferior clavicle, shave    Past Surgical History:  Procedure Laterality Date  . APPENDECTOMY    . CATARACT EXTRACTION Right 06-2015  . POLYPECTOMY    . TRANSURETHRAL RESECTION OF PROSTATE      Social History   Social History  . Marital status: Married    Spouse name: N/A  . Number of children: 1  . Years of education: N/A   Occupational History  . retiredMuseum/gallery curator Retired   Social History Main Topics  . Smoking status: Never Smoker  . Smokeless tobacco: Never Used  . Alcohol use 1.8 oz/week    3 Glasses of wine per week  . Drug use: No  . Sexual activity: Not on file   Other Topics Concern  . Not on file   Social History Narrative   Born in [place], raised in [place], moved to [place] in the 60s      Allergies as of 04/13/2017   No  Known Allergies     Medication List       Accurate as of 04/13/17 11:59 PM. Always use your most recent med list.          ALPRAZolam 0.25 MG tablet Commonly known as:  XANAX Take 1 tablet (0.25 mg total) by mouth every 8 (eight) hours as needed for anxiety.   irbesartan-hydrochlorothiazide 150-12.5 MG tablet Commonly known as:  AVALIDE Take 1 tablet by mouth daily.   pravastatin 10 MG tablet Commonly known as:  PRAVACHOL Take 1 tablet (10 mg total) by mouth daily.   tamsulosin 0.4 MG Caps capsule Commonly known as:  FLOMAX Take 1 capsule by mouth Daily.   timolol 0.5 % ophthalmic solution Commonly known as:  TIMOPTIC PLACE 1 DROP INTO BOTH EYES TWICE DAILY          Objective:   Physical Exam BP 126/66 (BP Location: Left Arm, Patient Position: Sitting, Cuff Size: Small)   Pulse (!) 51   Temp 97.9 F (36.6 C) (Oral)   Resp 14   Ht 5\' 9"  (1.753 m)   Wt 220 lb 6 oz (100 kg)   SpO2 97%   BMI  32.54 kg/m  General:   Well developed, well nourished . NAD.  HEENT:  Normocephalic . Face symmetric, atraumatic. Neck: No JVD Lungs:  CTA B Normal respiratory effort, no intercostal retractions, no accessory muscle use. Heart: RRR,  no murmur.  no pretibial edema bilaterally  Abdomen:  Not distended, soft, non-tender. No rebound or rigidity.  Skin: Not pale. Not jaundice Neurologic:  alert & oriented X3.  Speech normal, gait appropriate for age and unassisted Psych--  Cognition and judgment appear intact.  Cooperative with normal attention span and concentration.  Behavior appropriate. No anxious or depressed appearing.    Assessment & Plan:   Assessment Prediabetes HTN Bradycardia: Holter 2016 essentially negative Hyperlipidemia Anxiety: on xanax, stress related to interstitial cystitis Insomnia: On melatonin, not interested in other medications DJD CTS: B, offered surgery before  Obesity: BMI 32 Glaucoma GU: Dr. Amalia Hailey --BPH, TURP --Interstitial  cystitis [place], right inferior clavicle  PLAN Fatigue: 83 year old gentleman with history of prediabetes, on no new medications except Shingrex few weeks ago, presents with lack of energy. EKG today shows sinus bradycardia, at baseline.  DDX: is large but includes deconditioning, anemia, others. He is bradycardic but at baseline. Will get a CMP, CBC, TSH. If normal, consider a stress test, angina equivalent? RTC 6 weeks

## 2017-04-14 NOTE — Assessment & Plan Note (Signed)
Fatigue: 83 year old gentleman with history of prediabetes, on no new medications except Shingrex few weeks ago, presents with lack of energy. EKG today shows sinus bradycardia, at baseline.  DDX: is large but includes deconditioning, anemia, others. He is bradycardic but at baseline. Will get a CMP, CBC, TSH. If normal, consider a stress test, angina equivalent? RTC 6 weeks

## 2017-05-25 ENCOUNTER — Ambulatory Visit (INDEPENDENT_AMBULATORY_CARE_PROVIDER_SITE_OTHER): Payer: Medicare Other | Admitting: Internal Medicine

## 2017-05-25 ENCOUNTER — Encounter: Payer: Self-pay | Admitting: Internal Medicine

## 2017-05-25 VITALS — BP 122/80 | HR 55 | Temp 97.5°F | Resp 14 | Ht 69.0 in | Wt 217.5 lb

## 2017-05-25 DIAGNOSIS — F411 Generalized anxiety disorder: Secondary | ICD-10-CM | POA: Diagnosis not present

## 2017-05-25 DIAGNOSIS — R5383 Other fatigue: Secondary | ICD-10-CM

## 2017-05-25 MED ORDER — ALPRAZOLAM 0.25 MG PO TABS: 0.2500 mg | ORAL_TABLET | Freq: Three times a day (TID) | ORAL | 3 refills | Status: DC | PRN

## 2017-05-25 NOTE — Patient Instructions (Signed)
Next sits for a complete physical exam in 3-4 months from today. If you have an earlier appointment please cancel it.  Will arrange a stress test in [place] to check your heart

## 2017-05-25 NOTE — Progress Notes (Signed)
Subjective:    Patient ID: Robert Krause, male    DOB: 27-Jun-1934, 83 y.o.   MRN: [id number]  DOS:  05/25/2017 Type of visit - description : Follow-up from previous visit Interval history: Since the last visit, energy has increased a little but is not back to baseline. Continue with insomnia, mostly due to episodes of interstitial cystitis. Needs a refill on Xanax.   Review of Systems Again denies chest pain, difficulty breathing or palpitations.  Past Medical History:  Diagnosis Date  . Allergic rhinitis   . Anxiety   . BPH (benign prostatic hyperplasia)   . Chronic interstitial cystitis    Dr Amalia Hailey  . Chronic sinusitis   . CTS (carpal tunnel syndrome) 01/27/2017  . Glaucoma   . Hyperlipidemia   . Hypertension   . Osteoarthritis   . [place]    right inferior clavicle, shave    Past Surgical History:  Procedure Laterality Date  . APPENDECTOMY    . CATARACT EXTRACTION Right 06-2015  . POLYPECTOMY    . TRANSURETHRAL RESECTION OF PROSTATE      Social History   Social History  . Marital status: Married    Spouse name: N/A  . Number of children: 1  . Years of education: N/A   Occupational History  . retiredMuseum/gallery curator Retired   Social History Main Topics  . Smoking status: Never Smoker  . Smokeless tobacco: Never Used  . Alcohol use 1.8 oz/week    3 Glasses of wine per week  . Drug use: No  . Sexual activity: Not on file   Other Topics Concern  . Not on file   Social History Narrative   Born in [place], raised in [place], moved to [place] in the 60s      Allergies as of 05/25/2017   No Known Allergies     Medication List       Accurate as of 05/25/17 11:59 PM. Always use your most recent med list.          ALPRAZolam 0.25 MG tablet Commonly known as:  XANAX Take 1 tablet (0.25 mg total) by mouth every 8 (eight) hours as needed for anxiety.   irbesartan-hydrochlorothiazide 150-12.5 MG tablet Commonly known as:  AVALIDE Take 1 tablet by mouth daily.   pravastatin 10 MG tablet Commonly known as:  PRAVACHOL Take 1 tablet (10 mg total) by mouth daily.   tamsulosin 0.4 MG Caps capsule Commonly known as:  FLOMAX Take 1 capsule by mouth Daily.   timolol 0.5 % ophthalmic solution Commonly known as:  TIMOPTIC PLACE 1 DROP INTO BOTH EYES TWICE DAILY          Objective:   Physical Exam BP 122/80 (BP Location: Left Arm, Patient Position: Sitting, Cuff Size: Normal)   Pulse (!) 55   Temp 97.5 F (36.4 C) (Oral)   Resp 14   Ht 5\' 9"  (1.753 m)   Wt 217 lb 8 oz (98.7 kg)   SpO2 97%   BMI 32.12 kg/m  General:   Well developed, well nourished . NAD.  HEENT:  Normocephalic . Face symmetric, atraumatic Lungs:  CTA B Normal respiratory effort, no intercostal retractions, no accessory muscle use. Heart: RRR,  no murmur.  No pretibial edema bilaterally  Skin: Not pale. Not jaundice Neurologic:  alert & oriented X3.  Speech normal, gait appropriate for age and unassisted Psych--  Cognition and judgment appear intact.  Cooperative with normal attention span and concentration.  Behavior appropriate. No  anxious or depressed appearing.      Assessment & Plan:   Assessment Prediabetes HTN Bradycardia: Holter 2016 essentially negative Hyperlipidemia Anxiety: on xanax, stress related to interstitial cystitis Insomnia: On melatonin, not interested in other medications DJD CTS: B, offered surgery before  Obesity: BMI 32 Glaucoma GU: Dr. Amalia Hailey --BPH, TURP --Interstitial cystitis [place], right inferior clavicle  PLAN Fatigue: Labs were negative, pro-cons of further testing discussed, we agreed on a Myoview to rule out angina equivalent. Anxiety, insomnia: Mostly due to interstitial cystitis, refill Xanax. RTC 3-4 months, CPX

## 2017-05-25 NOTE — Progress Notes (Signed)
Pre visit review using our clinic review tool, if applicable. No additional management support is needed unless otherwise documented below in the visit note. 

## 2017-05-27 NOTE — Assessment & Plan Note (Signed)
Fatigue: Labs were negative, pro-cons of further testing discussed, we agreed on a Myoview to rule out angina equivalent. Anxiety, insomnia: Mostly due to interstitial cystitis, refill Xanax. RTC 3-4 months, CPX

## 2017-06-10 ENCOUNTER — Telehealth (HOSPITAL_COMMUNITY): Payer: Self-pay | Admitting: *Deleted

## 2017-06-10 NOTE — Telephone Encounter (Signed)
Patient given detailed instructions per Myocardial Perfusion Study Information Sheet for the test on  06/12/17. Patient notified to arrive 15 minutes early and that it is imperative to arrive on time for appointment to keep from having the test rescheduled.  If you need to cancel or reschedule your appointment, please call the office within 24 hours of your appointment. . Patient verbalized understanding. Robert Krause    

## 2017-06-12 ENCOUNTER — Ambulatory Visit (HOSPITAL_COMMUNITY): Payer: Medicare Other | Attending: Cardiovascular Disease

## 2017-06-12 DIAGNOSIS — I2510 Atherosclerotic heart disease of native coronary artery without angina pectoris: Secondary | ICD-10-CM | POA: Diagnosis not present

## 2017-06-12 DIAGNOSIS — R5383 Other fatigue: Secondary | ICD-10-CM

## 2017-06-12 LAB — MYOCARDIAL PERFUSION IMAGING
CHL CUP NUCLEAR SSS: 3
CHL CUP RESTING HR STRESS: 49 {beats}/min
LVDIAVOL: 122 mL (ref 62–150)
LVSYSVOL: 56 mL
NUC STRESS TID: 1.03
Peak HR: 80 {beats}/min
RATE: 0.28
SDS: 2
SRS: 1

## 2017-06-12 MED ORDER — REGADENOSON 0.4 MG/5ML IV SOLN
0.4000 mg | Freq: Once | INTRAVENOUS | Status: AC
  Administered 2017-06-12: 0.4 mg via INTRAVENOUS

## 2017-06-12 MED ORDER — TECHNETIUM TC 99M TETROFOSMIN IV KIT
10.2000 | PACK | Freq: Once | INTRAVENOUS | Status: AC | PRN
  Administered 2017-06-12: 10.2 via INTRAVENOUS
  Filled 2017-06-12: qty 11

## 2017-06-12 MED ORDER — TECHNETIUM TC 99M TETROFOSMIN IV KIT
31.6000 | PACK | Freq: Once | INTRAVENOUS | Status: AC | PRN
  Administered 2017-06-12: 31.6 via INTRAVENOUS
  Filled 2017-06-12: qty 32

## 2017-06-28 ENCOUNTER — Other Ambulatory Visit: Payer: Self-pay | Admitting: Internal Medicine

## 2017-07-23 ENCOUNTER — Encounter: Payer: Medicare Other | Admitting: Internal Medicine

## 2017-08-03 ENCOUNTER — Encounter: Payer: Medicare Other | Admitting: Internal Medicine

## 2017-08-04 DIAGNOSIS — G8929 Other chronic pain: Secondary | ICD-10-CM | POA: Diagnosis not present

## 2017-08-04 DIAGNOSIS — N3010 Interstitial cystitis (chronic) without hematuria: Secondary | ICD-10-CM | POA: Diagnosis not present

## 2017-08-04 DIAGNOSIS — R102 Pelvic and perineal pain: Secondary | ICD-10-CM | POA: Diagnosis not present

## 2017-08-04 DIAGNOSIS — N411 Chronic prostatitis: Secondary | ICD-10-CM | POA: Diagnosis not present

## 2017-09-24 ENCOUNTER — Encounter: Payer: Medicare Other | Admitting: Internal Medicine

## 2017-09-25 ENCOUNTER — Encounter: Payer: Medicare Other | Admitting: Internal Medicine

## 2017-10-06 ENCOUNTER — Encounter: Payer: Self-pay | Admitting: Gastroenterology

## 2017-10-14 ENCOUNTER — Encounter: Payer: Self-pay | Admitting: Internal Medicine

## 2017-10-14 ENCOUNTER — Other Ambulatory Visit: Payer: Self-pay | Admitting: Internal Medicine

## 2017-10-14 ENCOUNTER — Ambulatory Visit (INDEPENDENT_AMBULATORY_CARE_PROVIDER_SITE_OTHER): Payer: Medicare Other | Admitting: Internal Medicine

## 2017-10-14 VITALS — BP 124/68 | HR 64 | Temp 97.9°F | Resp 14 | Ht 69.0 in | Wt 220.5 lb

## 2017-10-14 DIAGNOSIS — R110 Nausea: Secondary | ICD-10-CM | POA: Diagnosis not present

## 2017-10-14 DIAGNOSIS — K219 Gastro-esophageal reflux disease without esophagitis: Secondary | ICD-10-CM | POA: Diagnosis not present

## 2017-10-14 LAB — CBC WITH DIFFERENTIAL/PLATELET
Basophils Absolute: 0.1 10*3/uL (ref 0.0–0.1)
Basophils Relative: 1.9 % (ref 0.0–3.0)
EOS ABS: 0.3 10*3/uL (ref 0.0–0.7)
EOS PCT: 5.4 % — AB (ref 0.0–5.0)
HCT: 46.7 % (ref 39.0–52.0)
Hemoglobin: 15.6 g/dL (ref 13.0–17.0)
LYMPHS ABS: 1.5 10*3/uL (ref 0.7–4.0)
LYMPHS PCT: 24.2 % (ref 12.0–46.0)
MCHC: 33.5 g/dL (ref 30.0–36.0)
MCV: 95 fl (ref 78.0–100.0)
MONO ABS: 0.6 10*3/uL (ref 0.1–1.0)
MONOS PCT: 9.6 % (ref 3.0–12.0)
NEUTROS PCT: 58.9 % (ref 43.0–77.0)
Neutro Abs: 3.7 10*3/uL (ref 1.4–7.7)
Platelets: 192 10*3/uL (ref 150.0–400.0)
RBC: 4.92 Mil/uL (ref 4.22–5.81)
RDW: 12.9 % (ref 11.5–15.5)
WBC: 6.3 10*3/uL (ref 4.0–10.5)

## 2017-10-14 LAB — BASIC METABOLIC PANEL
BUN: 15 mg/dL (ref 6–23)
CO2: 30 mEq/L (ref 19–32)
Calcium: 9.2 mg/dL (ref 8.4–10.5)
Chloride: 102 mEq/L (ref 96–112)
Creatinine, Ser: 1 mg/dL (ref 0.40–1.50)
GFR: 76.23 mL/min (ref >60.00)
GLUCOSE: 108 mg/dL — AB (ref 70–99)
POTASSIUM: 4.2 meq/L (ref 3.5–5.1)
SODIUM: 137 meq/L (ref 135–145)

## 2017-10-14 MED ORDER — PANTOPRAZOLE SODIUM 40 MG PO TBEC: 40.0000 mg | DELAYED_RELEASE_TABLET | Freq: Two times a day (BID) | ORAL | 1 refills | Status: DC

## 2017-10-14 NOTE — Progress Notes (Signed)
Pre visit review using our clinic review tool, if applicable. No additional management support is needed unless otherwise documented below in the visit note. 

## 2017-10-14 NOTE — Progress Notes (Signed)
Subjective:    Patient ID: Robert Krause, male    DOB: Sep 15, 1934, 83 y.o.   MRN: [id number]  DOS:  10/14/2017 Type of visit - description : acute Interval history: 2-week history of nausea, on-off, now more steady, increases with meals. Stools are somewhat different, has had a BM daily but the amount of stool is smaller, a new symptom.  Also admits to taking ibuprofen daily, sometimes up to 4 tablets every day for back pain. Reports of ill-defined discomfort @ the  sides of the abdomen but no epigastric pain or burning. Admits to heartburn as well  Review of Systems No  fevers, vomiting, blood in the stools or diarrhea No dysphagia or odynophagia Occasionally mild headaches Has L UTS mostly urinary frequency and mild dysuria: At baseline.  Past Medical History:  Diagnosis Date  . Allergic rhinitis   . Anxiety   . BPH (benign prostatic hyperplasia)   . Chronic interstitial cystitis    Dr Amalia Hailey  . Chronic sinusitis   . CTS (carpal tunnel syndrome) 01/27/2017  . Glaucoma   . Hyperlipidemia   . Hypertension   . Osteoarthritis   . [place]    right inferior clavicle, shave    Past Surgical History:  Procedure Laterality Date  . APPENDECTOMY    . CATARACT EXTRACTION Right 06-2015  . POLYPECTOMY    . TRANSURETHRAL RESECTION OF PROSTATE      Social History   Socioeconomic History  . Marital status: Married    Spouse name: Not on file  . Number of children: 1  . Years of education: Not on file  . Highest education level: Not on file  Social Needs  . Financial resource strain: Not on file  . Food insecurity - worry: Not on file  . Food insecurity - inability: Not on file  . Transportation needs - medical: Not on file  . Transportation needs - non-medical: Not on file  Occupational History  . Occupation: retired- Company secretary: RETIRED  Tobacco Use  . Smoking status: Never Smoker  . Smokeless tobacco: Never Used  Substance and Sexual Activity  . Alcohol  use: Yes    Alcohol/week: 1.8 oz    Types: 3 Glasses of wine per week  . Drug use: No  . Sexual activity: Not on file  Other Topics Concern  . Not on file  Social History Narrative   Born in [place], raised in [place], moved to [place] in the 60s      Allergies as of 10/14/2017   No Known Allergies     Medication List        Accurate as of 10/14/17 11:59 PM. Always use your most recent med list.          ALPRAZolam 0.25 MG tablet Commonly known as:  XANAX Take 1 tablet (0.25 mg total) by mouth every 8 (eight) hours as needed for anxiety.   irbesartan-hydrochlorothiazide 150-12.5 MG tablet Commonly known as:  AVALIDE Take 1 tablet by mouth daily.   pantoprazole 40 MG tablet Commonly known as:  PROTONIX Take 1 tablet (40 mg total) by mouth 2 (two) times daily before a meal.   pravastatin 10 MG tablet Commonly known as:  PRAVACHOL Take 1 tablet (10 mg total) by mouth daily.   tamsulosin 0.4 MG Caps capsule Commonly known as:  FLOMAX Take 1 capsule by mouth Daily.   timolol 0.5 % ophthalmic solution Commonly known as:  TIMOPTIC PLACE 1 DROP INTO BOTH EYES  TWICE DAILY          Objective:   Physical Exam BP 124/68 (BP Location: Left Arm, Patient Position: Sitting, Cuff Size: Small)   Pulse 64   Temp 97.9 F (36.6 C) (Oral)   Resp 14   Ht 5\' 9"  (1.753 m)   Wt 220 lb 8 oz (100 kg)   SpO2 95%   BMI 32.56 kg/m   General:   Well developed, well nourished . NAD.  HEENT:  Normocephalic . Face symmetric, atraumatic.  Not pale Neck: No LAD or mass Lungs:  CTA B Normal respiratory effort, no intercostal retractions, no accessory muscle use. Heart: RRR,  no murmur.  no pretibial edema bilaterally  Abdomen:  Not distended, soft, non-tender. No rebound or rigidity.   Skin: Not pale. Not jaundice Neurologic:  alert & oriented X3.  Speech normal, gait appropriate for age and unassisted Psych--  Cognition and judgment appear intact.  Cooperative with normal  attention span and concentration.  Behavior appropriate. No anxious or depressed appearing.     Assessment & Plan:    Assessment Prediabetes HTN Bradycardia: Holter 2016 essentially negative Hyperlipidemia Anxiety: on xanax, stress related to interstitial cystitis Insomnia: On melatonin, not interested in other medications DJD CTS: B, offered surgery before  Obesity: BMI 32 Glaucoma GU: Dr. Amalia Hailey --BPH, TURP --Interstitial cystitis [place], right inferior clavicle  PLAN NAusea: New onset of nausea and heartburn for 2 weeks in the context of taking NSAIDs frequently.  Most likely has GERD and possibly gastritis or a ulcer. Abdominal exam is benign, will get a BMP and CBC to r/o anemia.  PPIs twice a day for 3 weeks then once daily. Stop NSAIDs, Tylenol is okay.  See AVS Has a follow-up with me in few days, will recheck symptoms then; has a GI appointment in January, recommend to keep it, he may need further investigation. Patient verbalized understanding

## 2017-10-14 NOTE — Patient Instructions (Signed)
GO TO THE LAB : Get the blood work     Take Protonix before breakfast and before dinner on an empty stomach for 3 weeks  After 3 weeks, take Protonix only 1 time a day before breakfast  Stop ibuprofen and any other anti-inflammatory OTC including naproxen, BCs, etc.  For pain, Tylenol 500 mg 2 tablets 3 times a day as needed and also use a heating pad.

## 2017-10-15 NOTE — Assessment & Plan Note (Signed)
NAusea: New onset of nausea and heartburn for 2 weeks in the context of taking NSAIDs frequently.  Most likely has GERD and possibly gastritis or a ulcer. Abdominal exam is benign, will get a BMP and CBC to r/o anemia.  PPIs twice a day for 3 weeks then once daily. Stop NSAIDs, Tylenol is okay.  See AVS Has a follow-up with me in few days, will recheck symptoms then; has a GI appointment in January, recommend to keep it, he may need further investigation. Patient verbalized understanding

## 2017-10-30 ENCOUNTER — Other Ambulatory Visit: Payer: Self-pay | Admitting: Internal Medicine

## 2017-11-04 ENCOUNTER — Ambulatory Visit (INDEPENDENT_AMBULATORY_CARE_PROVIDER_SITE_OTHER): Payer: Medicare Other | Admitting: Internal Medicine

## 2017-11-04 ENCOUNTER — Encounter: Payer: Self-pay | Admitting: Internal Medicine

## 2017-11-04 VITALS — BP 118/78 | HR 58 | Temp 97.8°F | Resp 14 | Ht 69.0 in | Wt 220.2 lb

## 2017-11-04 DIAGNOSIS — R739 Hyperglycemia, unspecified: Secondary | ICD-10-CM

## 2017-11-04 DIAGNOSIS — Z0000 Encounter for general adult medical examination without abnormal findings: Secondary | ICD-10-CM | POA: Diagnosis not present

## 2017-11-04 DIAGNOSIS — E785 Hyperlipidemia, unspecified: Secondary | ICD-10-CM | POA: Diagnosis not present

## 2017-11-04 NOTE — Progress Notes (Signed)
Subjective:    Patient ID: Robert Krause, male    DOB: 07/17/1934, 83 y.o.   MRN: [id number]  DOS:  11/04/2017 Type of visit - description : cpx Interval history: No major concerns.  Was seen with nausea and heartburn, feels better but symptoms not completely well.  Review of Systems  Other than above, a 14 point review of systems is negative     Past Medical History:  Diagnosis Date  . Allergic rhinitis   . Anxiety   . BPH (benign prostatic hyperplasia)   . Chronic interstitial cystitis    Dr Amalia Hailey  . Chronic sinusitis   . CTS (carpal tunnel syndrome) 01/27/2017  . Glaucoma   . Hyperlipidemia   . Hypertension   . Osteoarthritis   . Venous lake    right inferior clavicle, shave    Past Surgical History:  Procedure Laterality Date  . APPENDECTOMY    . CATARACT EXTRACTION Right 06-2015  . POLYPECTOMY    . TRANSURETHRAL RESECTION OF PROSTATE      Social History   Socioeconomic History  . Marital status: Married    Spouse name: Not on file  . Number of children: 1  . Years of education: Not on file  . Highest education level: Not on file  Social Needs  . Financial resource strain: Not on file  . Food insecurity - worry: Not on file  . Food insecurity - inability: Not on file  . Transportation needs - medical: Not on file  . Transportation needs - non-medical: Not on file  Occupational History  . Occupation: retired- Company secretary: RETIRED  Tobacco Use  . Smoking status: Never Smoker  . Smokeless tobacco: Never Used  Substance and Sexual Activity  . Alcohol use: Yes    Alcohol/week: 1.8 oz    Types: 3 Glasses of wine per week  . Drug use: No  . Sexual activity: Not on file  Other Topics Concern  . Not on file  Social History Narrative   Born in [place], raised in [place], moved to [place] in the 60s   Son lives in the [place]      Family History  Problem Relation Age of Onset  . Anxiety disorder Other   . Hyperlipidemia Father   . Hypertension Father    . CAD Father   . Colon cancer Neg Hx   . Prostate cancer Neg Hx      Allergies as of 11/04/2017   No Known Allergies     Medication List        Accurate as of 11/04/17 11:59 PM. Always use your most recent med list.          ALPRAZolam 0.25 MG tablet Commonly known as:  XANAX Take 1 tablet (0.25 mg total) by mouth every 8 (eight) hours as needed for anxiety.   irbesartan-hydrochlorothiazide 150-12.5 MG tablet Commonly known as:  AVALIDE Take 1 tablet by mouth daily.   pantoprazole 40 MG tablet Commonly known as:  PROTONIX Take 1 tablet (40 mg total) by mouth 2 (two) times daily before a meal.   pravastatin 10 MG tablet Commonly known as:  PRAVACHOL Take 1 tablet (10 mg total) by mouth daily.   tamsulosin 0.4 MG Caps capsule Commonly known as:  FLOMAX Take 1 capsule by mouth Daily.   timolol 0.5 % ophthalmic solution Commonly known as:  TIMOPTIC PLACE 1 DROP INTO BOTH EYES TWICE DAILY  Objective:   Physical Exam BP 118/78 (BP Location: Left Arm, Patient Position: Sitting, Cuff Size: Normal)   Pulse (!) 58   Temp 97.8 F (36.6 C) (Oral)   Resp 14   Ht 5\' 9"  (1.753 m)   Wt 220 lb 4 oz (99.9 kg)   SpO2 97%   BMI 32.53 kg/m  General:   Well developed, well nourished . NAD.  Neck: No  thyromegaly  HEENT:  Normocephalic . Face symmetric, atraumatic Lungs:  CTA B Normal respiratory effort, no intercostal retractions, no accessory muscle use. Heart: RRR,  no murmur.  No pretibial edema bilaterally  Abdomen:  Not distended, soft, non-tender. No rebound or rigidity.   Skin: Exposed areas without rash. Not pale. Not jaundice Neurologic:  alert & oriented X3.  Speech normal, gait appropriate for age and unassisted Strength symmetric and appropriate for age.  Psych: Cognition and judgment appear intact.  Cooperative with normal attention span and concentration.  Behavior appropriate. No anxious or depressed appearing.      Assessment &  Plan:   Assessment Prediabetes HTN Bradycardia: Holter 2016 essentially negative Hyperlipidemia Anxiety: on xanax, stress related to interstitial cystitis Insomnia: On melatonin, not interested in other medications DJD CTS: B, offered surgery before  Obesity: BMI 32 Glaucoma GU: Dr. Amalia Hailey --BPH, TURP --Interstitial cystitis Venous lake, right inferior clavicle  PLAN hyperglicemia: Check Q7M HTN: Seems well controlled, last BMP satisfactory, continue avalide Hyperlipidemia: On Pravachol, check a lipid panel, although he is not  fasting today. RTC 6 months

## 2017-11-04 NOTE — Assessment & Plan Note (Addendum)
-  Td :2017 ; pnm 23 2004,  booster 2016 @ [facility]; prevanr  2015; zostavax-- 2014 per pt ; had a flu shot ; reports had shingrix x 2  - CCS Last cscope 2012, several polyps, next  Was due  2017; will see GI soon, rec to d/w them appropriateness of cscope -Prostate ca screening-- per urology -Discussed diet-exercise  -labs: lipid panel,  A1c

## 2017-11-04 NOTE — Patient Instructions (Signed)
GO TO THE LAB : Get the blood work     GO TO THE FRONT DESK Schedule your next appointment for a checkup in 6 months   Check the  blood pressure 2 or 3 times a month  Be sure your blood pressure is between 110/65 and  135/85. If it is consistently higher or lower, let me know    

## 2017-11-04 NOTE — Progress Notes (Signed)
Pre visit review using our clinic review tool, if applicable. No additional management support is needed unless otherwise documented below in the visit note. 

## 2017-11-05 LAB — LIPID PANEL
CHOL/HDL RATIO: 3
Cholesterol: 136 mg/dL (ref 0–200)
HDL: 40.4 mg/dL (ref >39.00)
LDL CALC: 66 mg/dL (ref 0–99)
NonHDL: 96.04
TRIGLYCERIDES: 148 mg/dL (ref 0.0–149.0)
VLDL: 29.6 mg/dL (ref 0.0–40.0)

## 2017-11-05 LAB — HEMOGLOBIN A1C: Hgb A1c MFr Bld: 5.8 % (ref 4.6–6.5)

## 2017-11-05 NOTE — Assessment & Plan Note (Signed)
hyperglicemia: Check O9G HTN: Seems well controlled, last BMP satisfactory, continue avalide Hyperlipidemia: On Pravachol, check a lipid panel, although he is not  fasting today. RTC 6 months

## 2017-12-01 ENCOUNTER — Ambulatory Visit: Payer: Medicare Other | Admitting: Gastroenterology

## 2017-12-01 ENCOUNTER — Encounter: Payer: Self-pay | Admitting: Gastroenterology

## 2017-12-01 VITALS — BP 120/68 | HR 64 | Ht 70.0 in | Wt 222.4 lb

## 2017-12-01 DIAGNOSIS — R109 Unspecified abdominal pain: Secondary | ICD-10-CM

## 2017-12-01 DIAGNOSIS — K5904 Chronic idiopathic constipation: Secondary | ICD-10-CM

## 2017-12-01 DIAGNOSIS — Z86010 Personal history of colonic polyps: Secondary | ICD-10-CM | POA: Diagnosis not present

## 2017-12-01 DIAGNOSIS — Z800 Family history of malignant neoplasm of digestive organs: Secondary | ICD-10-CM

## 2017-12-01 MED ORDER — NA SULFATE-K SULFATE-MG SULF 17.5-3.13-1.6 GM/177ML PO SOLN: 1.0000 | Freq: Once | ORAL | 0 refills | Status: AC

## 2017-12-01 NOTE — Patient Instructions (Signed)
You have been scheduled for a colonoscopy. Please follow written instructions given to you at your visit today.  Please pick up your prep supplies at the pharmacy within the next 1-3 days. If you use inhalers (even only as needed), please bring them with you on the day of your procedure. Your physician has requested that you go to www.startemmi.com and enter the access code given to you at your visit today. This web site gives a general overview about your procedure. However, you should still follow specific instructions given to you by our office regarding your preparation for the procedure.  Normal BMI (Body Mass Index- based on height and weight) is between 23 and 30. Your BMI today is Body mass index is 31.91 kg/m. Marland Kitchen Please consider follow up  regarding your BMI with your Primary Care Provider.  Thank you for choosing me and [facility].  Pricilla Riffle. Dagoberto Ligas., MD., Marval Regal

## 2017-12-01 NOTE — Progress Notes (Signed)
History of Present Illness: This is an 84 year old male referred by Kathlene November, MD for the evaluation of a personal history of adenomatous colon polyps and family history of colon cancer.  He is accompanied by his wife.  He relates mild chronic constipation that is well controlled with the use of Metamucil daily.  He relates mild right sided abdominal pain that occurred following meals intermittently however these symptoms resolved several months ago.  See CT scan below. Denies weight loss, diarrhea, change in stool caliber, melena, hematochezia, nausea, vomiting, dysphagia, reflux symptoms, chest pain.  Abd/pelvic CT 04/2016 IMPRESSION: 1. No explanation for the patient's right upper quadrant pain is seen. 2. Stable 6 mm left lower lobe lung nodule compared to CT from 2006, consistent with a benign process. 3. The ureters are slightly prominent to the bladder. The bladder is decompressed and somewhat thick-walled. Cannot exclude urinary bladder edema or infiltration, although the bladder wall thickening may be due to lack of distension. Correlate clinically. 4. Bilateral scrotal hydroceles right larger than left. 5. Degenerative disc disease at L1-2, L2-3, and L4-5 levels.   Colonoscopy 2012: 2 tubular adenomas, sigmoid diverticulosis   No Known Allergies Outpatient Medications Prior to Visit  Medication Sig Dispense Refill  . ALPRAZolam (XANAX) 0.25 MG tablet Take 1 tablet (0.25 mg total) by mouth every 8 (eight) hours as needed for anxiety. 30 tablet 3  . irbesartan-hydrochlorothiazide (AVALIDE) 150-12.5 MG tablet Take 1 tablet by mouth daily. 90 tablet 1  . pantoprazole (PROTONIX) 40 MG tablet Take 1 tablet (40 mg total) by mouth 2 (two) times daily before a meal. 180 tablet 0  . pravastatin (PRAVACHOL) 10 MG tablet Take 1 tablet (10 mg total) by mouth daily. 90 tablet 1  . Tamsulosin HCl (FLOMAX) 0.4 MG CAPS Take 1 capsule by mouth Daily.    . timolol (TIMOPTIC) 0.5 % ophthalmic  solution PLACE 1 DROP INTO BOTH EYES TWICE DAILY 10 mL 5   No facility-administered medications prior to visit.    Past Medical History:  Diagnosis Date  . Adenomatous polyp of colon 10/2005  . Allergic rhinitis   . Anxiety   . BPH (benign prostatic hyperplasia)   . Chronic interstitial cystitis    Dr Amalia Hailey  . Chronic sinusitis   . CTS (carpal tunnel syndrome) 01/27/2017  . Glaucoma   . Hyperlipidemia   . Hypertension   . Osteoarthritis   . Venous lake    right inferior clavicle, shave   Past Surgical History:  Procedure Laterality Date  . APPENDECTOMY    . CATARACT EXTRACTION Right 06-2015  . POLYPECTOMY    . TRANSURETHRAL RESECTION OF PROSTATE     Social History   Socioeconomic History  . Marital status: Married    Spouse name: None  . Number of children: 1  . Years of education: None  . Highest education level: None  Social Needs  . Financial resource strain: None  . Food insecurity - worry: None  . Food insecurity - inability: None  . Transportation needs - medical: None  . Transportation needs - non-medical: None  Occupational History  . Occupation: retired- Company secretary: RETIRED  Tobacco Use  . Smoking status: Never Smoker  . Smokeless tobacco: Never Used  Substance and Sexual Activity  . Alcohol use: Yes    Alcohol/week: 1.8 oz    Types: 3 Glasses of wine per week  . Drug use: No  . Sexual activity: None  Other Topics  Concern  . None  Social History Narrative   Born in [place], raised in [place], moved to [place] in the 60s   Son lives in the [place]    Family History  Problem Relation Age of Onset  . Anxiety disorder Other   . Hyperlipidemia Father   . Hypertension Father   . CAD Father   . Colon cancer Neg Hx   . Prostate cancer Neg Hx       Review of Systems: Pertinent positive and negative review of systems were noted in the above HPI section. All other review of systems were otherwise negative.    Physical Exam: General: Well developed,  well nourished, no acute distress Head: Normocephalic and atraumatic Eyes:  sclerae anicteric, EOMI Ears: Normal auditory acuity Mouth: No deformity or lesions Neck: Supple, no masses or thyromegaly Lungs: Clear throughout to auscultation Heart: Regular rate and rhythm; no murmurs, rubs or bruits Abdomen: Soft, non tender and non distended. No masses, hepatosplenomegaly or hernias noted. Normal Bowel sounds Rectal: Deferred to colonoscopy Musculoskeletal: Symmetrical with no gross deformities  Skin: No lesions on visible extremities Pulses:  Normal pulses noted Extremities: No clubbing, cyanosis, edema or deformities noted Neurological: Alert oriented x 4, grossly nonfocal Cervical Nodes:  No significant cervical adenopathy Inguinal Nodes: No significant inguinal adenopathy Psychological:  Alert and cooperative. Normal mood and affect  Assessment and Recommendations:  1.  Personal history of adenomatous colon polyps, family history of colon cancer.  He is in very good health and given his personal and family history he is interested in proceeding with colonoscopy.  Schedule colonoscopy. The risks (including bleeding, perforation, infection, missed lesions, medication reactions and possible hospitalization or surgery if complications occur), benefits, and alternatives to colonoscopy with possible biopsy and possible polypectomy were discussed with the patient and they consent to proceed.   2. Mild constipation.. Continue Metamucil daily with adequate daily water intake.  3.  Right-sided abdominal pain that resolved several months ago.  If symptoms return he is advised to contact us to pursue further evaluation however given his recent unremarkable blood work and CT scan performed in 2017 we will not plan on further evaluation at this time.   cc: Kathlene November, MD

## 2017-12-22 ENCOUNTER — Encounter: Payer: Self-pay | Admitting: Gastroenterology

## 2017-12-24 ENCOUNTER — Other Ambulatory Visit: Payer: Self-pay | Admitting: Internal Medicine

## 2018-01-05 ENCOUNTER — Encounter: Payer: Self-pay | Admitting: Gastroenterology

## 2018-01-05 ENCOUNTER — Ambulatory Visit (AMBULATORY_SURGERY_CENTER): Payer: Medicare Other | Admitting: Gastroenterology

## 2018-01-05 ENCOUNTER — Other Ambulatory Visit: Payer: Self-pay

## 2018-01-05 VITALS — BP 136/73 | HR 43 | Temp 97.8°F | Resp 16 | Ht 70.0 in | Wt 222.0 lb

## 2018-01-05 DIAGNOSIS — D123 Benign neoplasm of transverse colon: Secondary | ICD-10-CM | POA: Diagnosis not present

## 2018-01-05 DIAGNOSIS — D120 Benign neoplasm of cecum: Secondary | ICD-10-CM | POA: Diagnosis not present

## 2018-01-05 DIAGNOSIS — D125 Benign neoplasm of sigmoid colon: Secondary | ICD-10-CM | POA: Diagnosis not present

## 2018-01-05 DIAGNOSIS — Z800 Family history of malignant neoplasm of digestive organs: Secondary | ICD-10-CM | POA: Diagnosis not present

## 2018-01-05 DIAGNOSIS — D124 Benign neoplasm of descending colon: Secondary | ICD-10-CM

## 2018-01-05 DIAGNOSIS — Z86010 Personal history of colonic polyps: Secondary | ICD-10-CM | POA: Diagnosis present

## 2018-01-05 DIAGNOSIS — K635 Polyp of colon: Secondary | ICD-10-CM

## 2018-01-05 MED ORDER — SODIUM CHLORIDE 0.9 % IV SOLN: 500.0000 mL | Freq: Once | INTRAVENOUS | Status: DC

## 2018-01-05 NOTE — Progress Notes (Signed)
Report to PACU, RN, vss, BBS= Clear.  

## 2018-01-05 NOTE — Patient Instructions (Signed)
YOU HAD AN ENDOSCOPIC PROCEDURE TODAY AT THE [facility]:   Refer to the procedure report that was given to you for any specific questions about what was found during the examination.  If the procedure report does not answer your questions, please call your gastroenterologist to clarify.  If you requested that your care partner not be given the details of your procedure findings, then the procedure report has been included in a sealed envelope for you to review at your convenience later.  YOU SHOULD EXPECT: Some feelings of bloating in the abdomen. Passage of more gas than usual.  Walking can help get rid of the air that was put into your GI tract during the procedure and reduce the bloating. If you had a lower endoscopy (such as a colonoscopy or flexible sigmoidoscopy) you may notice spotting of blood in your stool or on the toilet paper. If you underwent a bowel prep for your procedure, you may not have a normal bowel movement for a few days.  Please Note:  You might notice some irritation and congestion in your nose or some drainage.  This is from the oxygen used during your procedure.  There is no need for concern and it should clear up in a day or so.  SYMPTOMS TO REPORT IMMEDIATELY:   Following lower endoscopy (colonoscopy or flexible sigmoidoscopy):  Excessive amounts of blood in the stool  Significant tenderness or worsening of abdominal pains  Swelling of the abdomen that is new, acute  Fever of 100F or higher    For urgent or emergent issues, a gastroenterologist can be reached at any hour by calling [phone].   DIET:  We do recommend a small meal at first, but then you may proceed to your regular diet.  Drink plenty of fluids but you should avoid alcoholic beverages for 24 hours.  ACTIVITY:  You should plan to take it easy for the rest of today and you should NOT DRIVE or use heavy machinery until tomorrow (because of the sedation medicines used during the test).     FOLLOW UP: Our staff will call the number listed on your records the next business day following your procedure to check on you and address any questions or concerns that you may have regarding the information given to you following your procedure. If we do not reach you, we will leave a message.  However, if you are feeling well and you are not experiencing any problems, there is no need to return our call.  We will assume that you have returned to your regular daily activities without incident.  If any biopsies were taken you will be contacted by phone or by letter within the next 1-3 weeks.  Please call us at [phone] if you have not heard about the biopsies in 3 weeks.    SIGNATURES/CONFIDENTIALITY: You and/or your care partner have signed paperwork which will be entered into your electronic medical record.  These signatures attest to the fact that that the information above on your After Visit Summary has been reviewed and is understood.  Full responsibility of the confidentiality of this discharge information lies with you and/or your care-partner.   Resume medications. Information given on polyps and diverticulosis. 

## 2018-01-05 NOTE — Progress Notes (Signed)
Called to room to assist during endoscopic procedure.  Patient ID and intended procedure confirmed with present staff. Received instructions for my participation in the procedure from the performing physician.  

## 2018-01-05 NOTE — Op Note (Signed)
[facility] Patient Name: Robert Krause Procedure Date: 01/05/2018 1:30 PM MRN: [id number] Endoscopist: Ladene Artist , MD Age: 84 Referring MD:  Date of Birth: 1934/02/07 Gender: Male Account #: [id number] Procedure:                Colonoscopy Indications:              Surveillance: Personal history of adenomatous                            polyps on last colonoscopy > 5 years ago. Family                            history of colon cancer. Medicines:                Monitored Anesthesia Care Procedure:                Pre-Anesthesia Assessment:                           - Prior to the procedure, a History and Physical                            was performed, and patient medications and                            allergies were reviewed. The patient's tolerance of                            previous anesthesia was also reviewed. The risks                            and benefits of the procedure and the sedation                            options and risks were discussed with the patient.                            All questions were answered, and informed consent                            was obtained. Prior Anticoagulants: The patient has                            taken no previous anticoagulant or antiplatelet                            agents. ASA Grade Assessment: II - A patient with                            mild systemic disease. After reviewing the risks                            and benefits, the patient was deemed in  satisfactory condition to undergo the procedure.                           After obtaining informed consent, the colonoscope                            was passed under direct vision. Throughout the                            procedure, the patient's blood pressure, pulse, and                            oxygen saturations were monitored continuously. The                            Colonoscope was introduced through the  anus and                            advanced to the the cecum, identified by                            appendiceal orifice and ileocecal valve. The                            ileocecal valve, appendiceal orifice, and rectum                            were photographed. The quality of the bowel                            preparation was good. The colonoscopy was performed                            without difficulty. The patient tolerated the                            procedure well. Scope In: 1:33:05 PM Scope Out: 1:50:33 PM Scope Withdrawal Time: 0 hours 15 minutes 14 seconds  Total Procedure Duration: 0 hours 17 minutes 28 seconds  Findings:                 The perianal and digital rectal examinations were                            normal.                           A 5 mm polyp was found in the transverse colon. The                            polyp was sessile. The polyp was removed with a                            cold biopsy forceps. Resection and retrieval were  complete.                           Three sessile polyps were found in the sigmoid                            colon, descending colon and cecum. The polyps were                            6 to 8 mm in size. These polyps were removed with a                            cold snare. Resection and retrieval were complete.                           Multiple medium-mouthed diverticula were found in                            the left colon. There was no evidence of                            diverticular bleeding.                           The exam was otherwise without abnormality on                            direct and retroflexion views. Complications:            No immediate complications. Estimated blood loss:                            None. Estimated Blood Loss:     Estimated blood loss: none. Impression:               - One 5 mm polyp in the transverse colon, removed                             with a cold biopsy forceps. Resected and retrieved.                           - Three 6 to 8 mm polyps in the sigmoid colon, in                            the descending colon and in the cecum, removed with                            a cold snare. Resected and retrieved.                           - Mild diverticulosis in the left colon. There was                            no evidence of diverticular bleeding.                           -  The examination was otherwise normal on direct                            and retroflexion views. Recommendation:           - Patient has a contact number available for                            emergencies. The signs and symptoms of potential                            delayed complications were discussed with the                            patient. Return to normal activities tomorrow.                            Written discharge instructions were provided to the                            patient.                           - Resume previous diet.                           - Continue present medications.                           - Await pathology results.                           - No repeat colonoscopy due to age. Ladene Artist, MD 01/05/2018 1:56:17 PM This report has been signed electronically.

## 2018-01-05 NOTE — Progress Notes (Signed)
Pt's states no medical or surgical changes since previsit or office visit. 

## 2018-01-06 ENCOUNTER — Telehealth: Payer: Self-pay

## 2018-01-06 NOTE — Telephone Encounter (Signed)
  Follow up Call-  Call back number 01/05/2018  Post procedure Call Back phone  # [phone]  Permission to leave phone message Yes  Some recent data might be hidden     Patient questions:  Do you have a fever, pain , or abdominal swelling? No. Pain Score  0 *  Have you tolerated food without any problems? Yes.  Have you been able to return to your normal activities? Yes.  Do you have any questions about your discharge instructions: Diet   No. Medications  No. Follow up visit  No.  Do you have questions or concerns about your Care? No.  Actions: * If pain score is 4 or above: No action needed, pain <4.

## 2018-01-23 ENCOUNTER — Encounter: Payer: Self-pay | Admitting: Gastroenterology

## 2018-01-31 ENCOUNTER — Other Ambulatory Visit: Payer: Self-pay | Admitting: Internal Medicine

## 2018-02-02 DIAGNOSIS — G8929 Other chronic pain: Secondary | ICD-10-CM | POA: Diagnosis not present

## 2018-02-02 DIAGNOSIS — N411 Chronic prostatitis: Secondary | ICD-10-CM | POA: Diagnosis not present

## 2018-02-02 DIAGNOSIS — N3010 Interstitial cystitis (chronic) without hematuria: Secondary | ICD-10-CM | POA: Diagnosis not present

## 2018-02-02 DIAGNOSIS — R102 Pelvic and perineal pain: Secondary | ICD-10-CM | POA: Diagnosis not present

## 2018-02-23 NOTE — Progress Notes (Deleted)
Subjective:   Robert Krause is a 84 y.o. male who presents for Medicare Annual/Subsequent preventive examination.  Review of Systems: No ROS.  Medicare Wellness Visit. Additional risk factors are reflected in the social history.    Sleep patterns:  Home Safety/Smoke Alarms: Feels safe in home. Smoke alarms in place.  Living environment; residence and Firearm Safety:  [place] Safety/Bike Helmet: Wears seat belt.   Male:   CCS-  UTD   PSA-  Lab Results  Component Value Date   PSA 0.77 06/06/2013   PSA 0.62 10/30/2009   PSA 0.50 10/24/2008       Objective:    Vitals: There were no vitals taken for this visit.  There is no height or weight on file to calculate BMI.  No flowsheet data found.  Tobacco Social History   Tobacco Use  Smoking Status Never Smoker  Smokeless Tobacco Never Used     Counseling given: Not Answered   Clinical Intake:                       Past Medical History:  Diagnosis Date  . Adenomatous polyp of colon 10/2005  . Allergic rhinitis   . Anxiety   . BPH (benign prostatic hyperplasia)   . Chronic interstitial cystitis    Dr Amalia Hailey  . Chronic sinusitis   . CTS (carpal tunnel syndrome) 01/27/2017  . Glaucoma   . Hyperlipidemia   . Hypertension   . Osteoarthritis   . Venous lake    right inferior clavicle, shave   Past Surgical History:  Procedure Laterality Date  . APPENDECTOMY    . CATARACT EXTRACTION Right 06-2015  . POLYPECTOMY    . TRANSURETHRAL RESECTION OF PROSTATE     Family History  Problem Relation Age of Onset  . Anxiety disorder Other   . Hyperlipidemia Father   . Hypertension Father   . CAD Father   . Colon cancer Neg Hx   . Prostate cancer Neg Hx    Social History   Socioeconomic History  . Marital status: Married    Spouse name: Not on file  . Number of children: 1  . Years of education: Not on file  . Highest education level: Not on file  Occupational History  . Occupation: retiredArchitectural technologist: RETIRED  Social Needs  . Financial resource strain: Not on file  . Food insecurity:    Worry: Not on file    Inability: Not on file  . Transportation needs:    Medical: Not on file    Non-medical: Not on file  Tobacco Use  . Smoking status: Never Smoker  . Smokeless tobacco: Never Used  Substance and Sexual Activity  . Alcohol use: Yes    Alcohol/week: 1.8 oz    Types: 3 Glasses of wine per week  . Drug use: No  . Sexual activity: Not on file  Lifestyle  . Physical activity:    Days per week: Not on file    Minutes per session: Not on file  . Stress: Not on file  Relationships  . Social connections:    Talks on phone: Not on file    Gets together: Not on file    Attends religious service: Not on file    Active member of club or organization: Not on file    Attends meetings of clubs or organizations: Not on file    Relationship status: Not on file  Other  Topics Concern  . Not on file  Social History Narrative   Born in [place], raised in [place], moved to [place] in the 60s   Son lives in the [place]     Outpatient Encounter Medications as of 02/26/2018  Medication Sig  . ALPRAZolam (XANAX) 0.25 MG tablet Take 1 tablet (0.25 mg total) by mouth every 8 (eight) hours as needed for anxiety.  . irbesartan-hydrochlorothiazide (AVALIDE) 150-12.5 MG tablet Take 1 tablet by mouth daily.  . pantoprazole (PROTONIX) 40 MG tablet Take 1 tablet (40 mg total) by mouth 2 (two) times daily before a meal. (Patient not taking: Reported on 01/05/2018)  . pravastatin (PRAVACHOL) 10 MG tablet Take 1 tablet (10 mg total) by mouth daily.  . Tamsulosin HCl (FLOMAX) 0.4 MG CAPS Take 1 capsule by mouth Daily.  . timolol (TIMOPTIC) 0.5 % ophthalmic solution PLACE 1 DROP INTO BOTH EYES TWICE DAILY   No facility-administered encounter medications on file as of 02/26/2018.     Activities of Daily Living In your present state of health, do you have any difficulty performing the following  activities: 10/14/2017  Hearing? N  Vision? N  Difficulty concentrating or making decisions? N  Walking or climbing stairs? N  Dressing or bathing? N  Doing errands, shopping? N  Some recent data might be hidden    Patient Care Team: Colon Branch, MD as PCP - General (Internal Medicine) Domingo Pulse, MD (Urology) Harriett Sine, MD as Consulting Physician (Dermatology) Rutherford Guys, MD as Consulting Physician (Ophthalmology)   Assessment:   This is a routine wellness examination for Robert Krause. Physical assessment deferred to PCP.   Exercise Activities and Dietary recommendations   Diet (meal preparation, eat out, water intake, caffeinated beverages, dairy products, fruits and vegetables): {Desc; diets:16563} Breakfast: Lunch:  Dinner:      Goals    None      Fall Risk Fall Risk  10/14/2017 07/08/2016 07/24/2015 02/01/2015 06/06/2013  Falls in the past year? No No No No No    Depression Screen PHQ 2/9 Scores 10/14/2017 07/08/2016 07/24/2015 02/01/2015  PHQ - 2 Score 0 0 0 0    Cognitive Function        Immunization History  Administered Date(s) Administered  . Influenza Split 09/05/2011  . Influenza Whole 08/24/2008, 08/24/2009, 10/02/2010  . Influenza, Seasonal, Injecte, Preservative Fre 08/16/2013  . Influenza,inj,Quad PF,6+ Mos 07/24/2015  . Influenza-Unspecified 10/04/2016, 07/28/2017  . Pneumococcal Conjugate-13 07/18/2014, 03/29/2015  . Pneumococcal Polysaccharide-23 09/25/2003  . Td 09/24/2006, 07/08/2016  . Zoster 11/24/2012  . Zoster Recombinat (Shingrix) 03/03/2017, 06/08/2017    Screening Tests Health Maintenance  Topic Date Due  . INFLUENZA VACCINE  06/24/2018  . COLONOSCOPY  01/05/2023  . TETANUS/TDAP  07/08/2026  . PNA vac Low Risk Adult  Completed   Plan:   ***  I have personally reviewed and noted the following in the patient's chart:   . Medical and social history . Use of alcohol, tobacco or illicit drugs  . Current  medications and supplements . Functional ability and status . Nutritional status . Physical activity . Advanced directives . List of other physicians . Hospitalizations, surgeries, and ER visits in previous 12 months . Vitals . Screenings to include cognitive, depression, and falls . Referrals and appointments  In addition, I have reviewed and discussed with patient certain preventive protocols, quality metrics, and best practice recommendations. A written personalized care plan for preventive services as well as general preventive health recommendations were provided to patient.  Shela Nevin, [place]  02/23/2018

## 2018-02-26 ENCOUNTER — Ambulatory Visit: Payer: Medicare Other | Admitting: *Deleted

## 2018-04-14 DIAGNOSIS — G5603 Carpal tunnel syndrome, bilateral upper limbs: Secondary | ICD-10-CM | POA: Diagnosis not present

## 2018-04-15 ENCOUNTER — Encounter: Payer: Self-pay | Admitting: Family Medicine

## 2018-04-15 ENCOUNTER — Ambulatory Visit (INDEPENDENT_AMBULATORY_CARE_PROVIDER_SITE_OTHER): Payer: Medicare Other | Admitting: Family Medicine

## 2018-04-15 VITALS — BP 130/70 | HR 62 | Resp 16 | Ht 70.0 in | Wt 221.0 lb

## 2018-04-15 DIAGNOSIS — L242 Irritant contact dermatitis due to solvents: Secondary | ICD-10-CM | POA: Diagnosis not present

## 2018-04-15 NOTE — Patient Instructions (Signed)
It was good to see you today- I think that you have a contact allergy to gasoline on your arm Try an OTC hydrocortisone cream once or twice a day Let me know if your rash is not better in the next few days- sooner if any other concerns

## 2018-04-15 NOTE — Progress Notes (Signed)
[facility] [place], [place], [place] [address] [phone]  Date:  04/15/2018   Name:  Robert Krause   DOB:  10-13-1934   MRN:  [id number]  PCP:  Colon Branch, MD    Chief Complaint: Rash (noticied a couple of weeks ago, inside of right arm, possibly from gasoline )   History of Present Illness:  Robert Krause is a 84 y.o. very pleasant male patient who presents with the following:  Pt of D. Paz, here today with concern of a rash on his RIGHT arm History of hyperlipidemia, HTN  He first noticed the marks on his arm a couple of days ago It is itchy Does not hurt He got some gasoline on his arm while he was filling up his lawnmower earlier that same day- he forgot about the spill and did not rinse it off for some hours It is actually a bit better now  He is otherwise feeling well  He [place] originally from [place]   Patient Active Problem List   Diagnosis Date Noted  . CTS (carpal tunnel syndrome)-- B , was rec surgery before 01/27/2017  . Abdominal pain, right upper quadrant 04/24/2016  . PCP NOTES >>>>>> 11/08/2015  . Annual physical exam >>>>>>>>>>>>>>>>>>> 07/18/2014  . Chronic asthmatic bronchitis ([place]) 11/22/2012  . Colon polyp 04/14/2011  . 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 ACTINIC KERATOSIS, CHEEK, LEFT 08/28/2009  . PROSTATITIS, ACUTE, CHRONIC 03/26/2009  . FINGER SUP FB W/O MAJ OPEN WOUND&W/O MENTION INF 02/11/2008  . Hyperlipidemia 09/28/2007  . Essential hypertension 09/28/2007  . OTHER CHRONIC SINUSITIS 09/28/2007  . ALLERGIC RHINITIS 09/28/2007  . CYSTITIS, CHRONIC INTERSTITIAL -- xanax prn, Dr Amalia Hailey  09/28/2007  . OSTEOARTHRITIS 09/28/2007    Past Medical History:  Diagnosis Date  . Adenomatous polyp of colon 10/2005  . Allergic rhinitis   . Anxiety   . BPH (benign prostatic hyperplasia)   . Chronic interstitial cystitis    Dr Amalia Hailey  . Chronic sinusitis   .  CTS (carpal tunnel syndrome) 01/27/2017  . Glaucoma   . Hyperlipidemia   . Hypertension   . Osteoarthritis   . Venous lake    right inferior clavicle, shave    Past Surgical History:  Procedure Laterality Date  . APPENDECTOMY    . CATARACT EXTRACTION Right 06-2015  . POLYPECTOMY    . TRANSURETHRAL RESECTION OF PROSTATE      Social History   Tobacco Use  . Smoking status: Never Smoker  . Smokeless tobacco: Never Used  Substance Use Topics  . Alcohol use: Yes    Alcohol/week: 1.8 oz    Types: 3 Glasses of wine per week  . Drug use: No    Family History  Problem Relation Age of Onset  . Anxiety disorder Other   . Hyperlipidemia Father   . Hypertension Father   . CAD Father   . Colon cancer Neg Hx   . Prostate cancer Neg Hx     No Known Allergies  Medication list has been reviewed and updated.  Current Outpatient Medications on File Prior to Visit  Medication Sig Dispense Refill  . ALPRAZolam (XANAX) 0.25 MG tablet Take 1 tablet (0.25 mg total) by mouth every 8 (eight) hours as needed for anxiety. 30 tablet 3  . irbesartan-hydrochlorothiazide (AVALIDE) 150-12.5 MG tablet Take 1 tablet by mouth daily. 90 tablet 1  . pantoprazole (PROTONIX) 40 MG tablet  Take 1 tablet (40 mg total) by mouth 2 (two) times daily before a meal. 180 tablet 0  . pravastatin (PRAVACHOL) 10 MG tablet Take 1 tablet (10 mg total) by mouth daily. 90 tablet 1  . Tamsulosin HCl (FLOMAX) 0.4 MG CAPS Take 1 capsule by mouth Daily.    . timolol (TIMOPTIC) 0.5 % ophthalmic solution PLACE 1 DROP INTO BOTH EYES TWICE DAILY 10 mL 5   No current facility-administered medications on file prior to visit.     Review of Systems:  As per HPI- otherwise negative.   Physical Examination: Vitals:   04/15/18 1649  BP: 130/70  Pulse: 62  Resp: 16  SpO2: 98%   Vitals:   04/15/18 1649  Weight: 221 lb (100.2 kg)  Height: 5\' 10"  (1.778 m)   Body mass index is 31.71 kg/m. Ideal Body Weight: Weight in  (lb) to have BMI = 25: 173.9  GEN: WDWN, NAD, Non-toxic, A & O x 3 HEENT: Atraumatic, Normocephalic. Neck supple. No masses, No LAD. Ears and Nose: No external deformity. CV: RRR, No M/G/R. No JVD. No thrill. No extra heart sounds. PULM: CTA B, no wheezes, crackles, rhonchi. No retractions. No resp. distress. No accessory muscle use. EXTR: No c/c/e NEURO Normal gait.  PSYCH: Normally interactive. Conversant. Not depressed or anxious appearing.  Calm demeanor.  Looks well, overweight Likely contact derm on his right inner forearm, slightly bumpy    Assessment and Plan: Irritant contact dermatitis due to solvent  Dermatitis from contact with solvent Getting better already, mild He will use OTC cortisone as needed Follow- up, or call me if not continuing to resolve  Signed Lamar Blinks, MD

## 2018-05-12 ENCOUNTER — Ambulatory Visit (INDEPENDENT_AMBULATORY_CARE_PROVIDER_SITE_OTHER): Payer: Medicare Other | Admitting: Internal Medicine

## 2018-05-12 ENCOUNTER — Encounter: Payer: Self-pay | Admitting: Internal Medicine

## 2018-05-12 VITALS — BP 136/74 | HR 54 | Temp 97.8°F | Resp 16 | Ht 70.0 in | Wt 216.4 lb

## 2018-05-12 DIAGNOSIS — B349 Viral infection, unspecified: Secondary | ICD-10-CM | POA: Diagnosis not present

## 2018-05-12 DIAGNOSIS — J029 Acute pharyngitis, unspecified: Secondary | ICD-10-CM

## 2018-05-12 LAB — POCT RAPID STREP A (OFFICE): RAPID STREP A SCREEN: NEGATIVE

## 2018-05-12 NOTE — Patient Instructions (Addendum)
Rest, fluids , tylenol  For cough:  Take Mucinex DM twice a day as needed until better  For nasal congestion: Use OTC Nasocort or Flonase : 2 nasal sprays on each side of the nose in the morning until you feel better   Avoid decongestants such as  Pseudoephedrine or phenylephrine     Call if not gradually better over the next  10 days  Call anytime if the symptoms are severe   ==== Also schedule a follow-up at your convenience

## 2018-05-12 NOTE — Progress Notes (Signed)
Subjective:    Patient ID: Robert Krause, male    DOB: Jul 02, 1934, 84 y.o.   MRN: [id number]  DOS:  05/12/2018 Type of visit - description : Acute visit  interval history: Symptoms started 3 days ago: Sore throat, headache mostly frontal.  Has not required any medication.  No sick contacts to his knowledge.   Review of Systems Denies fevers, + chills. Admits to sinus congestion, nasal discharge and  some postnasal dripping. Although he has mild chest congestion he has minimal cough. Admits to myalgias. No chest pain, difficulty breathing No nausea, vomiting, diarrhea  Past Medical History:  Diagnosis Date  . Adenomatous polyp of colon 10/2005  . Allergic rhinitis   . Anxiety   . BPH (benign prostatic hyperplasia)   . Chronic interstitial cystitis    Dr Amalia Hailey  . Chronic sinusitis   . CTS (carpal tunnel syndrome) 01/27/2017  . Glaucoma   . Hyperlipidemia   . Hypertension   . Osteoarthritis   . Venous lake    right inferior clavicle, shave    Past Surgical History:  Procedure Laterality Date  . APPENDECTOMY    . CATARACT EXTRACTION Right 06-2015  . POLYPECTOMY    . TRANSURETHRAL RESECTION OF PROSTATE      Social History   Socioeconomic History  . Marital status: Married    Spouse name: Not on file  . Number of children: 1  . Years of education: Not on file  . Highest education level: Not on file  Occupational History  . Occupation: retiredCorporate treasurer: RETIRED  Social Needs  . Financial resource strain: Not on file  . Food insecurity:    Worry: Not on file    Inability: Not on file  . Transportation needs:    Medical: Not on file    Non-medical: Not on file  Tobacco Use  . Smoking status: Never Smoker  . Smokeless tobacco: Never Used  Substance and Sexual Activity  . Alcohol use: Yes    Alcohol/week: 1.8 oz    Types: 3 Glasses of wine per week  . Drug use: No  . Sexual activity: Not on file  Lifestyle  . Physical activity:    Days per  week: Not on file    Minutes per session: Not on file  . Stress: Not on file  Relationships  . Social connections:    Talks on phone: Not on file    Gets together: Not on file    Attends religious service: Not on file    Active member of club or organization: Not on file    Attends meetings of clubs or organizations: Not on file    Relationship status: Not on file  . Intimate partner violence:    Fear of current or ex partner: Not on file    Emotionally abused: Not on file    Physically abused: Not on file    Forced sexual activity: Not on file  Other Topics Concern  . Not on file  Social History Narrative   Born in [place], raised in [place], moved to [place] in the 60s   Son lives in the [place] as of 05/12/2018   No Known Allergies     Medication List        Accurate as of 05/12/18 11:59 PM. Always use your most recent med list.          ALPRAZolam 0.25 MG tablet Commonly known as:  XANAX Take 1 tablet (0.25 mg total) by mouth every 8 (eight) hours as needed for anxiety.   irbesartan-hydrochlorothiazide 150-12.5 MG tablet Commonly known as:  AVALIDE Take 1 tablet by mouth daily.   pantoprazole 40 MG tablet Commonly known as:  PROTONIX Take 1 tablet (40 mg total) by mouth 2 (two) times daily before a meal.   pravastatin 10 MG tablet Commonly known as:  PRAVACHOL Take 1 tablet (10 mg total) by mouth daily.   tamsulosin 0.4 MG Caps capsule Commonly known as:  FLOMAX Take 1 capsule by mouth Daily.   timolol 0.5 % ophthalmic solution Commonly known as:  TIMOPTIC PLACE 1 DROP INTO BOTH EYES TWICE DAILY          Objective:   Physical Exam BP 136/74 (BP Location: Left Arm, Patient Position: Sitting, Cuff Size: Small)   Pulse (!) 54   Temp 97.8 F (36.6 C) (Oral)   Resp 16   Ht 5\' 10"  (1.778 m)   Wt 216 lb 6 oz (98.1 kg)   SpO2 96%   BMI 31.05 kg/m  General:   Well developed, NAD, see BMI.  HEENT:  Normocephalic . Face symmetric, atraumatic.   TMs obscured by wax.  Throat symmetric, slightly red, no white discharge. Nose: Congested.  Sinuses no TTP Lungs:  CTA B Normal respiratory effort, no intercostal retractions, no accessory muscle use. Heart: RRR,  no murmur.  No pretibial edema bilaterally  Skin: Not pale. Not jaundice Neurologic:  alert & oriented X3.  Speech normal, gait appropriate for age and unassisted Psych--  Cognition and judgment appear intact.  Cooperative with normal attention span and concentration.  Behavior appropriate. No anxious or depressed appearing.      Assessment & Plan:   Assessment Prediabetes HTN Bradycardia: Holter 2016 essentially negative Hyperlipidemia Anxiety: on xanax, stress related to interstitial cystitis Insomnia: On melatonin, not interested in other medications DJD CTS: B, offered surgery before  Obesity: BMI 32 Glaucoma GU: Dr. Amalia Hailey --BPH, TURP --Interstitial cystitis Venous lake, right inferior clavicle  PLAN Viral syndrome: Strep test negative, likely a viral syndrome, he does not look toxic, has not taken any medication for his sxs.  Recommend supportive treatment, see AVS, call if not better Due for a f/u , pt aware

## 2018-05-12 NOTE — Progress Notes (Signed)
Pre visit review using our clinic review tool, if applicable. No additional management support is needed unless otherwise documented below in the visit note. 

## 2018-05-13 NOTE — Assessment & Plan Note (Signed)
Viral syndrome: Strep test negative, likely a viral syndrome, he does not look toxic, has not taken any medication for his sxs.  Recommend supportive treatment, see AVS, call if not better Due for a f/u , pt aware

## 2018-05-14 ENCOUNTER — Other Ambulatory Visit: Payer: Self-pay | Admitting: Internal Medicine

## 2018-05-19 ENCOUNTER — Encounter: Payer: Medicare Other | Admitting: Neurology

## 2018-06-27 ENCOUNTER — Other Ambulatory Visit: Payer: Self-pay | Admitting: Internal Medicine

## 2018-06-28 ENCOUNTER — Encounter: Payer: Medicare Other | Admitting: Neurology

## 2018-08-05 DIAGNOSIS — N3010 Interstitial cystitis (chronic) without hematuria: Secondary | ICD-10-CM | POA: Diagnosis not present

## 2018-10-04 ENCOUNTER — Encounter: Payer: Self-pay | Admitting: Gastroenterology

## 2018-10-04 ENCOUNTER — Ambulatory Visit: Payer: Medicare Other | Admitting: Gastroenterology

## 2018-10-04 ENCOUNTER — Other Ambulatory Visit (INDEPENDENT_AMBULATORY_CARE_PROVIDER_SITE_OTHER): Payer: Medicare Other

## 2018-10-04 ENCOUNTER — Other Ambulatory Visit: Payer: Self-pay

## 2018-10-04 VITALS — BP 126/72 | HR 76 | Ht 69.0 in | Wt 216.1 lb

## 2018-10-04 DIAGNOSIS — R1031 Right lower quadrant pain: Secondary | ICD-10-CM | POA: Diagnosis not present

## 2018-10-04 DIAGNOSIS — E876 Hypokalemia: Secondary | ICD-10-CM

## 2018-10-04 DIAGNOSIS — R110 Nausea: Secondary | ICD-10-CM

## 2018-10-04 DIAGNOSIS — R7309 Other abnormal glucose: Secondary | ICD-10-CM

## 2018-10-04 LAB — CBC WITH DIFFERENTIAL/PLATELET
Basophils Absolute: 0.1 10*3/uL (ref 0.0–0.1)
Basophils Relative: 2 % (ref 0.0–3.0)
EOS PCT: 5.5 % — AB (ref 0.0–5.0)
Eosinophils Absolute: 0.4 10*3/uL (ref 0.0–0.7)
HCT: 43.7 % (ref 39.0–52.0)
Hemoglobin: 15.2 g/dL (ref 13.0–17.0)
LYMPHS ABS: 1.6 10*3/uL (ref 0.7–4.0)
Lymphocytes Relative: 23 % (ref 12.0–46.0)
MCHC: 34.9 g/dL (ref 30.0–36.0)
MCV: 92 fl (ref 78.0–100.0)
MONOS PCT: 8.7 % (ref 3.0–12.0)
Monocytes Absolute: 0.6 10*3/uL (ref 0.1–1.0)
NEUTROS ABS: 4.2 10*3/uL (ref 1.4–7.7)
NEUTROS PCT: 60.8 % (ref 43.0–77.0)
PLATELETS: 220 10*3/uL (ref 150.0–400.0)
RBC: 4.74 Mil/uL (ref 4.22–5.81)
RDW: 12.8 % (ref 11.5–15.5)
WBC: 6.9 10*3/uL (ref 4.0–10.5)

## 2018-10-04 LAB — HEPATIC FUNCTION PANEL
ALBUMIN: 3.7 g/dL (ref 3.5–5.2)
ALT: 16 U/L (ref 0–53)
AST: 16 U/L (ref 0–37)
Alkaline Phosphatase: 61 U/L (ref 39–117)
Bilirubin, Direct: 0.2 mg/dL (ref 0.0–0.3)
Total Bilirubin: 0.8 mg/dL (ref 0.2–1.2)
Total Protein: 7 g/dL (ref 6.0–8.3)

## 2018-10-04 LAB — BASIC METABOLIC PANEL
BUN: 16 mg/dL (ref 6–23)
CALCIUM: 9 mg/dL (ref 8.4–10.5)
CHLORIDE: 103 meq/L (ref 96–112)
CO2: 29 meq/L (ref 19–32)
Creatinine, Ser: 1.07 mg/dL (ref 0.40–1.50)
GFR: 70.33 mL/min (ref >60.00)
Glucose, Bld: 128 mg/dL — ABNORMAL HIGH (ref 70–99)
Potassium: 3.3 mEq/L — ABNORMAL LOW (ref 3.5–5.1)
SODIUM: 139 meq/L (ref 135–145)

## 2018-10-04 LAB — LIPASE: LIPASE: 1 U/L — AB (ref 11.0–59.0)

## 2018-10-04 LAB — TSH: TSH: 2.38 u[IU]/mL (ref 0.35–4.50)

## 2018-10-04 MED ORDER — ONDANSETRON HCL 4 MG PO TABS: 4.0000 mg | ORAL_TABLET | Freq: Three times a day (TID) | ORAL | 1 refills | Status: DC | PRN

## 2018-10-04 MED ORDER — PANTOPRAZOLE SODIUM 40 MG PO TBEC: 40.0000 mg | DELAYED_RELEASE_TABLET | Freq: Two times a day (BID) | ORAL | 11 refills | Status: DC

## 2018-10-04 NOTE — Progress Notes (Signed)
    History of Present Illness: This is an 84 year old male with frequent nausea and mild RLQ pain.  For the past 2 to 3 weeks he has noted ongoing nausea which worsens with after meals.  He notes regurgitation.  Denies vomiting.  He tried pantoprazole for 2 days without improvement in symptoms so he discontinued it.  He relates no diet or medication changes.  He has very mild intermittent right lower quadrant pain that does not appear to be associated with meals or bowel movements.  It sometimes occurs with movement.  No other gastrointestinal complaints.  Denies weight loss, constipation, diarrhea, change in stool caliber, melena, hematochezia, vomiting, dysphagia, chest pain.   Current Medications, Allergies, Past Medical History, Past Surgical History, Family History and Social History were reviewed in [employer] record.  Physical Exam: General: Well developed, well nourished, no acute distress Head: Normocephalic and atraumatic Eyes:  sclerae anicteric, EOMI Ears: Normal auditory acuity Mouth: No deformity or lesions Lungs: Clear throughout to auscultation Heart: Regular rate and rhythm; no murmurs, rubs or bruits Abdomen: Soft, minimal right lower quadrant tenderness to deep palpation and non distended. No masses, hepatosplenomegaly or hernias noted. Normal Bowel sounds Rectal: Not done Musculoskeletal: Symmetrical with no gross deformities  Pulses:  Normal pulses noted Extremities: No clubbing, cyanosis, edema or deformities noted Neurological: Alert oriented x 4, grossly nonfocal Psychological:  Alert and cooperative. Normal mood and affect   Assessment and Recommendations:  1.  Nausea.  Rule out GERD, gastritis, ulcer.  Less likely Flomax or Pravachol side effects. Pantoprazole 40 mg p.o. twice daily for the next 6 weeks.  CBC, CMP, TSH, lipase today.  Consider EGD and abdominal imaging if symptoms persist.  REV in 4 weeks.   2. Mild RLQ pain.  Suspected  musculoskeletal symptoms.  Assess blood work and response to PPI.  If symptoms persist consider abdominal pelvic CT scan. REV in 4 weeks.

## 2018-10-04 NOTE — Patient Instructions (Signed)
Your provider has requested that you go to the basement level for lab work before leaving today. Press "B" on the elevator. The lab is located at the first door on the left as you exit the elevator.  We have sent the following medications to your pharmacy for you to pick up at your convenience: pantoprazole and zofran.   Normal BMI (Body Mass Index- based on height and weight) is between 23 and 30. Your BMI today is Body mass index is 32.86 kg/m. Marland Kitchen Please consider follow up  regarding your BMI with your Primary Care Provider.  Thank you for choosing me and [facility].  Pricilla Riffle. Dagoberto Ligas., MD., Mar[facility] Regal

## 2018-10-15 ENCOUNTER — Other Ambulatory Visit (INDEPENDENT_AMBULATORY_CARE_PROVIDER_SITE_OTHER): Payer: Medicare Other

## 2018-10-15 DIAGNOSIS — R7309 Other abnormal glucose: Secondary | ICD-10-CM

## 2018-10-15 DIAGNOSIS — E876 Hypokalemia: Secondary | ICD-10-CM

## 2018-10-15 LAB — BASIC METABOLIC PANEL
BUN: 17 mg/dL (ref 6–23)
CALCIUM: 8.7 mg/dL (ref 8.4–10.5)
CO2: 30 meq/L (ref 19–32)
Chloride: 103 mEq/L (ref 96–112)
Creatinine, Ser: 1.07 mg/dL (ref 0.40–1.50)
GFR: 70.33 mL/min (ref >60.00)
GLUCOSE: 98 mg/dL (ref 70–99)
Potassium: 4 mEq/L (ref 3.5–5.1)
SODIUM: 137 meq/L (ref 135–145)

## 2018-10-27 ENCOUNTER — Ambulatory Visit (INDEPENDENT_AMBULATORY_CARE_PROVIDER_SITE_OTHER): Payer: Medicare Other | Admitting: Orthopaedic Surgery

## 2018-10-27 ENCOUNTER — Encounter (INDEPENDENT_AMBULATORY_CARE_PROVIDER_SITE_OTHER): Payer: Self-pay | Admitting: Orthopaedic Surgery

## 2018-10-27 ENCOUNTER — Other Ambulatory Visit (INDEPENDENT_AMBULATORY_CARE_PROVIDER_SITE_OTHER): Payer: Self-pay

## 2018-10-27 DIAGNOSIS — R200 Anesthesia of skin: Secondary | ICD-10-CM

## 2018-10-27 DIAGNOSIS — M79641 Pain in right hand: Secondary | ICD-10-CM

## 2018-10-27 DIAGNOSIS — M79642 Pain in left hand: Principal | ICD-10-CM

## 2018-10-27 NOTE — Progress Notes (Signed)
Office Visit Note   Patient: Robert Krause           Date of Birth: [birth year]           MRN: [id number] Visit Date: 10/27/2018              Requested by: Colon Branch, [address] [place], [place] [address] PCP: Colon Branch, MD   Assessment & Plan: Visit Diagnoses:  1. Bilateral hand numbness     Plan: Due to the fact the patient has chronic numbness tingling both hands EMG/ Nerve conduction studies are warranted to evaluate for carpal tunnel syndrome bilaterally.  Have him undergo these EMG nerve conduction studies follow-up after the studies to discuss further treatment.  Questions are encouraged and answered by Dr. Ninfa Linden and myself today.  Follow-Up Instructions: Return for After EMG/ NCS.   Orders:  No orders of the defined types were placed in this encounter.  No orders of the defined types were placed in this encounter.     Procedures: No procedures performed   Clinical Data: No additional findings.   Subjective: Chief Complaint  Patient presents with  . Left Hand - Pain  . Right Hand - Pain    HPI Mr. Robert Krause similar seen for the first time for bilateral hand numbness tingling.  He states he had no injury to either hand.  Reports years ago he was seen by an orthopedist and given braces which did not help for both hands.  Numbness in both hands awaken him.  Left hand is more bothersome than the right.  He denies any radicular symptoms down right arm.  Denies any neck pain.  Is nondiabetic.  Has had no particular injury to either hand.  Said no previous work-up of the numbness and tingling in both hands.  Review of Systems See HPI  Objective: Vital Signs: There were no vitals taken for this visit.  Physical Exam  Constitutional: He is oriented to person, place, and time. He appears well-developed and well-nourished. No distress.  Pulmonary/Chest: Effort normal.  Neurological: He is alert and oriented to person, place, and time.    Skin: He is not diaphoretic.  Psychiatric: He has a normal mood and affect.    Ortho Exam Thenar atrophy right hand.  Positive Tinel's bilaterally.  Negative compression over the median nerve bilaterally Phalen's negative bilaterally.  Sensation grossly intact bilateral hands throughout.  No rashes skin lesions ulcerations.  Radial pulses are 2+ bilaterally and equal symmetric. Specialty Comments:  No specialty comments available.  Imaging: No results found.   PMFS History: Patient Active Problem List   Diagnosis Date Noted  . CTS (carpal tunnel syndrome)-- B , was rec surgery before 01/27/2017  . Abdominal pain, right upper quadrant 04/24/2016  . PCP NOTES >>>>>> 11/08/2015  . Annual physical exam >>>>>>>>>>>>>>>>>>> 07/18/2014  . Chronic asthmatic bronchitis ([place]) 11/22/2012  . Colon polyp 04/14/2011  . 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 ACTINIC KERATOSIS, CHEEK, LEFT 08/28/2009  . PROSTATITIS, ACUTE, CHRONIC 03/26/2009  . FINGER SUP FB W/O MAJ OPEN WOUND&W/O MENTION INF 02/11/2008  . Hyperlipidemia 09/28/2007  . Essential hypertension 09/28/2007  . OTHER CHRONIC SINUSITIS 09/28/2007  . ALLERGIC RHINITIS 09/28/2007  . CYSTITIS, CHRONIC INTERSTITIAL -- xanax prn, Dr Amalia Hailey  09/28/2007  . OSTEOARTHRITIS 09/28/2007   Past Medical History:  Diagnosis Date  . Adenomatous polyp of colon 10/2005  . Allergic rhinitis   . Anxiety   . BPH (  benign prostatic hyperplasia)   . Chronic interstitial cystitis    Dr Amalia Hailey  . Chronic sinusitis   . CTS (carpal tunnel syndrome) 01/27/2017  . Glaucoma   . Hyperlipidemia   . Hypertension   . Osteoarthritis   . [place]    right inferior clavicle, shave    Family History  Problem Relation Age of Onset  . Anxiety disorder Other   . Hyperlipidemia Father   . Hypertension Father   . CAD Father   . Colon cancer Neg Hx   . Prostate cancer Neg Hx     Past Surgical History:  Procedure Laterality Date  . APPENDECTOMY    .  CATARACT EXTRACTION Right 06-2015  . POLYPECTOMY    . TRANSURETHRAL RESECTION OF PROSTATE     Social History   Occupational History  . Occupation: retired- Company secretary: RETIRED  Tobacco Use  . Smoking status: Never Smoker  . Smokeless tobacco: Never Used  Substance and Sexual Activity  . Alcohol use: Yes    Alcohol/week: 3.0 standard drinks    Types: 3 Glasses of wine per week  . Drug use: No  . Sexual activity: Not on file

## 2018-11-03 ENCOUNTER — Ambulatory Visit: Payer: Medicare Other | Admitting: Gastroenterology

## 2018-11-07 ENCOUNTER — Other Ambulatory Visit: Payer: Self-pay | Admitting: Internal Medicine

## 2018-11-08 ENCOUNTER — Ambulatory Visit: Payer: Medicare Other | Admitting: Gastroenterology

## 2018-11-08 ENCOUNTER — Ambulatory Visit (INDEPENDENT_AMBULATORY_CARE_PROVIDER_SITE_OTHER): Payer: Medicare Other | Admitting: Internal Medicine

## 2018-11-08 ENCOUNTER — Encounter: Payer: Self-pay | Admitting: Internal Medicine

## 2018-11-08 ENCOUNTER — Encounter: Payer: Self-pay | Admitting: Gastroenterology

## 2018-11-08 VITALS — BP 118/78 | HR 57 | Temp 97.9°F | Resp 16 | Ht 69.0 in | Wt 219.1 lb

## 2018-11-08 VITALS — BP 112/60 | HR 66 | Ht 69.0 in | Wt 217.2 lb

## 2018-11-08 DIAGNOSIS — Z0000 Encounter for general adult medical examination without abnormal findings: Secondary | ICD-10-CM

## 2018-11-08 DIAGNOSIS — R110 Nausea: Secondary | ICD-10-CM

## 2018-11-08 DIAGNOSIS — R739 Hyperglycemia, unspecified: Secondary | ICD-10-CM

## 2018-11-08 DIAGNOSIS — R1031 Right lower quadrant pain: Secondary | ICD-10-CM | POA: Diagnosis not present

## 2018-11-08 DIAGNOSIS — E785 Hyperlipidemia, unspecified: Secondary | ICD-10-CM | POA: Diagnosis not present

## 2018-11-08 DIAGNOSIS — I10 Essential (primary) hypertension: Secondary | ICD-10-CM | POA: Diagnosis not present

## 2018-11-08 LAB — LIPID PANEL
CHOL/HDL RATIO: 3
CHOLESTEROL: 141 mg/dL (ref 0–200)
HDL: 44 mg/dL (ref >39.00)
LDL CALC: 84 mg/dL (ref 0–99)
NonHDL: 97.41
Triglycerides: 69 mg/dL (ref 0.0–149.0)
VLDL: 13.8 mg/dL (ref 0.0–40.0)

## 2018-11-08 LAB — HEMOGLOBIN A1C: HEMOGLOBIN A1C: 5.6 % (ref 4.6–6.5)

## 2018-11-08 NOTE — Patient Instructions (Signed)
You have been scheduled for an endoscopy. Please follow written instructions given to you at your visit today. If you use inhalers (even only as needed), please bring them with you on the day of your procedure. Your physician has requested that you go to www.startemmi.com and enter the access code given to you at your visit today. This web site gives a general overview about your procedure. However, you should still follow specific instructions given to you by our office regarding your preparation for the procedure.  Thank you for choosing me and [facility].  Malcolm T. Stark, Jr., MD., FACG  

## 2018-11-08 NOTE — Progress Notes (Signed)
Pre visit review using our clinic review tool, if applicable. No additional management support is needed unless otherwise documented below in the visit note. 

## 2018-11-08 NOTE — Patient Instructions (Addendum)
Please schedule Medicare Wellness with Glenard Haring.   GO TO THE LAB : Get the blood work     GO TO THE FRONT DESK Schedule your next appointment for a  Physical exam in 1 year    Check the  blood pressure 2 or 3 times a  week   Be sure your blood pressure is between 110/65 and  135/85. If it is consistently higher or lower, let me know  Take Mucinex  twice a day as needed    For nasal congestion: Use OTC   Flonase : 2 nasal sprays on each side of the nose in the morning until you feel better

## 2018-11-08 NOTE — Progress Notes (Signed)
    History of Present Illness: This is an 84 year old male with nausea following meals. RLQ pain has resolved. Nausea improved but persist.  No other gastrointestinal complaints.  Low potassium corrected with dietary changes.    Current Medications, Allergies, Past Medical History, Past Surgical History, Family History and Social History were reviewed in [employer] record.  Physical Exam: General: Well developed, well nourished, no acute distress Head: Normocephalic and atraumatic Eyes:  sclerae anicteric, EOMI Ears: Normal auditory acuity Mouth: No deformity or lesions Lungs: Clear throughout to auscultation Heart: Regular rate and rhythm; no murmurs, rubs or bruits Abdomen: Soft, non tender and non distended. No masses, hepatosplenomegaly or hernias noted. Normal Bowel sounds Rectal: Not done Musculoskeletal: Symmetrical with no gross deformities  Pulses:  Normal pulses noted Extremities: No clubbing, cyanosis, edema or deformities noted Neurological: Alert oriented x 4, grossly nonfocal Psychological:  Alert and cooperative. Normal mood and affect   Assessment and Recommendations:  1.  Postprandial nausea, improved but not resolved.  Pantoprazole 40 mg twice daily and Zofran prn are helping symptoms but they have not resolved.  Rule out GERD, ulcer and other disorders.  Schedule EGD. The risks (including bleeding, perforation, infection, missed lesions, medication reactions and possible hospitalization or surgery if complications occur), benefits, and alternatives to endoscopy with possible biopsy and possible dilation were discussed with the patient and they consent to proceed.  If EGD is unremarkable and symptoms persist will proceed with abdominal/pelvic CT scan.  2.  Mild right lower quadrant pain, suspected musculoskeletal etiology.  Symptoms currently inactive.

## 2018-11-08 NOTE — Assessment & Plan Note (Addendum)
Prediabetes: Check A1c HTN: Seems controlled on Avalide.  Last BMP satisfactory.  Recommend to monitor BPs at home Hyperlipidemia: On Pravachol, last LFTs normal, check a FLP Recently seen by GI with nausea, doing better.  Has follow-up today RTC 1 year

## 2018-11-08 NOTE — Progress Notes (Signed)
Subjective:    Patient ID: Robert Krause, male    DOB: [birth year], 84 y.o.   MRN: [id number]  DOS:  11/08/2018 Type of visit - description : cpx  feeling well, no major concerns  Review of Systems Has episodic bladder symptoms, at baseline Has seasonal nasal congestion, usually in the fall.  Denies fever, chills, cough or chest congestion.  Other than above, a 14 point review of systems is negative    Past Medical History:  Diagnosis Date  . Adenomatous polyp of colon 10/2005  . Allergic rhinitis   . Anxiety   . BPH (benign prostatic hyperplasia)   . Chronic interstitial cystitis    Dr Amalia Hailey  . Chronic sinusitis   . CTS (carpal tunnel syndrome) 01/27/2017  . Glaucoma   . Hyperlipidemia   . Hypertension   . Osteoarthritis   . Venous lake    right inferior clavicle, shave    Past Surgical History:  Procedure Laterality Date  . APPENDECTOMY    . CATARACT EXTRACTION Right 06-2015  . POLYPECTOMY    . TRANSURETHRAL RESECTION OF PROSTATE      Social History   Socioeconomic History  . Marital status: Married    Spouse name: Not on file  . Number of children: 1  . Years of education: Not on file  . Highest education level: Not on file  Occupational History  . Occupation: retiredCorporate treasurer: RETIRED  Social Needs  . Financial resource strain: Not on file  . Food insecurity:    Worry: Not on file    Inability: Not on file  . Transportation needs:    Medical: Not on file    Non-medical: Not on file  Tobacco Use  . Smoking status: Never Smoker  . Smokeless tobacco: Never Used  Substance and Sexual Activity  . Alcohol use: Yes    Alcohol/week: 3.0 standard drinks    Types: 3 Glasses of wine per week  . Drug use: No  . Sexual activity: Not on file  Lifestyle  . Physical activity:    Days per week: Not on file    Minutes per session: Not on file  . Stress: Not on file  Relationships  . Social connections:    Talks on phone: Not on file    Gets  together: Not on file    Attends religious service: Not on file    Active member of club or organization: Not on file    Attends meetings of clubs or organizations: Not on file    Relationship status: Not on file  . Intimate partner violence:    Fear of current or ex partner: Not on file    Emotionally abused: Not on file    Physically abused: Not on file    Forced sexual activity: Not on file  Other Topics Concern  . Not on file  Social History Narrative   Born in [place], raised in [place], moved to [place] in the 60s   Son lives in the [place]       Family History  Problem Relation Age of Onset  . Anxiety disorder Other   . Hyperlipidemia Father   . Hypertension Father   . CAD Father   . Colon cancer Neg Hx   . Prostate cancer Neg Hx      Allergies as of 11/08/2018   No Known Allergies     Medication List       Accurate as of November 08, 2018 10:16 AM. Always use your most recent med list.        ALPRAZolam 0.25 MG tablet Commonly known as:  XANAX Take 1 tablet (0.25 mg total) by mouth every 8 (eight) hours as needed for anxiety.   gabapentin 300 MG capsule Commonly known as:  NEURONTIN Take by mouth.   irbesartan-hydrochlorothiazide 150-12.5 MG tablet Commonly known as:  AVALIDE Take 1 tablet by mouth daily.   ondansetron 4 MG tablet Commonly known as:  ZOFRAN Take 1 tablet (4 mg total) by mouth every 8 (eight) hours as needed for nausea or vomiting.   pantoprazole 40 MG tablet Commonly known as:  PROTONIX Take 1 tablet (40 mg total) by mouth 2 (two) times daily.   pravastatin 10 MG tablet Commonly known as:  PRAVACHOL Take 1 tablet (10 mg total) by mouth daily.   tamsulosin 0.4 MG Caps capsule Commonly known as:  FLOMAX Take 1 capsule by mouth Daily.   timolol 0.5 % ophthalmic solution Commonly known as:  TIMOPTIC PLACE 1 DROP INTO BOTH EYES TWICE DAILY           Objective:   Physical Exam BP 118/78 (BP Location: Left Arm, Patient Position:  Sitting, Cuff Size: Normal)   Pulse (!) 57   Temp 97.9 F (36.6 C) (Oral)   Resp 16   Ht 5\' 9"  (1.753 m)   Wt 219 lb 2 oz (99.4 kg)   SpO2 97%   BMI 32.36 kg/m  General: Well developed, NAD, BMI noted Neck: No  thyromegaly  HEENT:  Normocephalic . Face symmetric, atraumatic Lungs:  CTA B Normal respiratory effort, no intercostal retractions, no accessory muscle use. Heart: RRR,  no murmur.  No pretibial edema bilaterally  Abdomen:  Not distended, soft, non-tender. No rebound or rigidity.   Skin: Exposed areas without rash. Not pale. Not jaundice Neurologic:  alert & oriented X3.  Speech normal, gait appropriate for age and unassisted Strength symmetric and appropriate for age.  Psych: Cognition and judgment appear intact.  Cooperative with normal attention span and concentration.  Behavior appropriate. No anxious or depressed appearing.     Assessment & Plan:    Assessment Prediabetes HTN Bradycardia: Holter 2016 essentially negative Hyperlipidemia Anxiety: on xanax, stress related to interstitial cystitis Insomnia: On melatonin, not interested in other medications DJD CTS: B, offered surgery before  Obesity: BMI 32 Glaucoma GU: Dr. Amalia Hailey --BPH, TURP --Interstitial cystitis (on gabapentin prn) Venous lake, right inferior clavicle  PLAN Prediabetes: Check A1c HTN: Seems controlled on Avalide.  Last BMP satisfactory.  Recommend to monitor BPs at home Hyperlipidemia: On Pravachol, last LFTs normal, check a FLP Recently seen by GI with nausea, doing better.  Has follow-up today RTC 1 year

## 2018-11-09 NOTE — Assessment & Plan Note (Signed)
-  Td :2017 ; pnm 23 2004,  booster 2016; prevanr  2015; zostavax-- 2014 per pt;  reports had shingrix (x2); had a flu shot  - CCS   cscope 2012, Cscope 12/2017, next per GI a -Prostate ca screening-- per urology -Discussed diet-exercise , he is doing well -labs reviewed, will get a FLP and A1c

## 2018-11-10 ENCOUNTER — Encounter: Payer: Medicare Other | Admitting: Gastroenterology

## 2018-11-16 ENCOUNTER — Encounter: Payer: Medicare Other | Admitting: Internal Medicine

## 2018-11-22 ENCOUNTER — Encounter: Payer: Self-pay | Admitting: Gastroenterology

## 2018-11-23 ENCOUNTER — Encounter (INDEPENDENT_AMBULATORY_CARE_PROVIDER_SITE_OTHER): Payer: Self-pay | Admitting: Physical Medicine and Rehabilitation

## 2018-11-23 ENCOUNTER — Ambulatory Visit (INDEPENDENT_AMBULATORY_CARE_PROVIDER_SITE_OTHER): Payer: Medicare Other | Admitting: Physical Medicine and Rehabilitation

## 2018-11-23 DIAGNOSIS — R202 Paresthesia of skin: Secondary | ICD-10-CM

## 2018-11-23 NOTE — Progress Notes (Signed)
 .  Numeric Pain Rating Scale and Functional Assessment Average Pain 7   In the last MONTH (on 0-10 scale) has pain interfered with the following?  1. General activity like being  able to carry out your everyday physical activities such as walking, climbing stairs, carrying groceries, or moving a chair?  Rating(6)     

## 2018-11-29 ENCOUNTER — Encounter: Payer: Self-pay | Admitting: Gastroenterology

## 2018-11-29 ENCOUNTER — Ambulatory Visit (AMBULATORY_SURGERY_CENTER): Payer: Medicare Other | Admitting: Gastroenterology

## 2018-11-29 VITALS — BP 127/69 | HR 46 | Temp 97.1°F | Resp 9 | Ht 69.0 in | Wt 217.0 lb

## 2018-11-29 DIAGNOSIS — R1031 Right lower quadrant pain: Secondary | ICD-10-CM | POA: Diagnosis not present

## 2018-11-29 DIAGNOSIS — R110 Nausea: Secondary | ICD-10-CM

## 2018-11-29 DIAGNOSIS — K2960 Other gastritis without bleeding: Secondary | ICD-10-CM | POA: Diagnosis not present

## 2018-11-29 DIAGNOSIS — K3189 Other diseases of stomach and duodenum: Secondary | ICD-10-CM

## 2018-11-29 MED ORDER — SODIUM CHLORIDE 0.9 % IV SOLN: 500.0000 mL | Freq: Once | INTRAVENOUS | Status: DC

## 2018-11-29 NOTE — Patient Instructions (Signed)
Information on gastritis given to you today.  Await pathology results.  No aspirin, naproxen, ibuprofen or other non steroidal anti-inflammatory drugs.  Resume protonix 40mg  daily.  YOU HAD AN ENDOSCOPIC PROCEDURE TODAY AT [facility]:   Refer to the procedure report that was given to you for any specific questions about what was found during the examination.  If the procedure report does not answer your questions, please call your gastroenterologist to clarify.  If you requested that your care partner not be given the details of your procedure findings, then the procedure report has been included in a sealed envelope for you to review at your convenience later.  YOU SHOULD EXPECT: Some feelings of bloating in the abdomen. Passage of more gas than usual.  Walking can help get rid of the air that was put into your GI tract during the procedure and reduce the bloating. If you had a lower endoscopy (such as a colonoscopy or flexible sigmoidoscopy) you may notice spotting of blood in your stool or on the toilet paper. If you underwent a bowel prep for your procedure, you may not have a normal bowel movement for a few days.  Please Note:  You might notice some irritation and congestion in your nose or some drainage.  This is from the oxygen used during your procedure.  There is no need for concern and it should clear up in a day or so.  SYMPTOMS TO REPORT IMMEDIATELY:    Following upper endoscopy (EGD)  Vomiting of blood or coffee ground material  New chest pain or pain under the shoulder blades  Painful or persistently difficult swallowing  New shortness of breath  Fever of 100F or higher  Black, tarry-looking stools  For urgent or emergent issues, a gastroenterologist can be reached at any hour by calling [phone].   DIET:  We do recommend a small meal at first, but then you may proceed to your regular diet.  Drink plenty of fluids but you should avoid alcoholic  beverages for 24 hours.  ACTIVITY:  You should plan to take it easy for the rest of today and you should NOT DRIVE or use heavy machinery until tomorrow (because of the sedation medicines used during the test).    FOLLOW UP: Our staff will call the number listed on your records the next business day following your procedure to check on you and address any questions or concerns that you may have regarding the information given to you following your procedure. If we do not reach you, we will leave a message.  However, if you are feeling well and you are not experiencing any problems, there is no need to return our call.  We will assume that you have returned to your regular daily activities without incident.  If any biopsies were taken you will be contacted by phone or by letter within the next 1-3 weeks.  Please call us at [phone] if you have not heard about the biopsies in 3 weeks.    SIGNATURES/CONFIDENTIALITY: You and/or your care partner have signed paperwork which will be entered into your electronic medical record.  These signatures attest to the fact that that the information above on your After Visit Summary has been reviewed and is understood.  Full responsibility of the confidentiality of this discharge information lies with you and/or your care-partner.

## 2018-11-29 NOTE — Progress Notes (Signed)
PT taken to PACU. Monitors in place. VSS. Report given to RN. 

## 2018-11-29 NOTE — Op Note (Signed)
[name]: Robert Krause Procedure Date: 11/29/2018 2:01 PM MRN: [id number] Endoscopist: Ladene Artist , MD Age: 85 Referring MD:  Date of Birth: 11-01-1934 Gender: Male Account #: [id number] Procedure:                Upper GI endoscopy Indications:              Abdominal pain in the right lower quadrant, Nausea Medicines:                Monitored Anesthesia Care Procedure:                Pre-Anesthesia Assessment:                           - Prior to the procedure, a History and Physical                            was performed, and patient medications and                            allergies were reviewed. The patient's tolerance of                            previous anesthesia was also reviewed. The risks                            and benefits of the procedure and the sedation                            options and risks were discussed with the patient.                            All questions were answered, and informed consent                            was obtained. Prior Anticoagulants: The patient has                            taken no previous anticoagulant or antiplatelet                            agents. ASA Grade Assessment: II - A patient with                            mild systemic disease. After reviewing the risks                            and benefits, the patient was deemed in                            satisfactory condition to undergo the procedure.                           After obtaining informed consent, the endoscope was  passed under direct vision. Throughout the                            procedure, the patient's blood pressure, pulse, and                            oxygen saturations were monitored continuously. The                            Endoscope was introduced through the mouth, and                            advanced to the second part of duodenum. The upper                            GI  endoscopy was accomplished without difficulty.                            The patient tolerated the procedure well. Scope In: Scope Out: Findings:                 The examined esophagus was normal.                           A few dispersed, small non-bleeding erosions were                            found in the gastric fundus and in the gastric                            body. There were no stigmata of recent bleeding.                            Biopsies were taken with a cold forceps for                            histology.                           The exam of the stomach was otherwise normal.                           The duodenal bulb and second portion of the                            duodenum were normal. Complications:            No immediate complications. Estimated Blood Loss:     Estimated blood loss was minimal. Impression:               - Normal esophagus.                           - Non-bleeding erosive gastropathy. Biopsied.                           -  Normal duodenal bulb and second portion of the                            duodenum. Recommendation:           - Patient has a contact number available for                            emergencies. The signs and symptoms of potential                            delayed complications were discussed with the                            patient. Return to normal activities tomorrow.                            Written discharge instructions were provided to the                            patient.                           - Resume previous diet.                           - Continue present medications.                           - Resume pantoprazole 40 mg po qd.                           - Await pathology results.                           - No aspirin, ibuprofen, naproxen, or other                            non-steroidal anti-inflammatory drugs. Ladene Artist, MD 11/29/2018 2:20:50 PM This report has been signed  electronically.

## 2018-11-29 NOTE — Progress Notes (Signed)
Called to room to assist during endoscopic procedure.  Patient ID and intended procedure confirmed with present staff. Received instructions for my participation in the procedure from the performing physician.  

## 2018-11-30 ENCOUNTER — Telehealth: Payer: Self-pay

## 2018-11-30 NOTE — Telephone Encounter (Signed)
Left message on f/u call 

## 2018-11-30 NOTE — Telephone Encounter (Signed)
  Follow up Call-  Call back number 11/29/2018 01/05/2018  Post procedure Call Back phone  # [phone]  Permission to leave phone message Yes Yes  Some recent data might be hidden     Patient questions:  Do you have a fever, pain , or abdominal swelling? No. Pain Score  0 *  Have you tolerated food without any problems? Yes.    Have you been able to return to your normal activities? Yes.    Do you have any questions about your discharge instructions: Diet   No. Medications  No. Follow up visit  No.  Do you have questions or concerns about your Care? No.  Actions: * If pain score is 4 or above: No action needed, pain <4.

## 2018-12-03 NOTE — Progress Notes (Signed)
Robert Krause - 85 y.o. male MRN [id number]  Date of birth: December 08, 1933  Office Visit Note: Visit Date: 11/23/2018 PCP: Colon Branch, MD Referred by: Colon Branch, MD  Subjective: Chief Complaint  Patient presents with  . Right Hand - Pain, Numbness, Tingling  . Left Hand - Pain, Numbness, Tingling   HPI: Coulter IDA UPPAL is a 85 y.o. male who comes in today At the request of Benita Stabile, PA-C for letter diagnostic study of both upper limbs.  Patient's been having chronic worsening 2 years of numbness tingling in the right and left hand in a nondermatomal fashion and all of the fingers in all left hand.  He does have a positive flick sign with pain tingling at night better with shaking his hands.  Hand bracing has helped to some degree.  Using his hands seem to make the symptoms worse.  He does feel like he is getting somewhat weaker.  He is left-hand dominant.  He denies any frank radicular pain or specific trauma  ROS Otherwise per HPI.  Assessment & Plan: Visit Diagnoses:  1. Paresthesia of skin     Plan: Impression: The above electrodiagnostic study is ABNORMAL and reveals evidence of:  1.  A severe right median nerve entrapment at the wrist (carpal tunnel syndrome) affecting sensory and motor components.   2.  A moderate to severe left median nerve entrapment at the wrist (carpal tunnel syndrome) affecting sensory and motor components.   There is no significant electrodiagnostic evidence of any other focal nerve entrapment, brachial plexopathy or cervical radiculopathy.   Recommendations: 1.  Follow-up with referring physician. 2.  Continue current management of symptoms. 3.  Continue use of resting splint at night-time and as needed during the day. 4.  Suggest surgical evaluation. Impression: The above electrodiagnostic study is ABNORMAL and reveals evidence of:  1.  A severe right median nerve entrapment at the wrist (carpal tunnel syndrome) affecting sensory and motor  components.   2.  A moderate to severe left median nerve entrapment at the wrist (carpal tunnel syndrome) affecting sensory and motor components.   There is no significant electrodiagnostic evidence of any other focal nerve entrapment, brachial plexopathy or cervical radiculopathy.   Recommendations: 1.  Follow-up with referring physician. 2.  Continue current management of symptoms. 3.  Continue use of resting splint at night-time and as needed during the day. 4.  Suggest surgical evaluation.   Meds & Orders: No orders of the defined types were placed in this encounter.   Orders Placed This Encounter  Procedures  . NCV with EMG (electromyography)    Follow-up: Return for Benita Stabile, P.A.-C.   Procedures: No procedures performed  EMG & NCV Findings: Evaluation of the left median motor nerve showed prolonged distal onset latency (8.4 ms) and decreased conduction velocity (Elbow-Wrist, 46 m/s).  The right median motor nerve showed prolonged distal onset latency (8.0 ms), reduced amplitude (4.0 mV), and decreased conduction velocity (Elbow-Wrist, 42 m/s).  The left median (across palm) sensory nerve showed prolonged distal peak latency (Wrist, 7.0 ms), reduced amplitude (4.9 V), and prolonged distal peak latency (Palm, 5.5 ms).  The right median (across palm) sensory nerve showed no response (Palm) and prolonged distal peak latency (13.7 ms).  All remaining nerves (as indicated in the following tables) were within normal limits.  Left vs. Right side comparison data for the ulnar motor nerve indicates abnormal L-R velocity difference (B Elbow-Wrist, 10 m/s) and abnormal L-R velocity difference (  A Elbow-B Elbow, 18 m/s).  All remaining left vs. right side differences were within normal limits.    All examined muscles (as indicated in the following table) showed no evidence of electrical instability.    Impression: The above electrodiagnostic study is ABNORMAL and reveals evidence of:  1.  A  severe right median nerve entrapment at the wrist (carpal tunnel syndrome) affecting sensory and motor components.   2.  A moderate to severe left median nerve entrapment at the wrist (carpal tunnel syndrome) affecting sensory and motor components.   There is no significant electrodiagnostic evidence of any other focal nerve entrapment, brachial plexopathy or cervical radiculopathy.   Recommendations: 1.  Follow-up with referring physician. 2.  Continue current management of symptoms. 3.  Continue use of resting splint at night-time and as needed during the day. 4.  Suggest surgical evaluation.  ___________________________ Laurence Spates FAAPMR Board Certified, American Board of Physical Medicine and Rehabilitation    Nerve Conduction Studies Anti Sensory Summary Table   Stim Site NR Peak (ms) Norm Peak (ms) P-T Amp (V) Norm P-T Amp Site1 Site2 Delta-P (ms) Dist (cm) Vel (m/s) Norm Vel (m/s)  Left Median Acr Palm Anti Sensory (2nd Digit)  31.5C  Wrist    *7.0 <3.6 *4.9 >10 Wrist Palm 1.5 0.0    Palm    *5.5 <2.0 3.4         Right Median Acr Palm Anti Sensory (2nd Digit)  31.5C  Wrist    *13.7 <3.6 12.2 >10 Wrist Palm  0.0    Palm *NR  <2.0          Left Radial Anti Sensory (Base 1st Digit)  31.4C  Wrist    2.0 <3.1 45.8  Wrist Base 1st Digit 2.0 0.0    Right Radial Anti Sensory (Base 1st Digit)  31.6C  Wrist    2.2 <3.1 19.1  Wrist Base 1st Digit 2.2 0.0    Left Ulnar Anti Sensory (5th Digit)  31.6C  Wrist    3.3 <3.7 15.4 >15.0 Wrist 5th Digit 3.3 14.0 42 >38  Right Ulnar Anti Sensory (5th Digit)  31.6C  Wrist    3.3 <3.7 17.9 >15.0 Wrist 5th Digit 3.3 14.0 42 >38   Motor Summary Table   Stim Site NR Onset (ms) Norm Onset (ms) O-P Amp (mV) Norm O-P Amp Site1 Site2 Delta-0 (ms) Dist (cm) Vel (m/s) Norm Vel (m/s)  Left Median Motor (Abd Poll Brev)  31.4C  Wrist    *8.4 <4.2 6.3 >5 Elbow Wrist 4.8 22.0 *46 >50  Elbow    13.2  6.0         Right Median Motor (Abd Poll  Brev)  31.7C  Wrist    *8.0 <4.2 *4.0 >5 Elbow Wrist 5.3 22.0 *42 >50  Elbow    13.3  3.9         Left Ulnar Motor (Abd Dig Min)  31.6C  Wrist    3.1 <4.2 6.3 >3 B Elbow Wrist 4.1 22.0 54 >53  B Elbow    7.2  6.4  A Elbow B Elbow 1.9 10.0 53 >53  A Elbow    9.1  6.1         Right Ulnar Motor (Abd Dig Min)  31.8C  Wrist    3.4 <4.2 6.3 >3 B Elbow Wrist 3.6 23.0 64 >53  B Elbow    7.0  6.3  A Elbow B Elbow 1.4 10.0 71 >53  A Elbow  8.4  6.4          EMG   Side Muscle Nerve Root Ins Act Fibs Psw Amp Dur Poly Recrt Int Fraser Din Comment  Left Abd Poll Brev Median C8-T1 Nml Nml Nml Nml Nml 0 Nml Nml   Left 1stDorInt Ulnar C8-T1 Nml Nml Nml Nml Nml 0 Nml Nml   Left PronatorTeres Median C6-7 Nml Nml Nml Nml Nml 0 Nml Nml   Left Biceps Musculocut C5-6 Nml Nml Nml Nml Nml 0 Nml Nml   Left Deltoid Axillary C5-6 Nml Nml Nml Nml Nml 0 Nml Nml     Nerve Conduction Studies Anti Sensory Left/Right Comparison   Stim Site L Lat (ms) R Lat (ms) L-R Lat (ms) L Amp (V) R Amp (V) L-R Amp (%) Site1 Site2 L Vel (m/s) R Vel (m/s) L-R Vel (m/s)  Median Acr Palm Anti Sensory (2nd Digit)  31.5C  Wrist *7.0 *13.7 6.7 *4.9 12.2 59.8 Wrist Palm     Palm *5.5   3.4         Radial Anti Sensory (Base 1st Digit)  31.4C  Wrist 2.0 2.2 0.2 45.8 19.1 58.3 Wrist Base 1st Digit     Ulnar Anti Sensory (5th Digit)  31.6C  Wrist 3.3 3.3 0.0 15.4 17.9 14.0 Wrist 5th Digit 42 42 0   Motor Left/Right Comparison   Stim Site L Lat (ms) R Lat (ms) L-R Lat (ms) L Amp (mV) R Amp (mV) L-R Amp (%) Site1 Site2 L Vel (m/s) R Vel (m/s) L-R Vel (m/s)  Median Motor (Abd Poll Brev)  31.4C  Wrist *8.4 *8.0 0.4 6.3 *4.0 36.5 Elbow Wrist *46 *42 4  Elbow 13.2 13.3 0.1 6.0 3.9 35.0       Ulnar Motor (Abd Dig Min)  31.6C  Wrist 3.1 3.4 0.3 6.3 6.3 0.0 B Elbow Wrist 54 64 *10  B Elbow 7.2 7.0 0.2 6.4 6.3 1.6 A Elbow B Elbow 53 71 *18  A Elbow 9.1 8.4 0.7 6.1 6.4 4.7          Waveforms:                     Clinical  History: No specialty comments available.   He reports that he has never smoked. He has never used smokeless tobacco.  Recent Labs    11/08/18 1033  HGBA1C 5.6    Objective:  VS:  HT:    WT:   BMI:     BP:   HR: bpm  TEMP: ( )  RESP:  Physical Exam Musculoskeletal:        General: No tenderness.     Comments: Inspection reveals flattening of the right APB compared to left but no atrophy of the bilateral FDI or hand intrinsics. There is no swelling, color changes, allodynia or dystrophic changes. There is 5 out of 5 strength in the bilateral wrist extension, finger abduction and long finger flexion.  There is decreased sensation in the median nerve distribution bilaterally.  There is a negative There is a negative Hoffmann's test bilaterally.  Skin:    General: Skin is warm and dry.     Findings: No erythema or rash.  Neurological:     Mental Status: He is alert and oriented to person, place, and time.     Motor: No abnormal muscle tone.     Coordination: Coordination normal.     Ortho Exam Imaging: No results found.  Past Medical/Family/Surgical/Social History: Medications & Allergies  reviewed per EMR, new medications updated. Patient Active Problem List   Diagnosis Date Noted  . CTS (carpal tunnel syndrome)-- B , was rec surgery before 01/27/2017  . Abdominal pain, right upper quadrant 04/24/2016  . PCP NOTES >>>>>> 11/08/2015  . Annual physical exam >>>>>>>>>>>>>>>>>>> 07/18/2014  . Chronic asthmatic bronchitis ([place]) 11/22/2012  . Colon polyp 04/14/2011  . 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 ACTINIC KERATOSIS, CHEEK, LEFT 08/28/2009  . PROSTATITIS, ACUTE, CHRONIC 03/26/2009  . FINGER SUP FB W/O MAJ OPEN WOUND&W/O MENTION INF 02/11/2008  . Hyperlipidemia 09/28/2007  . Essential hypertension 09/28/2007  . OTHER CHRONIC SINUSITIS 09/28/2007  . ALLERGIC RHINITIS 09/28/2007  . CYSTITIS, CHRONIC INTERSTITIAL -- xanax prn, Dr Amalia Hailey  09/28/2007  . OSTEOARTHRITIS  09/28/2007   Past Medical History:  Diagnosis Date  . Adenomatous polyp of colon 10/2005  . Allergic rhinitis   . Anxiety   . BPH (benign prostatic hyperplasia)   . Chronic interstitial cystitis    Dr Amalia Hailey  . Chronic sinusitis   . CTS (carpal tunnel syndrome) 01/27/2017  . Glaucoma   . Hyperlipidemia   . Hypertension   . Osteoarthritis   . [place]    right inferior clavicle, shave   Family History  Problem Relation Age of Onset  . Anxiety disorder Other   . Hyperlipidemia Father   . Hypertension Father   . CAD Father   . Colon cancer Neg Hx   . Prostate cancer Neg Hx   . Esophageal cancer Neg Hx   . Rectal cancer Neg Hx   . Stomach cancer Neg Hx    Past Surgical History:  Procedure Laterality Date  . APPENDECTOMY    . CATARACT EXTRACTION Right 06-2015  . POLYPECTOMY    . TRANSURETHRAL RESECTION OF PROSTATE     Social History   Occupational History  . Occupation: retired- Company secretary: RETIRED  Tobacco Use  . Smoking status: Never Smoker  . Smokeless tobacco: Never Used  Substance and Sexual Activity  . Alcohol use: Yes    Alcohol/week: 3.0 standard drinks    Types: 3 Glasses of wine per week    Comment: wine  . Drug use: No  . Sexual activity: Not on file

## 2018-12-03 NOTE — Procedures (Signed)
EMG & NCV Findings: Evaluation of the left median motor nerve showed prolonged distal onset latency (8.4 ms) and decreased conduction velocity (Elbow-Wrist, 46 m/s).  The right median motor nerve showed prolonged distal onset latency (8.0 ms), reduced amplitude (4.0 mV), and decreased conduction velocity (Elbow-Wrist, 42 m/s).  The left median (across palm) sensory nerve showed prolonged distal peak latency (Wrist, 7.0 ms), reduced amplitude (4.9 V), and prolonged distal peak latency (Palm, 5.5 ms).  The right median (across palm) sensory nerve showed no response (Palm) and prolonged distal peak latency (13.7 ms).  All remaining nerves (as indicated in the following tables) were within normal limits.  Left vs. Right side comparison data for the ulnar motor nerve indicates abnormal L-R velocity difference (B Elbow-Wrist, 10 m/s) and abnormal L-R velocity difference (A Elbow-B Elbow, 18 m/s).  All remaining left vs. right side differences were within normal limits.    All examined muscles (as indicated in the following table) showed no evidence of electrical instability.    Impression: The above electrodiagnostic study is ABNORMAL and reveals evidence of:  1.  A severe right median nerve entrapment at the wrist (carpal tunnel syndrome) affecting sensory and motor components.   2.  A moderate to severe left median nerve entrapment at the wrist (carpal tunnel syndrome) affecting sensory and motor components.   There is no significant electrodiagnostic evidence of any other focal nerve entrapment, brachial plexopathy or cervical radiculopathy.   Recommendations: 1.  Follow-up with referring physician. 2.  Continue current management of symptoms. 3.  Continue use of resting splint at night-time and as needed during the day. 4.  Suggest surgical evaluation.  ___________________________ Laurence Spates FAAPMR Board Certified, American Board of Physical Medicine and Rehabilitation    Nerve Conduction  Studies Anti Sensory Summary Table   Stim Site NR Peak (ms) Norm Peak (ms) P-T Amp (V) Norm P-T Amp Site1 Site2 Delta-P (ms) Dist (cm) Vel (m/s) Norm Vel (m/s)  Left Median Acr Palm Anti Sensory (2nd Digit)  31.5C  Wrist    *7.0 <3.6 *4.9 >10 Wrist Palm 1.5 0.0    Palm    *5.5 <2.0 3.4         Right Median Acr Palm Anti Sensory (2nd Digit)  31.5C  Wrist    *13.7 <3.6 12.2 >10 Wrist Palm  0.0    Palm *NR  <2.0          Left Radial Anti Sensory (Base 1st Digit)  31.4C  Wrist    2.0 <3.1 45.8  Wrist Base 1st Digit 2.0 0.0    Right Radial Anti Sensory (Base 1st Digit)  31.6C  Wrist    2.2 <3.1 19.1  Wrist Base 1st Digit 2.2 0.0    Left Ulnar Anti Sensory (5th Digit)  31.6C  Wrist    3.3 <3.7 15.4 >15.0 Wrist 5th Digit 3.3 14.0 42 >38  Right Ulnar Anti Sensory (5th Digit)  31.6C  Wrist    3.3 <3.7 17.9 >15.0 Wrist 5th Digit 3.3 14.0 42 >38   Motor Summary Table   Stim Site NR Onset (ms) Norm Onset (ms) O-P Amp (mV) Norm O-P Amp Site1 Site2 Delta-0 (ms) Dist (cm) Vel (m/s) Norm Vel (m/s)  Left Median Motor (Abd Poll Brev)  31.4C  Wrist    *8.4 <4.2 6.3 >5 Elbow Wrist 4.8 22.0 *46 >50  Elbow    13.2  6.0         Right Median Motor (Abd Poll Brev)  31.7C  Wrist    *8.0 <4.2 *4.0 >5 Elbow Wrist 5.3 22.0 *42 >50  Elbow    13.3  3.9         Left Ulnar Motor (Abd Dig Min)  31.6C  Wrist    3.1 <4.2 6.3 >3 B Elbow Wrist 4.1 22.0 54 >53  B Elbow    7.2  6.4  A Elbow B Elbow 1.9 10.0 53 >53  A Elbow    9.1  6.1         Right Ulnar Motor (Abd Dig Min)  31.8C  Wrist    3.4 <4.2 6.3 >3 B Elbow Wrist 3.6 23.0 64 >53  B Elbow    7.0  6.3  A Elbow B Elbow 1.4 10.0 71 >53  A Elbow    8.4  6.4          EMG   Side Muscle Nerve Root Ins Act Fibs Psw Amp Dur Poly Recrt Int Fraser Din Comment  Left Abd Poll Brev Median C8-T1 Nml Nml Nml Nml Nml 0 Nml Nml   Left 1stDorInt Ulnar C8-T1 Nml Nml Nml Nml Nml 0 Nml Nml   Left PronatorTeres Median C6-7 Nml Nml Nml Nml Nml 0 Nml Nml   Left Biceps  Musculocut C5-6 Nml Nml Nml Nml Nml 0 Nml Nml   Left Deltoid Axillary C5-6 Nml Nml Nml Nml Nml 0 Nml Nml     Nerve Conduction Studies Anti Sensory Left/Right Comparison   Stim Site L Lat (ms) R Lat (ms) L-R Lat (ms) L Amp (V) R Amp (V) L-R Amp (%) Site1 Site2 L Vel (m/s) R Vel (m/s) L-R Vel (m/s)  Median Acr Palm Anti Sensory (2nd Digit)  31.5C  Wrist *7.0 *13.7 6.7 *4.9 12.2 59.8 Wrist Palm     Palm *5.5   3.4         Radial Anti Sensory (Base 1st Digit)  31.4C  Wrist 2.0 2.2 0.2 45.8 19.1 58.3 Wrist Base 1st Digit     Ulnar Anti Sensory (5th Digit)  31.6C  Wrist 3.3 3.3 0.0 15.4 17.9 14.0 Wrist 5th Digit 42 42 0   Motor Left/Right Comparison   Stim Site L Lat (ms) R Lat (ms) L-R Lat (ms) L Amp (mV) R Amp (mV) L-R Amp (%) Site1 Site2 L Vel (m/s) R Vel (m/s) L-R Vel (m/s)  Median Motor (Abd Poll Brev)  31.4C  Wrist *8.4 *8.0 0.4 6.3 *4.0 36.5 Elbow Wrist *46 *42 4  Elbow 13.2 13.3 0.1 6.0 3.9 35.0       Ulnar Motor (Abd Dig Min)  31.6C  Wrist 3.1 3.4 0.3 6.3 6.3 0.0 B Elbow Wrist 54 64 *10  B Elbow 7.2 7.0 0.2 6.4 6.3 1.6 A Elbow B Elbow 53 71 *18  A Elbow 9.1 8.4 0.7 6.1 6.4 4.7          Waveforms:

## 2018-12-06 ENCOUNTER — Ambulatory Visit (INDEPENDENT_AMBULATORY_CARE_PROVIDER_SITE_OTHER): Payer: Medicare Other | Admitting: Physician Assistant

## 2018-12-06 ENCOUNTER — Encounter (INDEPENDENT_AMBULATORY_CARE_PROVIDER_SITE_OTHER): Payer: Self-pay | Admitting: Physician Assistant

## 2018-12-06 ENCOUNTER — Encounter: Payer: Self-pay | Admitting: Gastroenterology

## 2018-12-06 DIAGNOSIS — G5603 Carpal tunnel syndrome, bilateral upper limbs: Secondary | ICD-10-CM | POA: Diagnosis not present

## 2018-12-06 NOTE — Progress Notes (Signed)
HPI: Robert Krause comes in today to review his nerve conduction studies of bilateral upper extremities.  He continues to have numbness tingling and pain in both hands.  Left greater than right.  Again patient is nondiabetic.  He has had no injury to either hand.  EMG nerve conduction studies show severe right median nerve entrapment and moderate to severe left median nerve entrapment.  Physical exam: General patient is well-developed well-nourished male in no acute distress. Bilateral hands he has thenar atrophy of the right hand.  Sensation grossly intact bilateral hands throughout.  Impression: Bilateral carpal tunnel syndrome  Plan: Due to the fact the patient has severe numbness tingling in his left hand despite EMG nerve conduction study showing the right median nerve entrapment at the wrist can be severe and the left moderate to severe. Will we will proceed with a left carpal tunnel release in the near future.  Risk benefits discussed with patient at length.  Postoperative protocol discussed with patient.  Questions encouraged and answered at length.  He will call Robert Krause and schedule this once he knows to schedule a little bit better.

## 2018-12-23 ENCOUNTER — Other Ambulatory Visit: Payer: Self-pay | Admitting: Internal Medicine

## 2019-02-04 ENCOUNTER — Other Ambulatory Visit: Payer: Self-pay | Admitting: Internal Medicine

## 2019-04-24 ENCOUNTER — Other Ambulatory Visit: Payer: Self-pay | Admitting: Gastroenterology

## 2019-05-26 ENCOUNTER — Ambulatory Visit (INDEPENDENT_AMBULATORY_CARE_PROVIDER_SITE_OTHER): Payer: Medicare Other | Admitting: Internal Medicine

## 2019-05-26 ENCOUNTER — Encounter: Payer: Self-pay | Admitting: Internal Medicine

## 2019-05-26 ENCOUNTER — Ambulatory Visit: Payer: Self-pay | Admitting: *Deleted

## 2019-05-26 ENCOUNTER — Other Ambulatory Visit: Payer: Self-pay

## 2019-05-26 VITALS — BP 184/78 | HR 58 | Temp 97.9°F | Resp 18 | Ht 69.0 in | Wt 213.0 lb

## 2019-05-26 DIAGNOSIS — I10 Essential (primary) hypertension: Secondary | ICD-10-CM

## 2019-05-26 DIAGNOSIS — I499 Cardiac arrhythmia, unspecified: Secondary | ICD-10-CM | POA: Diagnosis not present

## 2019-05-26 DIAGNOSIS — F419 Anxiety disorder, unspecified: Secondary | ICD-10-CM

## 2019-05-26 LAB — BASIC METABOLIC PANEL
BUN: 12 mg/dL (ref 6–23)
CO2: 28 mEq/L (ref 19–32)
Calcium: 9 mg/dL (ref 8.4–10.5)
Chloride: 106 mEq/L (ref 96–112)
Creatinine, Ser: 1.02 mg/dL (ref 0.40–1.50)
GFR: 69.82 mL/min (ref >60.00)
Glucose, Bld: 111 mg/dL — ABNORMAL HIGH (ref 70–99)
Potassium: 4.4 mEq/L (ref 3.5–5.1)
Sodium: 137 mEq/L (ref 135–145)

## 2019-05-26 LAB — CBC WITH DIFFERENTIAL/PLATELET
Basophils Absolute: 0.1 10*3/uL (ref 0.0–0.1)
Basophils Relative: 1.4 % (ref 0.0–3.0)
Eosinophils Absolute: 0.3 10*3/uL (ref 0.0–0.7)
Eosinophils Relative: 5.5 % — ABNORMAL HIGH (ref 0.0–5.0)
HCT: 47.2 % (ref 39.0–52.0)
Hemoglobin: 15.8 g/dL (ref 13.0–17.0)
Lymphocytes Relative: 25 % (ref 12.0–46.0)
Lymphs Abs: 1.6 10*3/uL (ref 0.7–4.0)
MCHC: 33.5 g/dL (ref 30.0–36.0)
MCV: 94.6 fl (ref 78.0–100.0)
Monocytes Absolute: 0.6 10*3/uL (ref 0.1–1.0)
Monocytes Relative: 9.1 % (ref 3.0–12.0)
Neutro Abs: 3.7 10*3/uL (ref 1.4–7.7)
Neutrophils Relative %: 59 % (ref 43.0–77.0)
Platelets: 194 10*3/uL (ref 150.0–400.0)
RBC: 4.99 Mil/uL (ref 4.22–5.81)
RDW: 13 % (ref 11.5–15.5)
WBC: 6.3 10*3/uL (ref 4.0–10.5)

## 2019-05-26 LAB — MAGNESIUM: Magnesium: 2 mg/dL (ref 1.5–2.5)

## 2019-05-26 MED ORDER — ESCITALOPRAM OXALATE 10 MG PO TABS: 10.0000 mg | ORAL_TABLET | Freq: Every day | ORAL | 1 refills | Status: DC

## 2019-05-26 NOTE — Telephone Encounter (Signed)
Spoke w/ Pt- still refuses to go to ED or call 911. He informed that his pulse rate is currently 62, not having any lightheadedness. Asked Pt to come to office at 11am- he is not having any covid sx's.

## 2019-05-26 NOTE — Telephone Encounter (Signed)
Patient is calling to report that he is having fluctuations in his heart rate- last night he experienced lightheadedness and his heart rate dropped to 26. Today it is back up to 60. Patient states he wants to be seen by PCP so he can get referral to cardiologist- per protocol advised ED- patient refuses to go to ED- he states he is not having symptoms now- and he is afraid of COVID. Call to office- they are advising the same- patient needs to go to ED- patient is upset and does not want to go. Advised if he had drop in heart rate or has symptoms he has to go.  Reason for Disposition . Dizziness, lightheadedness, or weakness  Answer Assessment - Initial Assessment Questions 1. DESCRIPTION: "Please describe your heart rate or heart beat that you are having" (e.g., fast/slow, regular/irregular, skipped or extra beats, "palpitations")     Fast/slow 2. ONSET: "When did it start?" (Minutes, hours or days)      3-4 days 3. DURATION: "How long does it last" (e.g., seconds, minutes, hours)     Patient has symptoms of lightheadedness- he is not sure how long it is occurring 4. PATTERN "Does it come and go, or has it been constant since it started?"  "Does it get worse with exertion?"   "Are you feeling it now?"     Coming and going 5. TAP: "Using your hand, can you tap out what you are feeling on a chair or table in front of you, so that I can hear?" (Note: not all patients can do this)       n/a 6. HEART RATE: "Can you tell me your heart rate?" "How many beats in 15 seconds?"  (Note: not all patients can do this)       Last night- 26    Now- 62 7. RECURRENT SYMPTOM: "Have you ever had this before?" If so, ask: "When was the last time?" and "What happened that time?"      No- lowest has been 55 8. CAUSE: "What do you think is causing the palpitations?"     Not sure 9. CARDIAC HISTORY: "Do you have any history of heart disease?" (e.g., heart attack, angina, bypass surgery, angioplasty, arrhythmia)   no 10. OTHER SYMPTOMS: "Do you have any other symptoms?" (e.g., dizziness, chest pain, sweating, difficulty breathing)       Only when occurring-lightheaded 11. PREGNANCY: "Is there any chance you are pregnant?" "When was your last menstrual period?"       n/a  Protocols used: HEART RATE AND HEARTBEAT QUESTIONS-A-AH

## 2019-05-26 NOTE — Telephone Encounter (Signed)
Advise patient: I agree with call 911 or go to the ER.  If he refuses, I could see him at the office today if so desired.

## 2019-05-26 NOTE — Patient Instructions (Signed)
Get the blood work     [place] Schedule your next appointment  For a check up in 6 weeks   Start taking Lexapro 1 tablet every night.  If you have severe symptoms: Chest pain, difficulty breathing, palpitations: Go to the ER

## 2019-05-26 NOTE — Telephone Encounter (Signed)
Pt refusing to go to ED or call 911.

## 2019-05-26 NOTE — Progress Notes (Signed)
Pre visit review using our clinic review tool, if applicable. No additional management support is needed unless otherwise documented below in the visit note. 

## 2019-05-26 NOTE — Progress Notes (Signed)
Subjective:    Patient ID: Robert Krause, male    DOB: 1934/01/30, 85 y.o.   MRN: [id number]  DOS:  05/26/2019 Type of visit - description: Acute visit The patient is concerned because he checked his heart rate with a BP cuff and pulse oximeter and it varies from 70s to 30s. Not taking any new medications Not taking any new OTCs He admits to being extremely anxious due to coronavirus, cannot sleep, feels very scared when he goes to the  grocery store    Review of Systems Denies fever chills No chest pain, cough or difficulty breathing Mild edema from time to time around the ankles Admits to poor sleeping. No suicidal ideas Past Medical History:  Diagnosis Date  . Adenomatous polyp of colon 10/2005  . Allergic rhinitis   . Anxiety   . BPH (benign prostatic hyperplasia)   . Chronic interstitial cystitis    Dr Amalia Hailey  . Chronic sinusitis   . CTS (carpal tunnel syndrome) 01/27/2017  . Glaucoma   . Hyperlipidemia   . Hypertension   . Osteoarthritis   . Venous lake    right inferior clavicle, shave    Past Surgical History:  Procedure Laterality Date  . APPENDECTOMY    . CATARACT EXTRACTION Right 06-2015  . POLYPECTOMY    . TRANSURETHRAL RESECTION OF PROSTATE      Social History   Socioeconomic History  . Marital status: Married    Spouse name: Not on file  . Number of children: 1  . Years of education: Not on file  . Highest education level: Not on file  Occupational History  . Occupation: retiredCorporate treasurer: RETIRED  Social Needs  . Financial resource strain: Not on file  . Food insecurity    Worry: Not on file    Inability: Not on file  . Transportation needs    Medical: Not on file    Non-medical: Not on file  Tobacco Use  . Smoking status: Never Smoker  . Smokeless tobacco: Never Used  Substance and Sexual Activity  . Alcohol use: Yes    Alcohol/week: 3.0 standard drinks    Types: 3 Glasses of wine per week    Comment: wine  . Drug use:  No  . Sexual activity: Not on file  Lifestyle  . Physical activity    Days per week: Not on file    Minutes per session: Not on file  . Stress: Not on file  Relationships  . Social Herbalist on phone: Not on file    Gets together: Not on file    Attends religious service: Not on file    Active member of club or organization: Not on file    Attends meetings of clubs or organizations: Not on file    Relationship status: Not on file  . Intimate partner violence    Fear of current or ex partner: Not on file    Emotionally abused: Not on file    Physically abused: Not on file    Forced sexual activity: Not on file  Other Topics Concern  . Not on file  Social History Narrative   Born in [place], raised in [place], moved to [place] in the 60s   Son lives in the [place] as of 05/26/2019   No Known Allergies     Medication List       Accurate as of May 26, 2019  11:01 AM. If you have any questions, ask your nurse or doctor.        ALPRAZolam 0.25 MG tablet Commonly known as: XANAX Take 1 tablet (0.25 mg total) by mouth every 8 (eight) hours as needed for anxiety.   gabapentin 300 MG capsule Commonly known as: NEURONTIN Take by mouth.   irbesartan-hydrochlorothiazide 150-12.5 MG tablet Commonly known as: AVALIDE Take 1 tablet by mouth daily.   ondansetron 4 MG tablet Commonly known as: ZOFRAN TAKE 1 TABLET(4 MG) BY MOUTH EVERY 8 HOURS AS NEEDED FOR NAUSEA OR VOMITING   pantoprazole 40 MG tablet Commonly known as: PROTONIX Take 1 tablet (40 mg total) by mouth 2 (two) times daily.   pravastatin 10 MG tablet Commonly known as: PRAVACHOL Take 1 tablet (10 mg total) by mouth daily.   tamsulosin 0.4 MG Caps capsule Commonly known as: FLOMAX Take 1 capsule by mouth Daily.   timolol 0.5 % ophthalmic solution Commonly known as: TIMOPTIC PLACE 1 DROP INTO BOTH EYES TWICE DAILY           Objective:   Physical Exam BP (!) 184/78 (BP Location: Left  Arm, Patient Position: Sitting, Cuff Size: Small)   Pulse (!) 58   Temp 97.9 F (36.6 C) (Oral)   Resp 18   Ht 5\' 9"  (1.753 m)   Wt 213 lb (96.6 kg)   SpO2 99%   BMI 31.45 kg/m  General:   Well developed, NAD, BMI noted. HEENT:  Normocephalic . Face symmetric, atraumatic Lungs:  CTA B Normal respiratory effort, no intercostal retractions, no accessory muscle use. Heart: Seems irregular.  No pretibial edema bilaterally  Skin: Not pale. Not jaundice Neurologic:  alert & oriented X3.  Speech normal, gait appropriate for age and unassisted Psych--  Cognition and judgment appear intact.  Cooperative with normal attention span and concentration.  Behavior appropriate. Other recently and shoes but no depressed appearing.      Assessment      Assessment Prediabetes HTN Bradycardia: Holter 2016 essentially negative Hyperlipidemia Anxiety: on xanax, stress related to interstitial cystitis Insomnia: On melatonin, not interested in other medications DJD CTS: B, offered surgery before  Obesity: BMI 32 Glaucoma GU: Dr. Amalia Hailey --BPH, TURP --Interstitial cystitis (on gabapentin prn) Venous lake, right inferior clavicle  PLAN Arrhythmia The patient presents with wide variations of his heart rate, no other cardiovascular symptoms, on EKG, he is in sinus rhythm with frequent ectopy.  I think that accounts for the variation of his heart rate when he checks with pulse oximetry.  He is asymptomatic, refer to cardiology, ER if severe symptoms. We will also check CBC, BMP and magnesium. HTN: BP is elevated today, at home SBP is consistently 120, 130.  We will continue with Avalide. Anxiety: Typically well-controlled, takes Xanax as needed mostly for interstitial cystitis, symptoms definitely worse, related to coronavirus and quarantine.  We talk about options, we agreed that he will probably benefit from the medication.  Start Lexapro, side effects discussed, okay to decrease to half  tablet if he feels that is better. RTC 6 weeks

## 2019-05-29 DIAGNOSIS — F32A Depression, unspecified: Secondary | ICD-10-CM

## 2019-05-29 DIAGNOSIS — F419 Anxiety disorder, unspecified: Secondary | ICD-10-CM

## 2019-05-29 HISTORY — DX: Anxiety disorder, unspecified: F41.9

## 2019-05-29 HISTORY — DX: Depression, unspecified: F32.A

## 2019-05-29 NOTE — Assessment & Plan Note (Signed)
Arrhythmia The patient presents with wide variations of his heart rate, no other cardiovascular symptoms, on EKG, he is in sinus rhythm with frequent ectopy.  I think that accounts for the variation of his heart rate when he checks with pulse oximetry.  He is asymptomatic, refer to cardiology, ER if severe symptoms. We will also check CBC, BMP and magnesium. HTN: BP is elevated today, at home SBP is consistently 120, 130.  We will continue with Avalide. Anxiety: Typically well-controlled, takes Xanax as needed mostly for interstitial cystitis, symptoms definitely worse, related to coronavirus and quarantine.  We talk about options, we agreed that he will probably benefit from the medication.  Start Lexapro, side effects discussed, okay to decrease to half tablet if he feels that is better. RTC 6 weeks

## 2019-06-07 ENCOUNTER — Other Ambulatory Visit: Payer: Self-pay

## 2019-06-07 ENCOUNTER — Ambulatory Visit (INDEPENDENT_AMBULATORY_CARE_PROVIDER_SITE_OTHER): Payer: Medicare Other | Admitting: Cardiology

## 2019-06-07 ENCOUNTER — Encounter: Payer: Self-pay | Admitting: Cardiology

## 2019-06-07 VITALS — BP 138/72 | HR 56 | Ht 69.0 in | Wt 215.0 lb

## 2019-06-07 DIAGNOSIS — I10 Essential (primary) hypertension: Secondary | ICD-10-CM | POA: Diagnosis not present

## 2019-06-07 DIAGNOSIS — R002 Palpitations: Secondary | ICD-10-CM | POA: Diagnosis not present

## 2019-06-07 DIAGNOSIS — R0609 Other forms of dyspnea: Secondary | ICD-10-CM

## 2019-06-07 DIAGNOSIS — E782 Mixed hyperlipidemia: Secondary | ICD-10-CM | POA: Diagnosis not present

## 2019-06-07 MED ORDER — REGADENOSON 0.4 MG/5ML IV SOLN: 0.4000 mg | Freq: Once | INTRAVENOUS | Status: DC

## 2019-06-07 NOTE — Progress Notes (Signed)
Cardiology Office Note:    Date:  06/07/2019   ID:  Robert Krause, DOB January 22, 1934, MRN [id number]  PCP:  Colon Branch, MD  Cardiologist:  Jenean Lindau, MD   Referring MD: Colon Branch, MD    ASSESSMENT:    1. DOE (dyspnea on exertion)   2. Mixed hyperlipidemia   3. Essential hypertension   4. Palpitations    PLAN:    In order of problems listed above:  1. Dyspnea on exertion: He is an otherwise active gentleman.  Certainly coronary artery disease need to be evaluated.  He has multiple risk factors for coronary artery disease.  For this reason we will do a Lexiscan sestamibi. 2. Essential hypertension: Blood pressure steady.  Echocardiogram will help assess left ventricular systolic function and any evidence of left ventricular hypertrophy. 3. In view of his slow heart rate and palpitations we will do a 3-day ZIO monitoring.  Patient's TSH is unremarkable. 4. He will be seen in follow-up appointment in a month or earlier if he has any concerns.  He knows to go to the nearest emergency room for any concerning symptoms.   Medication Adjustments/Labs and Tests Ordered: Current medicines are reviewed at length with the patient today.  Concerns regarding medicines are outlined above.  No orders of the defined types were placed in this encounter.  No orders of the defined types were placed in this encounter.    History of Present Illness:    Robert Krause is a 85 y.o. male who is being seen today for the evaluation of dyspnea on exertion and palpitations at the request of Colon Branch, MD.  Patient is a pleasant 85 year old gentleman with past medical history of essential hypertension and dyslipidemia.  He is an active gentleman but recently has noticed shortness of breath on exertion.  He also mentions to me that he feels his heart slow at times.  He mentions to me that he is check on blood pressure is fine but his pulse sometimes dips into the 20s.  No orthopnea PND no  syncope.  At the time of my evaluation, the patient is alert awake oriented and in no distress.  Past Medical History:  Diagnosis Date  . Adenomatous polyp of colon 10/2005  . Allergic rhinitis   . Anxiety   . BPH (benign prostatic hyperplasia)   . Chronic interstitial cystitis    Dr Amalia Hailey  . Chronic sinusitis   . CTS (carpal tunnel syndrome) 01/27/2017  . Glaucoma   . Hyperlipidemia   . Hypertension   . Osteoarthritis   . Venous lake    right inferior clavicle, shave    Past Surgical History:  Procedure Laterality Date  . APPENDECTOMY    . CATARACT EXTRACTION Right 06-2015  . POLYPECTOMY    . TRANSURETHRAL RESECTION OF PROSTATE      Current Medications: Current Meds  Medication Sig  . ALPRAZolam (XANAX) 0.25 MG tablet Take 1 tablet (0.25 mg total) by mouth every 8 (eight) hours as needed for anxiety.  Marland Kitchen escitalopram (LEXAPRO) 10 MG tablet Take 1 tablet (10 mg total) by mouth daily.  . irbesartan-hydrochlorothiazide (AVALIDE) 150-12.5 MG tablet Take 1 tablet by mouth daily.  . ondansetron (ZOFRAN) 4 MG tablet TAKE 1 TABLET(4 MG) BY MOUTH EVERY 8 HOURS AS NEEDED FOR NAUSEA OR VOMITING  . pantoprazole (PROTONIX) 40 MG tablet Take 1 tablet (40 mg total) by mouth 2 (two) times daily.  . pravastatin (PRAVACHOL) 10 MG tablet Take  1 tablet (10 mg total) by mouth daily.  . Tamsulosin HCl (FLOMAX) 0.4 MG CAPS Take 1 capsule by mouth Daily.  . timolol (TIMOPTIC) 0.5 % ophthalmic solution PLACE 1 DROP INTO BOTH EYES TWICE DAILY     Allergies:   Patient has no known allergies.   Social History   Socioeconomic History  . Marital status: Married    Spouse name: Not on file  . Number of children: 1  . Years of education: Not on file  . Highest education level: Not on file  Occupational History  . Occupation: retiredCorporate treasurer: RETIRED  Social Needs  . Financial resource strain: Not on file  . Food insecurity    Worry: Not on file    Inability: Not on file  .  Transportation needs    Medical: Not on file    Non-medical: Not on file  Tobacco Use  . Smoking status: Never Smoker  . Smokeless tobacco: Never Used  Substance and Sexual Activity  . Alcohol use: Yes    Alcohol/week: 3.0 standard drinks    Types: 3 Glasses of wine per week    Comment: wine  . Drug use: No  . Sexual activity: Not on file  Lifestyle  . Physical activity    Days per week: Not on file    Minutes per session: Not on file  . Stress: Not on file  Relationships  . Social Herbalist on phone: Not on file    Gets together: Not on file    Attends religious service: Not on file    Active member of club or organization: Not on file    Attends meetings of clubs or organizations: Not on file    Relationship status: Not on file  Other Topics Concern  . Not on file  Social History Narrative   Born in [place], raised in [place], moved to [place] in the 60s   Son lives in the [place]      Family History: The patient's family history includes Anxiety disorder in an other family member; CAD in his father; Hyperlipidemia in his father; Hypertension in his father. There is no history of Colon cancer, Prostate cancer, Esophageal cancer, Rectal cancer, or Stomach cancer.  ROS:   Please see the history of present illness.    All other systems reviewed and are negative.  EKGs/Labs/Other Studies Reviewed:    The following studies were reviewed today: EKG reveals sinus bradycardia and nonspecific ST-T changes.  Poor anterior forces.  PVCs were noted.   Recent Labs: 10/04/2018: ALT 16; TSH 2.38 05/26/2019: BUN 12; Creatinine, Ser 1.02; Hemoglobin 15.8; Magnesium 2.0; Platelets 194.0; Potassium 4.4; Sodium 137  Recent Lipid Panel    Component Value Date/Time   CHOL 141 11/08/2018 1033   TRIG 69.0 11/08/2018 1033   TRIG 55 10/05/2006 1439   HDL 44.00 11/08/2018 1033   CHOLHDL 3 11/08/2018 1033   VLDL 13.8 11/08/2018 1033   LDLCALC 84 11/08/2018 1033   LDLDIRECT 153.4  06/06/2013 1156    Physical Exam:    VS:  BP 138/72 (BP Location: Left Arm, Patient Position: Sitting, Cuff Size: Normal)   Pulse (!) 56   Ht 5\' 9"  (1.753 m)   Wt 215 lb (97.5 kg)   SpO2 98%   BMI 31.75 kg/m     Wt Readings from Last 3 Encounters:  06/07/19 215 lb (97.5 kg)  05/26/19 213 lb (96.6 kg)  11/29/18 217 lb (98.4 kg)  GEN: Patient is in no acute distress HEENT: Normal NECK: No JVD; No carotid bruits LYMPHATICS: No lymphadenopathy CARDIAC: S1 S2 regular, 2/6 systolic murmur at the apex. RESPIRATORY:  Clear to auscultation without rales, wheezing or rhonchi  ABDOMEN: Soft, non-tender, non-distended MUSCULOSKELETAL:  No edema; No deformity  SKIN: Warm and dry NEUROLOGIC:  Alert and oriented x 3 PSYCHIATRIC:  Normal affect    Signed, Jenean Lindau, MD  06/07/2019 1:46 PM    [facility]

## 2019-06-07 NOTE — Addendum Note (Signed)
Addended by: Tarri Glenn on: 06/07/2019 02:18 PM   Modules accepted: Orders

## 2019-06-07 NOTE — Patient Instructions (Addendum)
Medication Instructions:    Your physician recommends that you continue on your current medications as directed. Please refer to the Current Medication list given to you today.   If you need a refill on your cardiac medications before your next appointment, please call your pharmacy.   Lab work: NONE  If you have labs (blood work) drawn today and your tests are completely normal, you will receive your results only by: Marland Kitchen MyChart Message (if you have MyChart) OR . A paper copy in the mail If you have any lab test that is abnormal or we need to change your treatment, we will call you to review the results.  Testing/Procedures: ECHOCARDIOGRAM LEXISCAN ZIO PATCH MONITOR-3 Days  Follow-Up: At [facility], you and your health needs are our priority.  As part of our continuing mission to provide you with exceptional heart care, we have created designated Provider Care Teams.  These Care Teams include your primary Cardiologist (physician) and Advanced Practice Providers (APPs -  Physician Assistants and Nurse Practitioners) who all work together to provide you with the care you need, when you need it. . You will need a follow up appointment in 1 months.  Your physician has requested that you have an echocardiogram. Echocardiography is a painless test that uses sound waves to create images of your heart. It provides your doctor with information about the size and shape of your heart and how well your heart's chambers and valves are working. This procedure takes approximately one hour. There are no restrictions for this procedure.   Your physician has requested that you have a lexiscan myoview. For further information please visit [email or web address]. Please follow instruction sheet, as given.    Your physician has recommended that you wear a holter monitor. Holter monitors are medical devices that record the heart's electrical activity. Doctors most often use these monitors to diagnose  arrhythmias. Arrhythmias are problems with the speed or rhythm of the heartbeat. The monitor is a small, portable device. You can wear one while you do your normal daily activities. This is usually used to diagnose what is causing palpitations/syncope (passing out).  Your physician has requested that you have a lexiscan myoview. For further information please visit [email or web address]. Please follow instruction sheet, as given.   Echocardiogram An echocardiogram is a procedure that uses painless sound waves (ultrasound) to produce an image of the heart. Images from an echocardiogram can provide important information about:  Signs of coronary artery disease (CAD).  Aneurysm detection. An aneurysm is a weak or damaged part of an artery wall that bulges out from the normal force of blood pumping through the body.  Heart size and shape. Changes in the size or shape of the heart can be associated with certain conditions, including heart failure, aneurysm, and CAD.  Heart muscle function.  Heart valve function.  Signs of a past heart attack.  Fluid buildup around the heart.  Thickening of the heart muscle.  A tumor or infectious growth around the heart valves. Tell a health care provider about:  Any allergies you have.  All medicines you are taking, including vitamins, herbs, eye drops, creams, and over-the-counter medicines.  Any blood disorders you have.  Any surgeries you have had.  Any medical conditions you have.  Whether you are pregnant or may be pregnant. What are the risks? Generally, this is a safe procedure. However, problems may occur, including:  Allergic reaction to dye (contrast) that may be used during the procedure. What happens before the  procedure? No specific preparation is needed. You may eat and drink normally. What happens during the procedure?   An IV tube may be inserted into one of your veins.  You may receive contrast through this tube. A contrast  is an injection that improves the quality of the pictures from your heart.  A gel will be applied to your chest.  A wand-like tool (transducer) will be moved over your chest. The gel will help to transmit the sound waves from the transducer.  The sound waves will harmlessly bounce off of your heart to allow the heart images to be captured in real-time motion. The images will be recorded on a computer. The procedure may vary among health care providers and hospitals. What happens after the procedure?  You may return to your normal, everyday life, including diet, activities, and medicines, unless your health care provider tells you not to do that. Summary  An echocardiogram is a procedure that uses painless sound waves (ultrasound) to produce an image of the heart.  Images from an echocardiogram can provide important information about the size and shape of your heart, heart muscle function, heart valve function, and fluid buildup around your heart.  You do not need to do anything to prepare before this procedure. You may eat and drink normally.  After the echocardiogram is completed, you may return to your normal, everyday life, unless your health care provider tells you not to do that. This information is not intended to replace advice given to you by your health care provider. Make sure you discuss any questions you have with your health care provider. Document Released: 11/07/2000 Document Revised: 03/03/2019 Document Reviewed: 12/13/2016 Elsevier Patient Education  2020 [employer].

## 2019-06-08 ENCOUNTER — Ambulatory Visit (HOSPITAL_BASED_OUTPATIENT_CLINIC_OR_DEPARTMENT_OTHER)
Admission: RE | Admit: 2019-06-08 | Discharge: 2019-06-08 | Disposition: A | Discharge status: Home / Self Care | Payer: Medicare Other | Source: Ambulatory Visit | Attending: Cardiology | Admitting: Cardiology

## 2019-06-08 DIAGNOSIS — R002 Palpitations: Secondary | ICD-10-CM | POA: Insufficient documentation

## 2019-06-08 DIAGNOSIS — R0609 Other forms of dyspnea: Secondary | ICD-10-CM | POA: Diagnosis not present

## 2019-06-08 NOTE — Progress Notes (Signed)
  Echocardiogram 2D Echocardiogram has been performed.   Robert Krause 06/08/2019, 2:32 PM

## 2019-06-09 ENCOUNTER — Telehealth: Payer: Self-pay

## 2019-06-09 ENCOUNTER — Telehealth (HOSPITAL_COMMUNITY): Payer: Self-pay | Admitting: *Deleted

## 2019-06-09 NOTE — Telephone Encounter (Signed)
Patient given detailed instructions per Myocardial Perfusion Study Information Sheet for the test on 06/15/19 Patient notified to arrive 15 minutes early and that it is imperative to arrive on time for appointment to keep from having the test rescheduled.  If you need to cancel or reschedule your appointment, please call the office within 24 hours of your appointment. . Patient verbalized understanding. Kirstie Peri

## 2019-06-09 NOTE — Telephone Encounter (Signed)
-----   Message from Jenean Lindau, MD sent at 06/08/2019  3:53 PM EDT ----- The results of the study is unremarkable. Please inform patient. I will discuss in detail at next appointment. Cc  primary care/referring physician Jenean Lindau, MD 06/08/2019 3:52 PM

## 2019-06-09 NOTE — Telephone Encounter (Signed)
Patient called and notified of test results. 

## 2019-06-15 ENCOUNTER — Ambulatory Visit (HOSPITAL_COMMUNITY): Payer: Medicare Other | Attending: Cardiovascular Disease

## 2019-06-15 ENCOUNTER — Other Ambulatory Visit: Payer: Self-pay

## 2019-06-15 VITALS — Ht 69.0 in | Wt 215.0 lb

## 2019-06-15 DIAGNOSIS — R0609 Other forms of dyspnea: Secondary | ICD-10-CM | POA: Diagnosis not present

## 2019-06-15 DIAGNOSIS — R002 Palpitations: Secondary | ICD-10-CM | POA: Insufficient documentation

## 2019-06-15 LAB — MYOCARDIAL PERFUSION IMAGING
LV dias vol: 76 mL (ref 62–150)
LV sys vol: 41 mL
Peak HR: 71 {beats}/min
Rest HR: 46 {beats}/min
SDS: 0
SRS: 0
SSS: 0
TID: 0.97

## 2019-06-15 MED ORDER — TECHNETIUM TC 99M TETROFOSMIN IV KIT
10.7000 | PACK | Freq: Once | INTRAVENOUS | Status: AC | PRN
  Administered 2019-06-15: 10.7 via INTRAVENOUS
  Filled 2019-06-15: qty 11

## 2019-06-15 MED ORDER — REGADENOSON 0.4 MG/5ML IV SOLN
0.4000 mg | Freq: Once | INTRAVENOUS | Status: AC
  Administered 2019-06-15: 0.4 mg via INTRAVENOUS

## 2019-06-15 MED ORDER — TECHNETIUM TC 99M TETROFOSMIN IV KIT
31.1000 | PACK | Freq: Once | INTRAVENOUS | Status: AC | PRN
  Administered 2019-06-15: 31.1 via INTRAVENOUS
  Filled 2019-06-15: qty 32

## 2019-06-17 ENCOUNTER — Other Ambulatory Visit: Payer: Self-pay | Admitting: Internal Medicine

## 2019-06-20 ENCOUNTER — Telehealth: Payer: Self-pay

## 2019-06-20 NOTE — Telephone Encounter (Signed)
-----   Message from Jenean Lindau, MD sent at 06/16/2019  8:11 AM EDT ----- The results of the study is unremarkable. Please inform patient. I will discuss in detail at next appointment. Cc  primary care/referring physician Jenean Lindau, MD 06/16/2019 8:11 AM

## 2019-06-20 NOTE — Telephone Encounter (Signed)
Patient called and notified of test results. 

## 2019-07-01 ENCOUNTER — Other Ambulatory Visit: Payer: Self-pay

## 2019-07-01 ENCOUNTER — Ambulatory Visit (INDEPENDENT_AMBULATORY_CARE_PROVIDER_SITE_OTHER): Payer: Medicare Other | Admitting: Cardiology

## 2019-07-01 ENCOUNTER — Encounter: Payer: Self-pay | Admitting: Cardiology

## 2019-07-01 VITALS — BP 140/72 | HR 60 | Ht 69.0 in | Wt 212.0 lb

## 2019-07-01 DIAGNOSIS — I10 Essential (primary) hypertension: Secondary | ICD-10-CM | POA: Diagnosis not present

## 2019-07-01 NOTE — Patient Instructions (Signed)
Medication Instructions:  Your physician recommends that you continue on your current medications as directed. Please refer to the Current Medication list given to you today.  If you need a refill on your cardiac medications before your next appointment, please call your pharmacy.   Lab work: NONE If you have labs (blood work) drawn today and your tests are completely normal, you will receive your results only by: Marland Kitchen MyChart Message (if you have MyChart) OR . A paper copy in the mail If you have any lab test that is abnormal or we need to change your treatment, we will call you to review the results.  Testing/Procedures: You had an EKG performed today.  Follow-Up: At [facility], you and your health needs are our priority.  As part of our continuing mission to provide you with exceptional heart care, we have created designated Provider Care Teams.  These Care Teams include your primary Cardiologist (physician) and Advanced Practice Providers (APPs -  Physician Assistants and Nurse Practitioners) who all work together to provide you with the care you need, when you need it. You will need a follow up appointment in 3 months.

## 2019-07-01 NOTE — Progress Notes (Signed)
Cardiology Office Note:    Date:  07/01/2019   ID:  Robert Krause, DOB 02/16/1934, MRN [id number]  PCP:  Colon Branch, MD  Cardiologist:  Jenean Lindau, MD   Referring MD: Colon Branch, MD    ASSESSMENT:    No diagnosis found. PLAN:    In order of problems listed above:  1. Essential hypertension: Blood pressure stable and diet was discussed 2. Mixed dyslipidemia: Lipids are followed by primary care physician. 3. Bradycardia: EKG done today reveals sinus rhythm and nonspecific ST-T changes.  We are awaiting report of the event monitor and will be in touch with the patient about this. 4. Patient will be seen in follow-up appointment in 3 months or earlier if the patient has any concerns    Medication Adjustments/Labs and Tests Ordered: Current medicines are reviewed at length with the patient today.  Concerns regarding medicines are outlined above.  No orders of the defined types were placed in this encounter.  No orders of the defined types were placed in this encounter.    No chief complaint on file.    History of Present Illness:    Robert Krause is a 85 y.o. male.  Patient was evaluated for chest discomfort and slow heart rate.  Echocardiogram and stress test results are within normal limits.  Patient mentions of only generalized fatigue.  No orthopnea or PND.  He had a 3-day monitor and he had we are awaiting reports.  At the time of my evaluation, the patient is alert awake oriented and in no distress.  Past Medical History:  Diagnosis Date  . Adenomatous polyp of colon 10/2005  . Allergic rhinitis   . Anxiety   . BPH (benign prostatic hyperplasia)   . Chronic interstitial cystitis    Dr Amalia Hailey  . Chronic sinusitis   . CTS (carpal tunnel syndrome) 01/27/2017  . Glaucoma   . Hyperlipidemia   . Hypertension   . Osteoarthritis   . Venous [place]    right inferior clavicle, shave    Past Surgical History:  Procedure Laterality Date  . APPENDECTOMY    .  CATARACT EXTRACTION Right 06-2015  . POLYPECTOMY    . TRANSURETHRAL RESECTION OF PROSTATE      Current Medications: Current Meds  Medication Sig  . ALPRAZolam (XANAX) 0.25 MG tablet Take 1 tablet (0.25 mg total) by mouth every 8 (eight) hours as needed for anxiety.  . irbesartan-hydrochlorothiazide (AVALIDE) 150-12.5 MG tablet Take 1 tablet by mouth daily.  . ondansetron (ZOFRAN) 4 MG tablet TAKE 1 TABLET(4 MG) BY MOUTH EVERY 8 HOURS AS NEEDED FOR NAUSEA OR VOMITING  . pantoprazole (PROTONIX) 40 MG tablet Take 1 tablet (40 mg total) by mouth 2 (two) times daily.  . pravastatin (PRAVACHOL) 10 MG tablet Take 1 tablet (10 mg total) by mouth daily.  . Tamsulosin HCl (FLOMAX) 0.4 MG CAPS Take 1 capsule by mouth Daily.  . timolol (TIMOPTIC) 0.5 % ophthalmic solution PLACE 1 DROP INTO BOTH EYES TWICE DAILY   Current Facility-Administered Medications for the 07/01/19 encounter (Office Visit) with , Reita Cliche, MD  Medication  . regadenoson (LEXISCAN) injection SOLN 0.4 mg     Allergies:   Patient has no known allergies.   Social History   Socioeconomic History  . Marital status: Married    Spouse name: Not on file  . Number of children: 1  . Years of education: Not on file  . Highest education level: Not on file  Occupational History  . Occupation: retiredCorporate treasurer: RETIRED  Social Needs  . Financial resource strain: Not on file  . Food insecurity    Worry: Not on file    Inability: Not on file  . Transportation needs    Medical: Not on file    Non-medical: Not on file  Tobacco Use  . Smoking status: Never Smoker  . Smokeless tobacco: Never Used  Substance and Sexual Activity  . Alcohol use: Yes    Alcohol/week: 3.0 standard drinks    Types: 3 Glasses of wine per week    Comment: wine  . Drug use: No  . Sexual activity: Not on file  Lifestyle  . Physical activity    Days per week: Not on file    Minutes per session: Not on file  . Stress: Not on file   Relationships  . Social Herbalist on phone: Not on file    Gets together: Not on file    Attends religious service: Not on file    Active member of club or organization: Not on file    Attends meetings of clubs or organizations: Not on file    Relationship status: Not on file  Other Topics Concern  . Not on file  Social History Narrative   Born in [place], raised in [place], moved to [place] in the 60s   Son lives in the [place]      Family History: The patient's family history includes Anxiety disorder in an other family member; CAD in his father; Hyperlipidemia in his father; Hypertension in his father. There is no history of Colon cancer, Prostate cancer, Esophageal cancer, Rectal cancer, or Stomach cancer.  ROS:   Please see the history of present illness.    All other systems reviewed and are negative.  EKGs/Labs/Other Studies Reviewed:    The following studies were reviewed today: Study Highlights    Nuclear stress EF: 45%.  No T wave inversion was noted during stress.  There was no ST segment deviation noted during stress.  The study is normal.  This is an intermediate risk study.   Normal perfusion. LVEF 45% with global hypokinesis. Ventricular bigeminy was noted during the study. This may cause falsely low LVEF due to gating abnormality. Echo correlation is recommended. This is an intermediate risk study (due to decreased LVEF).    1. The left ventricle has normal systolic function with an ejection fraction of 60-65%. The cavity size was normal. Left ventricular diastolic Doppler parameters are consistent with impaired relaxation.  2. The right ventricle has normal systolic function. The cavity was normal. There is no increase in right ventricular wall thickness.  3. The tricuspid valve is grossly normal.  4. The aortic valve is grossly normal. Aortic valve regurgitation was not assessed by color flow Doppler.    Recent Labs: 10/04/2018: ALT 16; TSH 2.38  05/26/2019: BUN 12; Creatinine, Ser 1.02; Hemoglobin 15.8; Magnesium 2.0; Platelets 194.0; Potassium 4.4; Sodium 137  Recent Lipid Panel    Component Value Date/Time   CHOL 141 11/08/2018 1033   TRIG 69.0 11/08/2018 1033   TRIG 55 10/05/2006 1439   HDL 44.00 11/08/2018 1033   CHOLHDL 3 11/08/2018 1033   VLDL 13.8 11/08/2018 1033   LDLCALC 84 11/08/2018 1033   LDLDIRECT 153.4 06/06/2013 1156    Physical Exam:    VS:  BP 140/72   Pulse (!) 36   Ht 5\' 9"  (1.753 m)   Wt  212 lb (96.2 kg)   SpO2 98%   BMI 31.31 kg/m     Wt Readings from Last 3 Encounters:  07/01/19 212 lb (96.2 kg)  06/15/19 215 lb (97.5 kg)  06/07/19 215 lb (97.5 kg)     GEN: Patient is in no acute distress HEENT: Normal NECK: No JVD; No carotid bruits LYMPHATICS: No lymphadenopathy CARDIAC: Hear sounds regular, 2/6 systolic murmur at the apex. RESPIRATORY:  Clear to auscultation without rales, wheezing or rhonchi  ABDOMEN: Soft, non-tender, non-distended MUSCULOSKELETAL:  No edema; No deformity  SKIN: Warm and dry NEUROLOGIC:  Alert and oriented x 3 PSYCHIATRIC:  Normal affect   Signed, Jenean Lindau, MD  07/01/2019 10:13 AM    [place]

## 2019-07-05 ENCOUNTER — Ambulatory Visit: Payer: Medicare Other | Admitting: Cardiology

## 2019-07-07 ENCOUNTER — Ambulatory Visit: Payer: Medicare Other | Admitting: Internal Medicine

## 2019-07-15 ENCOUNTER — Telehealth: Payer: Self-pay | Admitting: *Deleted

## 2019-07-15 NOTE — Telephone Encounter (Signed)
Spoke with pt again about zio. Robert Krause says that the monitor that was registered to his name came back to [employer] unopened. Pt says he wore the monitor and pushed button and saw green light flashing which means it is activated. Not sure what happened. If we want monitor results pt will have to wear again. Pt wanted to ask Dr. If the monitor is something he has to have. Please advise.

## 2019-07-15 NOTE — Telephone Encounter (Signed)
-----   Message from Beckey Rutter, RN sent at 07/01/2019 10:28 AM EDT ----- Regarding: please let me know Patient states he returned monitor 3 wks ago. When you get a moment can you see what you can find out about report?   Robert Krause

## 2019-07-15 NOTE — Telephone Encounter (Signed)
Spoke with pt about monitor. Pt stated that the monitor was mailed to him and that he had mailed it back. I let pt know that I would look into it and get back with him.

## 2019-07-18 NOTE — Telephone Encounter (Signed)
Yes, please make sure he does not get billed again

## 2019-07-21 ENCOUNTER — Other Ambulatory Visit: Payer: Self-pay | Admitting: Cardiology

## 2019-07-21 DIAGNOSIS — R002 Palpitations: Secondary | ICD-10-CM

## 2019-07-21 NOTE — Telephone Encounter (Signed)
Spoke with pt and he will come in tomorrow and will have monitor put on here at the [facility]. Pt will come back in on Monday and have monitor taken off and mailed in.

## 2019-07-22 ENCOUNTER — Other Ambulatory Visit: Payer: Self-pay | Admitting: Internal Medicine

## 2019-07-22 ENCOUNTER — Ambulatory Visit (INDEPENDENT_AMBULATORY_CARE_PROVIDER_SITE_OTHER): Payer: Medicare Other

## 2019-07-22 ENCOUNTER — Other Ambulatory Visit: Payer: Self-pay

## 2019-07-22 DIAGNOSIS — R002 Palpitations: Secondary | ICD-10-CM

## 2019-07-27 ENCOUNTER — Ambulatory Visit (INDEPENDENT_AMBULATORY_CARE_PROVIDER_SITE_OTHER): Payer: Medicare Other | Admitting: Internal Medicine

## 2019-07-27 ENCOUNTER — Encounter: Payer: Self-pay | Admitting: Internal Medicine

## 2019-07-27 ENCOUNTER — Other Ambulatory Visit: Payer: Self-pay

## 2019-07-27 VITALS — BP 134/70 | HR 53 | Temp 96.8°F | Resp 18 | Ht 69.0 in | Wt 211.4 lb

## 2019-07-27 DIAGNOSIS — Z23 Encounter for immunization: Secondary | ICD-10-CM

## 2019-07-27 DIAGNOSIS — I10 Essential (primary) hypertension: Secondary | ICD-10-CM | POA: Diagnosis not present

## 2019-07-27 DIAGNOSIS — F419 Anxiety disorder, unspecified: Secondary | ICD-10-CM

## 2019-07-27 NOTE — Progress Notes (Signed)
Pre visit review using our clinic review tool, if applicable. No additional management support is needed unless otherwise documented below in the visit note. 

## 2019-07-27 NOTE — Patient Instructions (Addendum)
Please schedule Medicare Wellness with Glenard Haring.     GO TO THE FRONT DESK Schedule your next appointment for a physical exam by December 2020

## 2019-07-27 NOTE — Assessment & Plan Note (Signed)
HTN: cont Avalide, controlled  Anxiety: See last visit, he tried Lexapro, had s/e, self d/c.  Currently doing okay. Arrhythmia, see last visit. Seen by cardiology 06/07/2019 dx was  DOE and palpitations Echocardiogram unremarkable, nuclear stress test EF 45%, no ischemic changes.  Long-term monitor was prescribed as well. Results pending Nausea: Postprandial, ongoing.  GB U/S 2017 (-),  saw GI, EGD 11-2018: Erosive gastritis, BX with no H. pylori, + reactive gastropathy.  Recent CBC showed no anemia.  On PPIs. Patient plans to contact GI, I agree Flu shot today RTC 10/2019 CPX

## 2019-07-27 NOTE — Progress Notes (Signed)
Subjective:    Patient ID: Robert Krause, male    DOB: 07/29/1934, 85 y.o.   MRN: [id number]  DOS:  07/27/2019 Type of visit - description: Follow-up Anxiety: He tried Lexapro briefly, had side effect, self stopped.  At this point his symptoms has decreased. Cardiology notes reviewed Chronic nausea, usually postprandial.  Saw GI before, notes reviewed    Review of Systems  Denies vomiting, abdominal pain per se or blood in the stools Past Medical History:  Diagnosis Date  . Adenomatous polyp of colon 10/2005  . Allergic rhinitis   . Anxiety   . BPH (benign prostatic hyperplasia)   . Chronic interstitial cystitis    Dr Amalia Hailey  . Chronic sinusitis   . CTS (carpal tunnel syndrome) 01/27/2017  . Glaucoma   . Hyperlipidemia   . Hypertension   . Osteoarthritis   . Venous lake    right inferior clavicle, shave    Past Surgical History:  Procedure Laterality Date  . APPENDECTOMY    . CATARACT EXTRACTION Right 06-2015  . POLYPECTOMY    . TRANSURETHRAL RESECTION OF PROSTATE      Social History   Socioeconomic History  . Marital status: Married    Spouse name: Not on file  . Number of children: 1  . Years of education: Not on file  . Highest education level: Not on file  Occupational History  . Occupation: retiredCorporate treasurer: RETIRED  Social Needs  . Financial resource strain: Not on file  . Food insecurity    Worry: Not on file    Inability: Not on file  . Transportation needs    Medical: Not on file    Non-medical: Not on file  Tobacco Use  . Smoking status: Never Smoker  . Smokeless tobacco: Never Used  Substance and Sexual Activity  . Alcohol use: Yes    Alcohol/week: 3.0 standard drinks    Types: 3 Glasses of wine per week    Comment: wine  . Drug use: No  . Sexual activity: Not on file  Lifestyle  . Physical activity    Days per week: Not on file    Minutes per session: Not on file  . Stress: Not on file  Relationships  . Social  Herbalist on phone: Not on file    Gets together: Not on file    Attends religious service: Not on file    Active member of club or organization: Not on file    Attends meetings of clubs or organizations: Not on file    Relationship status: Not on file  . Intimate partner violence    Fear of current or ex partner: Not on file    Emotionally abused: Not on file    Physically abused: Not on file    Forced sexual activity: Not on file  Other Topics Concern  . Not on file  Social History Narrative   Born in [place], raised in [place], moved to [place] in the 60s   Son lives in the [place] as of 07/27/2019   No Known Allergies     Medication List       Accurate as of July 27, 2019  8:53 AM. If you have any questions, ask your nurse or doctor.        ALPRAZolam 0.25 MG tablet Commonly known as: XANAX Take 1 tablet (0.25 mg total) by mouth every 8 (eight) hours  as needed for anxiety.   irbesartan-hydrochlorothiazide 150-12.5 MG tablet Commonly known as: AVALIDE Take 1 tablet by mouth daily.   ondansetron 4 MG tablet Commonly known as: ZOFRAN TAKE 1 TABLET(4 MG) BY MOUTH EVERY 8 HOURS AS NEEDED FOR NAUSEA OR VOMITING   pantoprazole 40 MG tablet Commonly known as: PROTONIX Take 1 tablet (40 mg total) by mouth 2 (two) times daily.   pravastatin 10 MG tablet Commonly known as: PRAVACHOL Take 1 tablet (10 mg total) by mouth daily.   tamsulosin 0.4 MG Caps capsule Commonly known as: FLOMAX Take 1 capsule by mouth Daily.   timolol 0.5 % ophthalmic solution Commonly known as: TIMOPTIC PLACE 1 DROP INTO BOTH EYES TWICE DAILY           Objective:   Physical Exam BP 134/70 (BP Location: Left Arm, Patient Position: Sitting, Cuff Size: Small)   Pulse (!) 53   Temp (!) 96.8 F (36 C) (Temporal)   Resp 18   Ht 5\' 9"  (1.753 m)   Wt 211 lb 6 oz (95.9 kg)   SpO2 100%   BMI 31.21 kg/m  General:   Well developed, NAD, BMI noted. HEENT:   Normocephalic . Face symmetric, atraumatic Lungs:  CTA B Normal respiratory effort, no intercostal retractions, no accessory muscle use. Heart: RRR,  no murmur.  No pretibial edema bilaterally  Abdomen: Soft, nontender, nondistended Skin: Not pale. Not jaundice Neurologic:  alert & oriented X3.  Speech normal, gait appropriate for age and unassisted Psych--  Cognition and judgment appear intact.  Cooperative with normal attention span and concentration.  Behavior appropriate. No anxious or depressed appearing.      Assessment    Assessment Prediabetes HTN CV: --Bradycardia: Holter 2016 essentially negative --06/07/2019 dx was  DOE and palpitations: ECHO unremarkable, nuclear stress test EF 45%, no ischemic changes Hyperlipidemia Anxiety:  -on xanax, stress related to interstitial cystitis - lexapro 05/2019 (s/e HA, constipation) Insomnia: On melatonin, not interested in other medications DJD CTS: B, offered surgery before  Obesity: BMI 32 Glaucoma GU: Dr. Amalia Hailey --BPH, TURP --Interstitial cystitis (on gabapentin prn) Venous lake, right inferior clavicle  PLAN HTN: cont Avalide, controlled  Anxiety: See last visit, he tried Lexapro, had s/e, self d/c.  Currently doing okay. Arrhythmia, see last visit. Seen by cardiology 06/07/2019 dx was  DOE and palpitations Echocardiogram unremarkable, nuclear stress test EF 45%, no ischemic changes.  Long-term monitor was prescribed as well. Results pending Nausea: Postprandial, ongoing.  GB U/S 2017 (-),  saw GI, EGD 11-2018: Erosive gastritis, BX with no H. pylori, + reactive gastropathy.  Recent CBC showed no anemia.  On PPIs. Patient plans to contact GI, I agree Flu shot today RTC 10/2019 CPX

## 2019-08-01 DIAGNOSIS — R002 Palpitations: Secondary | ICD-10-CM | POA: Diagnosis not present

## 2019-08-05 ENCOUNTER — Telehealth: Payer: Self-pay

## 2019-08-05 NOTE — Telephone Encounter (Signed)
Left message for patient to call office for results. Copy of results sent to Dr. Larose Kells per Dr. Docia Furl request.

## 2019-08-05 NOTE — Telephone Encounter (Signed)
Results relayed no further questions.

## 2019-08-05 NOTE — Telephone Encounter (Signed)
-----   Message from Jenean Lindau, MD sent at 08/03/2019  4:22 PM EDT ----- The results of the study is unremarkable. Please inform patient. I will discuss in detail at next appointment. Cc  primary care/referring physician Jenean Lindau, MD 08/03/2019 4:22 PM

## 2019-09-01 ENCOUNTER — Encounter: Payer: Self-pay | Admitting: Gastroenterology

## 2019-09-01 ENCOUNTER — Other Ambulatory Visit (INDEPENDENT_AMBULATORY_CARE_PROVIDER_SITE_OTHER): Payer: Medicare Other

## 2019-09-01 ENCOUNTER — Other Ambulatory Visit: Payer: Self-pay

## 2019-09-01 ENCOUNTER — Ambulatory Visit: Payer: Medicare Other | Admitting: Gastroenterology

## 2019-09-01 VITALS — BP 112/60 | HR 61 | Temp 97.9°F | Ht 70.0 in | Wt 208.0 lb

## 2019-09-01 DIAGNOSIS — R6881 Early satiety: Secondary | ICD-10-CM

## 2019-09-01 DIAGNOSIS — R110 Nausea: Secondary | ICD-10-CM

## 2019-09-01 LAB — COMPREHENSIVE METABOLIC PANEL
ALT: 15 U/L (ref 0–53)
AST: 17 U/L (ref 0–37)
Albumin: 3.7 g/dL (ref 3.5–5.2)
Alkaline Phosphatase: 59 U/L (ref 39–117)
BUN: 21 mg/dL (ref 6–23)
CO2: 29 mEq/L (ref 19–32)
Calcium: 9.2 mg/dL (ref 8.4–10.5)
Chloride: 103 mEq/L (ref 96–112)
Creatinine, Ser: 1 mg/dL (ref 0.40–1.50)
GFR: 71.39 mL/min (ref >60.00)
Glucose, Bld: 113 mg/dL — ABNORMAL HIGH (ref 70–99)
Potassium: 4.2 mEq/L (ref 3.5–5.1)
Sodium: 137 mEq/L (ref 135–145)
Total Bilirubin: 1 mg/dL (ref 0.2–1.2)
Total Protein: 7 g/dL (ref 6.0–8.3)

## 2019-09-01 LAB — CBC WITH DIFFERENTIAL/PLATELET
Basophils Absolute: 0.1 10*3/uL (ref 0.0–0.1)
Basophils Relative: 1.4 % (ref 0.0–3.0)
Eosinophils Absolute: 0.3 10*3/uL (ref 0.0–0.7)
Eosinophils Relative: 4.7 % (ref 0.0–5.0)
HCT: 43.5 % (ref 39.0–52.0)
Hemoglobin: 14.9 g/dL (ref 13.0–17.0)
Lymphocytes Relative: 26.7 % (ref 12.0–46.0)
Lymphs Abs: 1.6 10*3/uL (ref 0.7–4.0)
MCHC: 34.2 g/dL (ref 30.0–36.0)
MCV: 94.1 fl (ref 78.0–100.0)
Monocytes Absolute: 0.7 10*3/uL (ref 0.1–1.0)
Monocytes Relative: 11.9 % (ref 3.0–12.0)
Neutro Abs: 3.3 10*3/uL (ref 1.4–7.7)
Neutrophils Relative %: 55.3 % (ref 43.0–77.0)
Platelets: 194 10*3/uL (ref 150.0–400.0)
RBC: 4.62 Mil/uL (ref 4.22–5.81)
RDW: 13 % (ref 11.5–15.5)
WBC: 5.9 10*3/uL (ref 4.0–10.5)

## 2019-09-01 LAB — LIPASE: Lipase: 7 U/L — ABNORMAL LOW (ref 11.0–59.0)

## 2019-09-01 LAB — TSH: TSH: 0.03 u[IU]/mL — ABNORMAL LOW (ref 0.35–4.50)

## 2019-09-01 NOTE — Patient Instructions (Signed)
Your provider has requested that you go to the basement level for lab work before leaving today. Press "B" on the elevator. The lab is located at the first door on the left as you exit the elevator.  Start FD gard samples taking one capsule by mouth three times a day before meals.   You have been scheduled for a CT scan of the abdomen and pelvis at [facility] ([address]---this is in the same building as [facility]).   You are scheduled on 09/05/19  at 11:00am. You should arrive 15 minutes prior to your appointment time for registration. Please follow the written instructions below on the day of your exam:  WARNING: IF YOU ARE ALLERGIC TO IODINE/X-RAY DYE, PLEASE NOTIFY RADIOLOGY IMMEDIATELY AT [phone]! YOU WILL BE GIVEN A 13 HOUR PREMEDICATION PREP.  1) Do not eat or drink anything after 7:00am (4 hours prior to your test) 2) You have been given 2 bottles of oral contrast to drink. The solution may taste better if refrigerated, but do NOT add ice or any other liquid to this solution. Shake well before drinking.    Drink 1 bottle of contrast @ 9:00am (2 hours prior to your exam)  Drink 1 bottle of contrast @ 10:00am (1 hour prior to your exam)  You may take any medications as prescribed with a small amount of water, if necessary. If you take any of the following medications: METFORMIN, GLUCOPHAGE, GLUCOVANCE, AVANDAMET, RIOMET, FORTAMET, [place] MET, JANUMET, GLUMETZA or METAGLIP, you MAY be asked to HOLD this medication 48 hours AFTER the exam.  The purpose of you drinking the oral contrast is to aid in the visualization of your intestinal tract. The contrast solution may cause some diarrhea. Depending on your individual set of symptoms, you may also receive an intravenous injection of x-ray contrast/dye. Plan on being at [facility] for 30 minutes or longer, depending on the type of exam you are having performed.  This test typically takes 30-45  minutes to complete.  If you have any questions regarding your exam or if you need to reschedule, you may call the CT department at [phone] between the hours of 8:00 am and 5:00 pm, Monday-Friday.  __________________________________________________________________  Thank you for choosing me and [facility].  Pricilla Riffle. Dagoberto Ligas., MD., [facility]

## 2019-09-01 NOTE — Progress Notes (Signed)
    History of Present Illness: This is a 85 year old male with constant nausea, decreased appetite, weight down 9 lbs in 1 year.  He relates that his nausea is fairly constant throughout the day for the past several months.  It does not change with meals.  He states when he is busy and active he does not note nausea but when he is less active he feels nauseated.  He is concerned about weight loss and decreased appetite.  He notes mild intermittent constipation that clears with the regular use of Metamucil.  Denies abdominal pain, diarrhea, change in stool caliber, melena, hematochezia, vomiting, dysphagia, reflux symptoms, chest pain.   EGD 11/2018 - Normal esophagus. - Non-bleeding erosive gastropathy. Biopsied. Reactive gastropathy - Normal duodenal bulb and second portion of the duodenum.  Colonoscopy 12/2017  One 5 mm polyp in the transverse colon, removed with a cold biopsy forceps. Resected and retrieved. - Three 6 to 8 mm polyps in the sigmoid colon, in the descending colon and in the cecum, removed with a cold snare. Resected and retrieved. - Mild diverticulosis in the left colon. There was no evidence of diverticular bleeding. - The examination was otherwise normal on direct and retroflexion views. Tubular adenoma  Current Medications, Allergies, Past Medical History, Past Surgical History, Family History and Social History were reviewed in [employer] record.   Physical Exam: General: Well developed, well nourished, no acute distress Head: Normocephalic and atraumatic Eyes:  sclerae anicteric, EOMI Ears: Normal auditory acuity Mouth: No deformity or lesions Lungs: Clear throughout to auscultation Heart: Regular rate and rhythm; no murmurs, rubs or bruits Abdomen: Soft, non tender and non distended. No masses, hepatosplenomegaly or hernias noted. Normal Bowel sounds Rectal:  Not done  Musculoskeletal: Symmetrical with no gross deformities  Pulses:   Normal pulses noted Extremities: No clubbing, cyanosis, edema or deformities noted Neurological: Alert oriented x 4, grossly nonfocal Psychological:  Alert and cooperative. Normal mood and affect   Assessment and Recommendations:  1. Nausea, decreased appetite, weight loss. History of erosive gastropathy. CMP, CBC, TSH, lipase, Schedule CT AP. Begin FDgard 2 po tid ac. Continue pantoprazole 40 mg po qd and Zofran 4 m po tid prn. REV in 1 month.

## 2019-09-05 ENCOUNTER — Other Ambulatory Visit: Payer: Self-pay

## 2019-09-05 ENCOUNTER — Ambulatory Visit (INDEPENDENT_AMBULATORY_CARE_PROVIDER_SITE_OTHER)
Admission: RE | Admit: 2019-09-05 | Discharge: 2019-09-05 | Disposition: A | Discharge status: Home / Self Care | Payer: Medicare Other | Source: Ambulatory Visit | Attending: Gastroenterology | Admitting: Gastroenterology

## 2019-09-05 ENCOUNTER — Telehealth: Payer: Self-pay | Admitting: Gastroenterology

## 2019-09-05 DIAGNOSIS — R6881 Early satiety: Secondary | ICD-10-CM | POA: Diagnosis not present

## 2019-09-05 DIAGNOSIS — R634 Abnormal weight loss: Secondary | ICD-10-CM | POA: Diagnosis not present

## 2019-09-05 DIAGNOSIS — R110 Nausea: Secondary | ICD-10-CM | POA: Diagnosis not present

## 2019-09-05 DIAGNOSIS — K5730 Diverticulosis of large intestine without perforation or abscess without bleeding: Secondary | ICD-10-CM | POA: Diagnosis not present

## 2019-09-05 MED ORDER — IOHEXOL 300 MG/ML  SOLN
100.0000 mL | Freq: Once | INTRAMUSCULAR | Status: AC | PRN
  Administered 2019-09-05: 100 mL via INTRAVENOUS

## 2019-09-06 NOTE — Telephone Encounter (Signed)
See results notes for details.  

## 2019-09-08 DIAGNOSIS — N3010 Interstitial cystitis (chronic) without hematuria: Secondary | ICD-10-CM | POA: Diagnosis not present

## 2019-10-06 ENCOUNTER — Other Ambulatory Visit: Payer: Self-pay

## 2019-10-06 ENCOUNTER — Ambulatory Visit: Payer: Medicare Other | Admitting: Gastroenterology

## 2019-10-06 ENCOUNTER — Encounter: Payer: Self-pay | Admitting: Gastroenterology

## 2019-10-06 VITALS — BP 120/80 | HR 68 | Temp 98.2°F | Ht 69.0 in | Wt 207.6 lb

## 2019-10-06 DIAGNOSIS — R110 Nausea: Secondary | ICD-10-CM | POA: Diagnosis not present

## 2019-10-06 MED ORDER — METOCLOPRAMIDE HCL 5 MG PO TABS: 5.0000 mg | ORAL_TABLET | Freq: Three times a day (TID) | ORAL | 1 refills | Status: DC

## 2019-10-06 NOTE — Progress Notes (Signed)
    History of Present Illness: This is an 85 year old male with persistent nausea.  He now notes that nausea is brought on by meals and is generally worse in the mornings.  Previously he had not noted meal related nausea.  No other gastrointestinal complaints.  He relates significant fatigue.  We reviewed blood work and CT scan recently performed in detail.  Current Medications, Allergies, Past Medical History, Past Surgical History, Family History and Social History were reviewed in [employer] record.   Physical Exam: General: Well developed, well nourished, no acute distress Head: Normocephalic and atraumatic Eyes:  sclerae anicteric, EOMI Ears: Normal auditory acuity Mouth: No deformity or lesions Lungs: Clear throughout to auscultation Heart: Regular rate and rhythm; no murmurs, rubs or bruits Abdomen: Soft, non tender and non distended. No masses, hepatosplenomegaly or hernias noted. Normal Bowel sounds Rectal: Not done Musculoskeletal: Symmetrical with no gross deformities  Pulses:  Normal pulses noted Extremities: No clubbing, cyanosis, edema or deformities noted Neurological: Alert oriented x 4, grossly nonfocal Psychological:  Alert and cooperative. Anxious   Assessment and Recommendations:  1. Nausea described as post prandial and worse in the morning.  Etiology unclear.  Continue pantoprazole 40 mg po qam. Start Reglan 5 mg po tid, taken 30 minutes ac. Call in 2 weeks and if symptoms not improved will proceed with EGD and head CT. Schedule appt with Dr. Larose Kells regarding low TSH, fatigue, nausea. REV in 6 weeks.

## 2019-10-06 NOTE — Patient Instructions (Signed)
We have sent the following medications to your pharmacy for you to pick up at your convenience: Reglan  Call our office in 2 weeks if you have no improvement in your nausea  Please contact your PCR  Dr Larose Kells for an appointment  It was a pleasure to see you today!   Dr Percell Belt

## 2019-10-10 ENCOUNTER — Ambulatory Visit (INDEPENDENT_AMBULATORY_CARE_PROVIDER_SITE_OTHER): Payer: Medicare Other | Admitting: Cardiology

## 2019-10-10 ENCOUNTER — Ambulatory Visit (INDEPENDENT_AMBULATORY_CARE_PROVIDER_SITE_OTHER): Payer: Medicare Other | Admitting: Internal Medicine

## 2019-10-10 ENCOUNTER — Other Ambulatory Visit: Payer: Self-pay

## 2019-10-10 ENCOUNTER — Encounter: Payer: Self-pay | Admitting: Internal Medicine

## 2019-10-10 ENCOUNTER — Encounter: Payer: Self-pay | Admitting: Cardiology

## 2019-10-10 VITALS — BP 148/68 | HR 60 | Wt 207.0 lb

## 2019-10-10 VITALS — BP 121/85 | HR 56 | Temp 97.4°F | Resp 16 | Ht 69.0 in | Wt 209.1 lb

## 2019-10-10 DIAGNOSIS — R739 Hyperglycemia, unspecified: Secondary | ICD-10-CM

## 2019-10-10 DIAGNOSIS — R7989 Other specified abnormal findings of blood chemistry: Secondary | ICD-10-CM

## 2019-10-10 DIAGNOSIS — I10 Essential (primary) hypertension: Secondary | ICD-10-CM

## 2019-10-10 LAB — HEMOGLOBIN A1C: Hgb A1c MFr Bld: 5.6 % (ref 4.6–6.5)

## 2019-10-10 LAB — T4, FREE: Free T4: 0.91 ng/dL (ref 0.60–1.60)

## 2019-10-10 LAB — T3, FREE: T3, Free: 2.6 pg/mL (ref 2.3–4.2)

## 2019-10-10 LAB — TSH: TSH: 1.47 u[IU]/mL (ref 0.35–4.50)

## 2019-10-10 NOTE — Patient Instructions (Addendum)
Please schedule Medicare Wellness with Glenard Haring.   GO TO THE LAB : Get the blood work

## 2019-10-10 NOTE — Patient Instructions (Signed)

## 2019-10-10 NOTE — Progress Notes (Signed)
Cardiology Office Note:    Date:  10/10/2019   ID:  Robert Krause, DOB 1934-01-18, MRN [id number]  PCP:  Colon Branch, MD  Cardiologist:  Jenean Lindau, MD   Referring MD: Colon Branch, MD    ASSESSMENT:    1. Essential hypertension    PLAN:    In order of problems listed above:  1. Chest discomfort: Patient symptoms have resolved and he is happy with it.  He is walking some on a regular basis. 2. Essential hypertension: He has an element of whitecoat hypertension.  He described blood pressure readings at home and they are unremarkable. 3. Patient plans to go to his primary care in the next few days for complete annual physical and get blood work.  I mentioned to him the results of the aforementioned tests and he was satisfied with questions that he had. 4. Patient will be seen in follow-up appointment in 6 months or earlier if the patient has any concerns    Medication Adjustments/Labs and Tests Ordered: Current medicines are reviewed at length with the patient today.  Concerns regarding medicines are outlined above.  No orders of the defined types were placed in this encounter.  No orders of the defined types were placed in this encounter.    No chief complaint on file.    History of Present Illness:    Robert Krause is a 85 y.o. male.  Patient has past medical history of essential hypertension.  He was evaluated for chest discomfort and palpitations.  His echocardiogram stress test and monitoring was unremarkable.  He denies any problems at this time and takes care of activities of daily living.  No chest pain orthopnea or PND.  He is having gastrointestinal issues and is being evaluated by his doctor.  At the time of my evaluation, the patient is alert awake oriented and in no distress.  Past Medical History:  Diagnosis Date  . Adenomatous polyp of colon 10/2005  . Allergic rhinitis   . Anxiety   . BPH (benign prostatic hyperplasia)   . Chronic  interstitial cystitis    Dr Amalia Hailey  . Chronic sinusitis   . CTS (carpal tunnel syndrome) 01/27/2017  . Glaucoma   . Hyperlipidemia   . Hypertension   . Osteoarthritis   . [place]    right inferior clavicle, shave    Past Surgical History:  Procedure Laterality Date  . APPENDECTOMY    . CATARACT EXTRACTION Right 06-2015  . POLYPECTOMY    . TRANSURETHRAL RESECTION OF PROSTATE      Current Medications: Current Meds  Medication Sig  . irbesartan-hydrochlorothiazide (AVALIDE) 150-12.5 MG tablet Take 1 tablet by mouth daily.  . metoCLOPramide (REGLAN) 5 MG tablet Take 1 tablet (5 mg total) by mouth 3 (three) times daily before meals.  . ondansetron (ZOFRAN) 4 MG tablet TAKE 1 TABLET(4 MG) BY MOUTH EVERY 8 HOURS AS NEEDED FOR NAUSEA OR VOMITING  . pantoprazole (PROTONIX) 40 MG tablet Take 1 tablet (40 mg total) by mouth 2 (two) times daily.  . pravastatin (PRAVACHOL) 10 MG tablet Take 1 tablet (10 mg total) by mouth daily.  . Tamsulosin HCl (FLOMAX) 0.4 MG CAPS Take 1 capsule by mouth Daily.  . timolol (TIMOPTIC) 0.5 % ophthalmic solution PLACE 1 DROP INTO BOTH EYES TWICE DAILY  . [DISCONTINUED] ALPRAZolam (XANAX) 0.25 MG tablet Take 1 tablet (0.25 mg total) by mouth every 8 (eight) hours as needed for anxiety.     Allergies:  Patient has no known allergies.   Social History   Socioeconomic History  . Marital status: Married    Spouse name: Not on file  . Number of children: 1  . Years of education: Not on file  . Highest education level: Not on file  Occupational History  . Occupation: retiredCorporate treasurer: RETIRED  Social Needs  . Financial resource strain: Not on file  . Food insecurity    Worry: Not on file    Inability: Not on file  . Transportation needs    Medical: Not on file    Non-medical: Not on file  Tobacco Use  . Smoking status: Never Smoker  . Smokeless tobacco: Never Used  Substance and Sexual Activity  . Alcohol use: Yes    Alcohol/week: 3.0  standard drinks    Types: 3 Glasses of wine per week    Comment: wine  . Drug use: No  . Sexual activity: Not on file  Lifestyle  . Physical activity    Days per week: Not on file    Minutes per session: Not on file  . Stress: Not on file  Relationships  . Social Herbalist on phone: Not on file    Gets together: Not on file    Attends religious service: Not on file    Active member of club or organization: Not on file    Attends meetings of clubs or organizations: Not on file    Relationship status: Not on file  Other Topics Concern  . Not on file  Social History Narrative   Born in [place], raised in [place], moved to [place] in the 60s   Son lives in the [place]      Family History: The patient's family history includes Anxiety disorder in an other family member; CAD in his father; Hyperlipidemia in his father; Hypertension in his father. There is no history of Colon cancer, Prostate cancer, Esophageal cancer, Rectal cancer, or Stomach cancer.  ROS:   Please see the history of present illness.    All other systems reviewed and are negative.  EKGs/Labs/Other Studies Reviewed:    The following studies were reviewed today: I reviewed results of stress test echocardiogram and monitor with the patient at length.   Recent Labs: 05/26/2019: Magnesium 2.0 09/01/2019: ALT 15; BUN 21; Creatinine, Ser 1.00; Hemoglobin 14.9; Platelets 194.0; Potassium 4.2; Sodium 137; TSH 0.03  Recent Lipid Panel    Component Value Date/Time   CHOL 141 11/08/2018 1033   TRIG 69.0 11/08/2018 1033   TRIG 55 10/05/2006 1439   HDL 44.00 11/08/2018 1033   CHOLHDL 3 11/08/2018 1033   VLDL 13.8 11/08/2018 1033   LDLCALC 84 11/08/2018 1033   LDLDIRECT 153.4 06/06/2013 1156    Physical Exam:    VS:  BP (!) 148/68 (BP Location: Left Arm, Patient Position: Sitting, Cuff Size: Normal)   Pulse 60   Wt 207 lb (93.9 kg)   SpO2 99%   BMI 30.57 kg/m     Wt Readings from Last 3 Encounters:  10/10/19  207 lb (93.9 kg)  10/06/19 207 lb 9.6 oz (94.2 kg)  09/01/19 208 lb (94.3 kg)     GEN: Patient is in no acute distress HEENT: Normal NECK: No JVD; No carotid bruits LYMPHATICS: No lymphadenopathy CARDIAC: Hear sounds regular, 2/6 systolic murmur at the apex. RESPIRATORY:  Clear to auscultation without rales, wheezing or rhonchi  ABDOMEN: Soft, non-tender, non-distended MUSCULOSKELETAL:  No edema; No  deformity  SKIN: Warm and dry NEUROLOGIC:  Alert and oriented x 3 PSYCHIATRIC:  Normal affect   Signed, Jenean Lindau, MD  10/10/2019 11:37 AM    [facility]

## 2019-10-10 NOTE — Progress Notes (Signed)
Subjective:    Patient ID: Robert Krause, male    DOB: Feb 26, 1934, 85 y.o.   MRN: [id number]  DOS:  10/10/2019 Type of visit - description: Acute Patient is concerned about his latest labs. TSH was suppressed, blood sugar slightly elevated. He continue with nausea, follow-up by GI.   Wt Readings from Last 3 Encounters:  10/10/19 209 lb 2 oz (94.9 kg)  10/10/19 207 lb (93.9 kg)  10/06/19 207 lb 9.6 oz (94.2 kg)      Review of Systems Denies fever chills No diarrhea Reports some weight loss, he thinks his appetite is not the best and he is not eating properly. Denies depression. No palpitations  Past Medical History:  Diagnosis Date  . Adenomatous polyp of colon 10/2005  . Allergic rhinitis   . Anxiety   . BPH (benign prostatic hyperplasia)   . Chronic interstitial cystitis    Dr Amalia Hailey  . Chronic sinusitis   . CTS (carpal tunnel syndrome) 01/27/2017  . Glaucoma   . Hyperlipidemia   . Hypertension   . Osteoarthritis   . Venous [place]    right inferior clavicle, shave    Past Surgical History:  Procedure Laterality Date  . APPENDECTOMY    . CATARACT EXTRACTION Right 06-2015  . POLYPECTOMY    . TRANSURETHRAL RESECTION OF PROSTATE      Social History   Socioeconomic History  . Marital status: Married    Spouse name: Not on file  . Number of children: 1  . Years of education: Not on file  . Highest education level: Not on file  Occupational History  . Occupation: retiredCorporate treasurer: RETIRED  Social Needs  . Financial resource strain: Not on file  . Food insecurity    Worry: Not on file    Inability: Not on file  . Transportation needs    Medical: Not on file    Non-medical: Not on file  Tobacco Use  . Smoking status: Never Smoker  . Smokeless tobacco: Never Used  Substance and Sexual Activity  . Alcohol use: Yes    Alcohol/week: 3.0 standard drinks    Types: 3 Glasses of wine per week    Comment: wine  . Drug use: No  . Sexual  activity: Not on file  Lifestyle  . Physical activity    Days per week: Not on file    Minutes per session: Not on file  . Stress: Not on file  Relationships  . Social Herbalist on phone: Not on file    Gets together: Not on file    Attends religious service: Not on file    Active member of club or organization: Not on file    Attends meetings of clubs or organizations: Not on file    Relationship status: Not on file  . Intimate partner violence    Fear of current or ex partner: Not on file    Emotionally abused: Not on file    Physically abused: Not on file    Forced sexual activity: Not on file  Other Topics Concern  . Not on file  Social History Narrative   Born in [place], raised in [place], moved to [place] in the 60s   Son lives in the [place] as of 10/10/2019   No Known Allergies     Medication List       Accurate as of October 10, 2019 11:59 PM.  If you have any questions, ask your nurse or doctor.        STOP taking these medications   ALPRAZolam 0.25 MG tablet Commonly known as: Duanne Moron Stopped by: Jenean Lindau, MD     TAKE these medications   irbesartan-hydrochlorothiazide 150-12.5 MG tablet Commonly known as: AVALIDE Take 1 tablet by mouth daily.   metoCLOPramide 5 MG tablet Commonly known as: Reglan Take 1 tablet (5 mg total) by mouth 3 (three) times daily before meals.   ondansetron 4 MG tablet Commonly known as: ZOFRAN TAKE 1 TABLET(4 MG) BY MOUTH EVERY 8 HOURS AS NEEDED FOR NAUSEA OR VOMITING   pantoprazole 40 MG tablet Commonly known as: PROTONIX Take 1 tablet (40 mg total) by mouth 2 (two) times daily.   pravastatin 10 MG tablet Commonly known as: PRAVACHOL Take 1 tablet (10 mg total) by mouth daily.   tamsulosin 0.4 MG Caps capsule Commonly known as: FLOMAX Take 1 capsule by mouth Daily.   timolol 0.5 % ophthalmic solution Commonly known as: TIMOPTIC PLACE 1 DROP INTO BOTH EYES TWICE DAILY            Objective:   Physical Exam BP 121/85 (BP Location: Left Arm, Patient Position: Sitting, Cuff Size: Normal)   Pulse (!) 56   Temp (!) 97.4 F (36.3 C) (Temporal)   Resp 16   Ht 5\' 9"  (1.753 m)   Wt 209 lb 2 oz (94.9 kg)   SpO2 100%   BMI 30.88 kg/m  General:   Well developed, NAD, BMI noted. HEENT:  Normocephalic . Face symmetric, atraumatic Neck: No thyromegaly Lungs:  CTA B Normal respiratory effort, no intercostal retractions, no accessory muscle use. Heart: RRR,  no murmur.  No pretibial edema bilaterally  Skin: Not pale. Not jaundice Neurologic:  alert & oriented X3.  Speech normal, gait appropriate for age and unassisted.  No tremors Psych--  Cognition and judgment appear intact.  Cooperative with normal attention span and concentration.  Behavior appropriate. No anxious or depressed appearing.      Assessment     Assessment Prediabetes HTN CV: --Bradycardia: Holter 2016 essentially negative --06/07/2019 dx was  DOE and palpitations: ECHO unremarkable, nuclear stress test EF 45%, no ischemic changes Hyperlipidemia Anxiety:  -on xanax, stress related to interstitial cystitis - lexapro 05/2019 (s/e HA, constipation) Insomnia: On melatonin, not interested in other medications DJD CTS: B, offered surgery before  Obesity: BMI 32 Glaucoma GU: Dr. Amalia Hailey --BPH, TURP --Interstitial cystitis (on gabapentin prn) Venous lake, right inferior clavicle  PLAN Suppressed TSH: Recent TSH low, check TFTs.  No overt hyperthyroid sxs, did report some weight loss.  On chart review weight December 2019 was 219 pounds, today 210 pounds. Hyperglycemia: Check A1c Nausea: Follow-up by GI RTC scheduled already for December

## 2019-10-10 NOTE — Progress Notes (Signed)
Pre visit review using our clinic review tool, if applicable. No additional management support is needed unless otherwise documented below in the visit note. 

## 2019-10-12 NOTE — Assessment & Plan Note (Signed)
Suppressed TSH: Recent TSH low, check TFTs.  No overt hyperthyroid sxs, did report some weight loss.  On chart review weight December 2019 was 219 pounds, today 210 pounds. Hyperglycemia: Check A1c Nausea: Follow-up by GI RTC scheduled already for December

## 2019-10-18 ENCOUNTER — Telehealth: Payer: Self-pay | Admitting: Gastroenterology

## 2019-10-18 NOTE — Telephone Encounter (Signed)
Left message on machine to call back  

## 2019-10-25 NOTE — Telephone Encounter (Signed)
Left message for patient to call back  

## 2019-10-26 NOTE — Telephone Encounter (Signed)
Patient reports that she has continued nausea.  Per last office note on 10/12/19 needs EGD if no improvement on reglan.  Patient has been scheduled for EGD and COVID screen.  He will come see me tomorrow to sign his paperwork.

## 2019-10-28 ENCOUNTER — Other Ambulatory Visit: Payer: Self-pay | Admitting: Gastroenterology

## 2019-10-28 ENCOUNTER — Ambulatory Visit (INDEPENDENT_AMBULATORY_CARE_PROVIDER_SITE_OTHER): Payer: Medicare Other

## 2019-10-28 DIAGNOSIS — Z1159 Encounter for screening for other viral diseases: Secondary | ICD-10-CM

## 2019-10-28 LAB — SARS CORONAVIRUS 2 (TAT 6-24 HRS): SARS Coronavirus 2: NEGATIVE

## 2019-11-01 ENCOUNTER — Encounter: Payer: Self-pay | Admitting: Gastroenterology

## 2019-11-01 ENCOUNTER — Other Ambulatory Visit: Payer: Self-pay

## 2019-11-01 ENCOUNTER — Ambulatory Visit (AMBULATORY_SURGERY_CENTER): Payer: Medicare Other | Admitting: Gastroenterology

## 2019-11-01 VITALS — BP 108/64 | HR 45 | Temp 97.9°F | Resp 18 | Ht 69.0 in | Wt 207.0 lb

## 2019-11-01 DIAGNOSIS — R110 Nausea: Secondary | ICD-10-CM

## 2019-11-01 DIAGNOSIS — K3189 Other diseases of stomach and duodenum: Secondary | ICD-10-CM | POA: Diagnosis not present

## 2019-11-01 DIAGNOSIS — R634 Abnormal weight loss: Secondary | ICD-10-CM

## 2019-11-01 DIAGNOSIS — R6881 Early satiety: Secondary | ICD-10-CM

## 2019-11-01 MED ORDER — SODIUM CHLORIDE 0.9 % IV SOLN: 500.0000 mL | Freq: Once | INTRAVENOUS | Status: DC

## 2019-11-01 NOTE — Op Note (Signed)
[facility] Patient Name: Robert Krause Procedure Date: 11/01/2019 10:07 AM MRN: [id number] Endoscopist: Ladene Artist , MD Age: 85 Referring MD:  Date of Birth: 1934-04-22 Gender: Male Account #: [id number] Procedure:                Upper GI endoscopy Indications:              Early satiety, Nausea, Weight loss Medicines:                Monitored Anesthesia Care Procedure:                Pre-Anesthesia Assessment:                           - Prior to the procedure, a History and Physical                            was performed, and patient medications and                            allergies were reviewed. The patient's tolerance of                            previous anesthesia was also reviewed. The risks                            and benefits of the procedure and the sedation                            options and risks were discussed with the patient.                            All questions were answered, and informed consent                            was obtained. Prior Anticoagulants: The patient has                            taken no previous anticoagulant or antiplatelet                            agents. ASA Grade Assessment: II - A patient with                            mild systemic disease. After reviewing the risks                            and benefits, the patient was deemed in                            satisfactory condition to undergo the procedure.                           After obtaining informed consent, the endoscope was  passed under direct vision. Throughout the                            procedure, the patient's blood pressure, pulse, and                            oxygen saturations were monitored continuously. The                            Endoscope was introduced through the mouth, and                            advanced to the second part of duodenum. The upper                            GI endoscopy was  accomplished without difficulty.                            The patient tolerated the procedure well. Scope In: Scope Out: Findings:                 The examined esophagus was normal.                           A few localized small erosions with no bleeding and                            no stigmata of recent bleeding were found in the                            gastric antrum. Biopsies were taken in the antrum                            and body with a cold forceps for histology.                           The exam of the stomach was otherwise normal.                           The duodenal bulb and second portion of the                            duodenum were normal. Complications:            No immediate complications. Estimated Blood Loss:     Estimated blood loss was minimal. Impression:               - Normal esophagus.                           - Erosive gastropathy with no bleeding and no                            stigmata of recent bleeding. Biopsied.                           -  Normal duodenal bulb and second portion of the                            duodenum. Recommendation:           - Patient has a contact number available for                            emergencies. The signs and symptoms of potential                            delayed complications were discussed with the                            patient. Return to normal activities tomorrow.                            Written discharge instructions were provided to the                            patient.                           - Resume previous diet.                           - Continue present medications.                           - Await pathology results. Ladene Artist, MD 11/01/2019 10:21:47 AM This report has been signed electronically.

## 2019-11-01 NOTE — Progress Notes (Signed)
PT taken to PACU. Monitors in place. VSS. Report given to RN. 

## 2019-11-01 NOTE — Progress Notes (Signed)
[facility]

## 2019-11-01 NOTE — Patient Instructions (Signed)
Thank you for allowing [place] to care for you today!  Biopsy results should be final 1-2 weeks.  We will contact you with the results.  Resume your previous diet and medications today.  Return to your normal activities tomorrow.    YOU HAD AN ENDOSCOPIC PROCEDURE TODAY AT [facility]:   Refer to the procedure report that was given to you for any specific questions about what was found during the examination.  If the procedure report does not answer your questions, please call your gastroenterologist to clarify.  If you requested that your care partner not be given the details of your procedure findings, then the procedure report has been included in a sealed envelope for you to review at your convenience later.  YOU SHOULD EXPECT: Some feelings of bloating in the abdomen. Passage of more gas than usual.  Walking can help get rid of the air that was put into your GI tract during the procedure and reduce the bloating. If you had a lower endoscopy (such as a colonoscopy or flexible sigmoidoscopy) you may notice spotting of blood in your stool or on the toilet paper. If you underwent a bowel prep for your procedure, you may not have a normal bowel movement for a few days.  Please Note:  You might notice some irritation and congestion in your nose or some drainage.  This is from the oxygen used during your procedure.  There is no need for concern and it should clear up in a day or so.  SYMPTOMS TO REPORT IMMEDIATELY:    Following upper endoscopy (EGD)  Vomiting of blood or coffee ground material  New chest pain or pain under the shoulder blades  Painful or persistently difficult swallowing  New shortness of breath  Fever of 100F or higher  Black, tarry-looking stools  For urgent or emergent issues, a gastroenterologist can be reached at any hour by calling [phone].   DIET:  We do recommend a small meal at first, but then you may proceed to your regular diet.  Drink  plenty of fluids but you should avoid alcoholic beverages for 24 hours.  ACTIVITY:  You should plan to take it easy for the rest of today and you should NOT DRIVE or use heavy machinery until tomorrow (because of the sedation medicines used during the test).    FOLLOW UP: Our staff will call the number listed on your records 48-72 hours following your procedure to check on you and address any questions or concerns that you may have regarding the information given to you following your procedure. If we do not reach you, we will leave a message.  We will attempt to reach you two times.  During this call, we will ask if you have developed any symptoms of COVID 19. If you develop any symptoms (ie: fever, flu-like symptoms, shortness of breath, cough etc.) before then, please call [phone].  If you test positive for Covid 19 in the 2 weeks post procedure, please call and report this information to [place].    If any biopsies were taken you will be contacted by phone or by letter within the next 1-3 weeks.  Please call us at [phone] if you have not heard about the biopsies in 3 weeks.    SIGNATURES/CONFIDENTIALITY: You and/or your care partner have signed paperwork which will be entered into your electronic medical record.  These signatures attest to the fact that that the information above on your After Visit Summary  has been reviewed and is understood.  Full responsibility of the confidentiality of this discharge information lies with you and/or your care-partner. 

## 2019-11-01 NOTE — Progress Notes (Signed)
Called to room to assist during endoscopic procedure.  Patient ID and intended procedure confirmed with present staff. Received instructions for my participation in the procedure from the performing physician.  

## 2019-11-03 ENCOUNTER — Telehealth: Payer: Self-pay

## 2019-11-03 NOTE — Telephone Encounter (Signed)
  Follow up Call-  Call back number 11/01/2019 11/29/2018 01/05/2018  Post procedure Call Back phone  # [phone]  Permission to leave phone message Yes Yes Yes  Some recent data might be hidden     Patient questions:  Do you have a fever, pain , or abdominal swelling? No. Pain Score  0 *  Have you tolerated food without any problems? Yes.    Have you been able to return to your normal activities? Yes.    Do you have any questions about your discharge instructions: Diet   No. Medications  No. Follow up visit  No.  Do you have questions or concerns about your Care? No.  Actions: * If pain score is 4 or above: No action needed, pain <4. 1. Have you developed a fever since your procedure? no  2.   Have you had an respiratory symptoms (SOB or cough) since your procedure? no  3.   Have you tested positive for COVID 19 since your procedure no  4.   Have you had any family members/close contacts diagnosed with the COVID 19 since your procedure?  no   If yes to any of these questions please route to Joylene John, RN and Alphonsa Gin, Therapist, sports.

## 2019-11-09 ENCOUNTER — Ambulatory Visit: Payer: Medicare Other | Admitting: Cardiology

## 2019-11-09 ENCOUNTER — Encounter: Payer: Self-pay | Admitting: Gastroenterology

## 2019-11-11 ENCOUNTER — Other Ambulatory Visit: Payer: Self-pay

## 2019-11-14 ENCOUNTER — Ambulatory Visit: Payer: Medicare Other | Admitting: Cardiology

## 2019-11-14 ENCOUNTER — Ambulatory Visit (INDEPENDENT_AMBULATORY_CARE_PROVIDER_SITE_OTHER): Payer: Medicare Other | Admitting: Internal Medicine

## 2019-11-14 ENCOUNTER — Encounter: Payer: Self-pay | Admitting: Internal Medicine

## 2019-11-14 ENCOUNTER — Other Ambulatory Visit: Payer: Self-pay

## 2019-11-14 VITALS — BP 119/63 | HR 51 | Temp 96.3°F | Resp 18 | Ht 69.0 in | Wt 208.0 lb

## 2019-11-14 DIAGNOSIS — Z0000 Encounter for general adult medical examination without abnormal findings: Secondary | ICD-10-CM

## 2019-11-14 DIAGNOSIS — E782 Mixed hyperlipidemia: Secondary | ICD-10-CM | POA: Diagnosis not present

## 2019-11-14 LAB — LIPID PANEL
Cholesterol: 140 mg/dL (ref 0–200)
HDL: 42.4 mg/dL (ref >39.00)
LDL Cholesterol: 83 mg/dL (ref 0–99)
NonHDL: 97.28
Total CHOL/HDL Ratio: 3
Triglycerides: 73 mg/dL (ref 0.0–149.0)
VLDL: 14.6 mg/dL (ref 0.0–40.0)

## 2019-11-14 NOTE — Patient Instructions (Addendum)
Please schedule Medicare Wellness with Glenard Haring.   GO TO THE LAB : Get the blood work     GO TO THE FRONT DESK Schedule your next appointment   for a checkup in 6 months  Continue checking your blood pressure BP GOAL is between 110/65 and  135/85. If it is consistently higher or lower, let me know

## 2019-11-14 NOTE — Progress Notes (Signed)
Pre visit review using our clinic review tool, if applicable. No additional management support is needed unless otherwise documented below in the visit note. 

## 2019-11-14 NOTE — Progress Notes (Signed)
Subjective:    Patient ID: Robert Krause, male    DOB: 1934-03-09, 85 y.o.   MRN: [id number]  DOS:  11/14/2019 Type of visit - description: CPX In general feeling well. Was seen by GI, had a EGD, report reviewed, continue with some nausea. Appetite is slightly decreased. No weight loss noted. Denies depression.  Wt Readings from Last 3 Encounters:  11/14/19 208 lb (94.3 kg)  11/01/19 207 lb (93.9 kg)  10/10/19 209 lb 2 oz (94.9 kg)     Review of Systems   Other than above, a 14 point review of systems is negative    Past Medical History:  Diagnosis Date  . Adenomatous polyp of colon 10/2005  . Allergic rhinitis   . Anxiety   . BPH (benign prostatic hyperplasia)   . Chronic interstitial cystitis    Dr Amalia Hailey  . Chronic sinusitis   . CTS (carpal tunnel syndrome) 01/27/2017  . Glaucoma   . Hyperlipidemia   . Hypertension   . Osteoarthritis    no per pt  . [place]    right inferior clavicle, shave    Past Surgical History:  Procedure Laterality Date  . APPENDECTOMY    . CATARACT EXTRACTION Right 06-2015  . COLONOSCOPY    . POLYPECTOMY    . TRANSURETHRAL RESECTION OF PROSTATE    . UPPER GASTROINTESTINAL ENDOSCOPY      Social History   Socioeconomic History  . Marital status: Married    Spouse name: Not on file  . Number of children: 1  . Years of education: Not on file  . Highest education level: Not on file  Occupational History  . Occupation: retired- Company secretary: RETIRED  Tobacco Use  . Smoking status: Never Smoker  . Smokeless tobacco: Never Used  Substance and Sexual Activity  . Alcohol use: Yes    Alcohol/week: 3.0 standard drinks    Types: 3 Glasses of wine per week    Comment: wine  . Drug use: No  . Sexual activity: Not on file  Other Topics Concern  . Not on file  Social History Narrative   Born in [place], raised in [place], moved to [place] in the 60s   Household: pt and wife   Son lives in the [place] Strain:   . Difficulty of Paying Living Expenses: Not on file  Food Insecurity:   . Worried About Charity fundraiser in the Last Year: Not on file  . Ran Out of Food in the Last Year: Not on file  Transportation Needs:   . Lack of Transportation (Medical): Not on file  . Lack of Transportation (Non-Medical): Not on file  Physical Activity:   . Days of Exercise per Week: Not on file  . Minutes of Exercise per Session: Not on file  Stress:   . Feeling of Stress : Not on file  Social Connections:   . Frequency of Communication with Friends and Family: Not on file  . Frequency of Social Gatherings with Friends and Family: Not on file  . Attends Religious Services: Not on file  . Active Member of Clubs or Organizations: Not on file  . Attends Archivist Meetings: Not on file  . Marital Status: Not on file  Intimate Partner Violence:   . Fear of Current or Ex-Partner: Not on file  . Emotionally Abused: Not on file  . Physically Abused: Not  on file  . Sexually Abused: Not on file     Family History  Problem Relation Age of Onset  . Anxiety disorder Other   . Hyperlipidemia Father   . Hypertension Father   . CAD Father   . Colon cancer Neg Hx   . Prostate cancer Neg Hx   . Esophageal cancer Neg Hx   . Rectal cancer Neg Hx   . Stomach cancer Neg Hx      Allergies as of 11/14/2019   No Known Allergies     Medication List       Accurate as of November 14, 2019 11:59 PM. If you have any questions, ask your nurse or doctor.        irbesartan-hydrochlorothiazide 150-12.5 MG tablet Commonly known as: AVALIDE Take 1 tablet by mouth daily.   metoCLOPramide 5 MG tablet Commonly known as: Reglan Take 1 tablet (5 mg total) by mouth 3 (three) times daily before meals.   ondansetron 4 MG tablet Commonly known as: ZOFRAN TAKE 1 TABLET(4 MG) BY MOUTH EVERY 8 HOURS AS NEEDED FOR NAUSEA OR VOMITING   pantoprazole 40 MG tablet Commonly  known as: PROTONIX Take 1 tablet (40 mg total) by mouth 2 (two) times daily.   pravastatin 10 MG tablet Commonly known as: PRAVACHOL Take 1 tablet (10 mg total) by mouth daily.   tamsulosin 0.4 MG Caps capsule Commonly known as: FLOMAX Take 1 capsule by mouth Daily.   timolol 0.5 % ophthalmic solution Commonly known as: TIMOPTIC PLACE 1 DROP INTO BOTH EYES TWICE DAILY           Objective:   Physical Exam BP 119/63 (BP Location: Left Arm, Patient Position: Sitting, Cuff Size: Normal)   Pulse (!) 51   Temp (!) 96.3 F (35.7 C) (Temporal)   Resp 18   Ht 5\' 9"  (1.753 m)   Wt 208 lb (94.3 kg)   SpO2 100%   BMI 30.72 kg/m  General: Well developed, NAD, BMI noted Neck: No  thyromegaly  HEENT:  Normocephalic . Face symmetric, atraumatic Lungs:  CTA B Normal respiratory effort, no intercostal retractions, no accessory muscle use. Heart: Bradycardic,  no murmur.  No pretibial edema bilaterally  Abdomen:  Not distended, soft, non-tender. No rebound or rigidity.   Skin: Exposed areas without rash. Not pale. Not jaundice Neurologic:  alert & oriented X3.  Speech normal, gait appropriate for age and unassisted Strength symmetric and appropriate for age.  Psych: Cognition and judgment appear intact.  Cooperative with normal attention span and concentration.  Behavior appropriate. No anxious or depressed appearing.     Assessment     Assessment Prediabetes HTN CV: --Bradycardia: Holter 2016 essentially negative --06/07/2019 dx was  DOE and palpitations: ECHO unremarkable, nuclear stress test EF 45%, no ischemic changes Hyperlipidemia Anxiety:  -on xanax, stress related to interstitial cystitis - lexapro 05/2019 (s/e HA, constipation) Insomnia: On melatonin, not interested in other medications DJD CTS: B, offered surgery before  Obesity: BMI 32 Glaucoma GU: Dr. Amalia Hailey --BPH, TURP --Interstitial cystitis (on gabapentin prn) [place], right inferior  clavicle  PLAN Here for CPX Prediabetes: Last A1c satisfactory HTN: Seems well controlled, continue Avalide. High cholesterol: On Pravachol, check FLP Anxiety: Not an issue at this point Nausea: Still have some symptoms, recently had another EGD, essentially negative.  BX: Reactive gastropathy, no H. pylori. Low TSH: Last labs normal, reassess on RTC RTC 6 months    This visit occurred during the SARS-CoV-2 public health emergency.  Safety protocols were in place, including screening questions prior to the visit, additional usage of staff PPE, and extensive cleaning of exam room while observing appropriate contact time as indicated for disinfecting solutions.

## 2019-11-15 ENCOUNTER — Other Ambulatory Visit: Payer: Self-pay | Admitting: Gastroenterology

## 2019-11-15 NOTE — Assessment & Plan Note (Signed)
-  Td :2017  - pnm 23 2004,  booster 2016; prevanr  2015 - zostavax-- 2014 per pt - reports had shingrix (x2) - had a flu shot  - CCS   cscope 2012, Cscope 12/2017, next per GI  -Prostate ca screening-- per urology -Discussed diet-exercise -labs: FLP

## 2019-11-15 NOTE — Assessment & Plan Note (Signed)
Here for CPX Prediabetes: Last A1c satisfactory HTN: Seems well controlled, continue Avalide. High cholesterol: On Pravachol, check FLP Anxiety: Not an issue at this point Nausea: Still have some symptoms, recently had another EGD, essentially negative.  BX: Reactive gastropathy, no H. pylori. Low TSH: Last labs normal, reassess on RTC RTC 6 months

## 2019-12-08 ENCOUNTER — Ambulatory Visit: Payer: Medicare Other | Attending: Internal Medicine

## 2019-12-08 DIAGNOSIS — Z23 Encounter for immunization: Secondary | ICD-10-CM | POA: Insufficient documentation

## 2019-12-08 NOTE — Progress Notes (Signed)
   Covid-19 Vaccination Clinic  Name:  FAYEZ BILLIPS    MRN: [id number] DOB: [birth year]  12/08/2019  Mr. Toups was observed post Covid-19 immunization for 15 minutes without incidence. He was provided with Vaccine Information Sheet and instruction to access the V-Safe system.   Mr. Lomboy was instructed to call 911 with any severe reactions post vaccine: Marland Kitchen Difficulty breathing  . Swelling of your face and throat  . A fast heartbeat  . A bad rash all over your body  . Dizziness and weakness    Immunizations Administered    Name Date Dose VIS Date Route   Pfizer COVID-19 Vaccine 12/08/2019 11:54 AM 0.3 mL 11/04/2019 Intramuscular   Manufacturer: [place]   Lot: [id number]   [place]: [id number]

## 2019-12-09 ENCOUNTER — Ambulatory Visit: Payer: Medicare Other

## 2019-12-15 ENCOUNTER — Other Ambulatory Visit: Payer: Self-pay | Admitting: Internal Medicine

## 2019-12-26 ENCOUNTER — Ambulatory Visit: Payer: Medicare Other | Attending: Internal Medicine

## 2019-12-26 ENCOUNTER — Ambulatory Visit: Payer: Medicare Other

## 2019-12-26 DIAGNOSIS — Z23 Encounter for immunization: Secondary | ICD-10-CM | POA: Insufficient documentation

## 2019-12-26 NOTE — Progress Notes (Signed)
   Covid-19 Vaccination Clinic  Name:  Robert Krause    MRN: [id number] DOB: May 20, 1934  12/26/2019  [name] was observed post Covid-19 immunization for 15 minutes without incidence. He was provided with Vaccine Information Sheet and instruction to access the V-Safe system.   [name] was instructed to call 911 with any severe reactions post vaccine: Marland Kitchen Difficulty breathing  . Swelling of your face and throat  . A fast heartbeat  . A bad rash all over your body  . Dizziness and weakness    Immunizations Administered    Name Date Dose VIS Date Route   Pfizer COVID-19 Vaccine 12/26/2019  8:10 AM 0.3 mL 11/04/2019 Intramuscular   Manufacturer: [place]   Lot: [id number]   [place]: [id number]

## 2020-01-26 ENCOUNTER — Other Ambulatory Visit: Payer: Self-pay | Admitting: Internal Medicine

## 2020-02-21 DIAGNOSIS — N3010 Interstitial cystitis (chronic) without hematuria: Secondary | ICD-10-CM | POA: Diagnosis not present

## 2020-03-09 ENCOUNTER — Ambulatory Visit (INDEPENDENT_AMBULATORY_CARE_PROVIDER_SITE_OTHER): Payer: Medicare Other | Admitting: Family

## 2020-03-09 ENCOUNTER — Other Ambulatory Visit: Payer: Self-pay

## 2020-03-09 ENCOUNTER — Ambulatory Visit (HOSPITAL_BASED_OUTPATIENT_CLINIC_OR_DEPARTMENT_OTHER)
Admission: RE | Admit: 2020-03-09 | Discharge: 2020-03-09 | Disposition: A | Discharge status: Home / Self Care | Payer: Medicare Other | Source: Ambulatory Visit | Attending: Family | Admitting: Family

## 2020-03-09 ENCOUNTER — Encounter: Payer: Self-pay | Admitting: Family

## 2020-03-09 VITALS — BP 139/73 | HR 55 | Temp 97.3°F | Resp 16 | Ht 70.0 in | Wt 206.0 lb

## 2020-03-09 DIAGNOSIS — M79662 Pain in left lower leg: Secondary | ICD-10-CM | POA: Diagnosis not present

## 2020-03-09 DIAGNOSIS — E0590 Thyrotoxicosis, unspecified without thyrotoxic crisis or storm: Secondary | ICD-10-CM

## 2020-03-09 NOTE — Patient Instructions (Signed)
Please go to imaging on the first floor now for ultrasound of your leg.

## 2020-03-10 NOTE — Progress Notes (Signed)
Subjective:    Patient ID: Robert Krause, male    DOB: 1934/09/21, 86 y.o.   MRN: [id number]  HPI Patient is an 86 yr old male who presents today with chief complaint of left calf pain. Reports that this started 2-3 days ago. Couldn't sleep due to pain. Denies hx of clot. Denies recent travel. Denies CP/SOB.  Review of Systems  See HPI      Past Medical History:  Diagnosis Date  . Adenomatous polyp of colon 10/2005  . Allergic rhinitis   . Anxiety   . BPH (benign prostatic hyperplasia)   . Chronic interstitial cystitis    Dr Amalia Hailey  . Chronic sinusitis   . CTS (carpal tunnel syndrome) 01/27/2017  . Glaucoma   . Hyperlipidemia   . Hypertension   . Osteoarthritis    no per pt  . [place]    right inferior clavicle, shave   Social History        Socioeconomic History  . Marital status: Married    Spouse name: Not on file  . Number of children: 1  . Years of education: Not on file  . Highest education level: Not on file  Occupational History  . Occupation: retired- Company secretary: RETIRED  Tobacco Use  . Smoking status: Never Smoker  . Smokeless tobacco: Never Used  Substance and Sexual Activity  . Alcohol use: Yes    Alcohol/week: 3.0 standard drinks    Types: 3 Glasses of wine per week    Comment: wine  . Drug use: No  . Sexual activity: Not on file  Other Topics Concern  . Not on file  Social History Narrative   Born in [place], raised in [place], moved to [place] in the 60s   Household: pt and wife   Son lives in the [place] Strain:   . Difficulty of Paying Living Expenses:   Food Insecurity:   . Worried About Charity fundraiser in the Last Year:   . Arboriculturist in the Last Year:   Transportation Needs:   . Film/video editor (Medical):   Marland Kitchen Lack of Transportation (Non-Medical):   Physical Activity:   . Days of Exercise per Week:   . Minutes of Exercise per Session:   Stress:   .  Feeling of Stress :   Social Connections:   . Frequency of Communication with Friends and Family:   . Frequency of Social Gatherings with Friends and Family:   . Attends Religious Services:   . Active Member of Clubs or Organizations:   . Attends Archivist Meetings:   Marland Kitchen Marital Status:   Intimate Partner Violence:   . Fear of Current or Ex-Partner:   . Emotionally Abused:   Marland Kitchen Physically Abused:   . Sexually Abused:         Past Surgical History:  Procedure Laterality Date  . APPENDECTOMY    . CATARACT EXTRACTION Right 06-2015  . COLONOSCOPY    . POLYPECTOMY    . TRANSURETHRAL RESECTION OF PROSTATE    . UPPER GASTROINTESTINAL ENDOSCOPY          Family History  Problem Relation Age of Onset  . Anxiety disorder Other   . Hyperlipidemia Father   . Hypertension Father   . CAD Father   . Colon cancer Neg Hx   . Prostate cancer Neg Hx   .  Esophageal cancer Neg Hx   . Rectal cancer Neg Hx   . Stomach cancer Neg Hx    No Known Allergies        Current Outpatient Medications on File Prior to Visit  Medication Sig Dispense Refill  . irbesartan-hydrochlorothiazide (AVALIDE) 150-12.5 MG tablet Take 1 tablet by mouth daily. 90 tablet 2  . metoCLOPramide (REGLAN) 5 MG tablet Take 1 tablet (5 mg total) by mouth 3 (three) times daily before meals. 90 tablet 1  . ondansetron (ZOFRAN) 4 MG tablet TAKE 1 TABLET(4 MG) BY MOUTH EVERY 8 HOURS AS NEEDED FOR NAUSEA OR VOMITING 30 tablet 1  . pantoprazole (PROTONIX) 40 MG tablet TAKE 1 TABLET(40 MG) BY MOUTH TWICE DAILY 60 tablet 11  . pravastatin (PRAVACHOL) 10 MG tablet Take 1 tablet (10 mg total) by mouth daily. 90 tablet 3  . Tamsulosin HCl (FLOMAX) 0.4 MG CAPS Take 1 capsule by mouth Daily.    . timolol (TIMOPTIC) 0.5 % ophthalmic solution PLACE 1 DROP INTO BOTH EYES TWICE DAILY 10 mL 5   No current facility-administered medications on file prior to visit.   BP 139/73 (BP Location: Right Arm, Patient Position: Sitting, Cuff  Size: Small)  Pulse (!) 55  Temp (!) 97.3 F (36.3 C) (Temporal)  Resp 16  Ht 5\' 10"  (1.778 m)  Wt 206 lb (93.4 kg)  SpO2 100%  BMI 29.56 kg/m     Review of Systems    see HPI Objective:   Physical Exam Gen: awake, alert, well nourished CV:  S1/S2, RRR Resp: Breath sounds clear bilaterally, no wheezes, rales, rhonchi Ext:  Bilateral 2+ LE edema.  + left calf tenderness, no erythema, negative homans       Assessment & Plan:  Calf pain- LE doppler is obtained and is negative for DVT, does not bakers cyst.  Pain is lkely musculoskeletal. Pt is advised to elevate leg as able, tylenol prn pain. Call if symptoms worsen or if not improved in 3-4 days.  Hyperthyroidism- had transient depressed tsh. Pt is requesting repeat TSH.  This visit occurred during the SARS-CoV-2 public health emergency.  Safety protocols were in place, including screening questions prior to the visit, additional usage of staff PPE, and extensive cleaning of exam room while observing appropriate contact time as indicated for disinfecting solutions.

## 2020-03-15 ENCOUNTER — Other Ambulatory Visit (INDEPENDENT_AMBULATORY_CARE_PROVIDER_SITE_OTHER): Payer: Medicare Other

## 2020-03-15 ENCOUNTER — Other Ambulatory Visit: Payer: Self-pay

## 2020-03-15 DIAGNOSIS — E0590 Thyrotoxicosis, unspecified without thyrotoxic crisis or storm: Secondary | ICD-10-CM

## 2020-03-15 LAB — TSH: TSH: 2.19 u[IU]/mL (ref 0.35–4.50)

## 2020-04-10 ENCOUNTER — Telehealth: Payer: Self-pay | Admitting: Internal Medicine

## 2020-04-10 NOTE — Progress Notes (Signed)
°  Chronic Care Management   Outreach Note  04/10/2020 Name: Robert Krause MRN: [id number] DOB: [birth year]  Referred by: Colon Branch, MD Reason for referral : No chief complaint on file.   An unsuccessful telephone outreach was attempted today. The patient was referred to the pharmacist for assistance with care management and care coordination.   This note is not being shared with the patient for the following reason: To respect privacy (The patient or proxy has requested that the information not be shared).  Follow Up Plan:   Earney Hamburg Upstream Scheduler

## 2020-05-01 ENCOUNTER — Telehealth: Payer: Self-pay | Admitting: Internal Medicine

## 2020-05-01 NOTE — Progress Notes (Signed)
  Chronic Care Management   Note  05/01/2020 Name: Robert Krause MRN: [id number] DOB: 1934/07/31  [name] is a 86 y.o. year old male who is a primary care patient of Colon Branch, MD. I reached out to Donnelly Angelica by phone today in response to a referral sent by [name]'s PCP, Colon Branch, MD.   [name] was given information about Chronic Care Management services today including:  1. CCM service includes personalized support from designated clinical staff supervised by his physician, including individualized plan of care and coordination with other care providers 2. 24/7 contact phone numbers for assistance for urgent and routine care needs. 3. Service will only be billed when office clinical staff spend 20 minutes or more in a month to coordinate care. 4. Only one practitioner may furnish and bill the service in a calendar month. 5. The patient may stop CCM services at any time (effective at the end of the month) by phone call to the office staff.   Patient agreed to services and verbal consent obtained.   This note is not being shared with the patient for the following reason: To respect privacy (The patient or proxy has requested that the information not be shared).  Follow up plan:   Earney Hamburg Upstream Scheduler

## 2020-05-01 NOTE — Progress Notes (Signed)
  Chronic Care Management   Outreach Note  05/01/2020 Name: Robert Krause MRN: [id number] DOB: [birth year]  Referred by: Colon Branch, MD Reason for referral : No chief complaint on file.   An unsuccessful telephone outreach was attempted today. The patient was referred to the pharmacist for assistance with care management and care coordination.   This note is not being shared with the patient for the following reason: To respect privacy (The patient or proxy has requested that the information not be shared).  Follow Up Plan:   Earney Hamburg Upstream Scheduler

## 2020-05-09 ENCOUNTER — Other Ambulatory Visit: Payer: Self-pay

## 2020-05-15 ENCOUNTER — Ambulatory Visit (HOSPITAL_BASED_OUTPATIENT_CLINIC_OR_DEPARTMENT_OTHER)
Admission: RE | Admit: 2020-05-15 | Discharge: 2020-05-15 | Disposition: A | Discharge status: Home / Self Care | Payer: Medicare Other | Source: Ambulatory Visit | Attending: Internal Medicine | Admitting: Internal Medicine

## 2020-05-15 ENCOUNTER — Encounter: Payer: Self-pay | Admitting: Internal Medicine

## 2020-05-15 ENCOUNTER — Ambulatory Visit (INDEPENDENT_AMBULATORY_CARE_PROVIDER_SITE_OTHER): Payer: Medicare Other | Admitting: Internal Medicine

## 2020-05-15 ENCOUNTER — Other Ambulatory Visit: Payer: Self-pay

## 2020-05-15 VITALS — BP 120/66 | HR 56 | Temp 97.3°F | Resp 18 | Ht 70.0 in | Wt 197.2 lb

## 2020-05-15 DIAGNOSIS — R110 Nausea: Secondary | ICD-10-CM

## 2020-05-15 DIAGNOSIS — R739 Hyperglycemia, unspecified: Secondary | ICD-10-CM | POA: Diagnosis not present

## 2020-05-15 DIAGNOSIS — I10 Essential (primary) hypertension: Secondary | ICD-10-CM | POA: Diagnosis not present

## 2020-05-15 DIAGNOSIS — F329 Major depressive disorder, single episode, unspecified: Secondary | ICD-10-CM

## 2020-05-15 DIAGNOSIS — R634 Abnormal weight loss: Secondary | ICD-10-CM | POA: Insufficient documentation

## 2020-05-15 DIAGNOSIS — F419 Anxiety disorder, unspecified: Secondary | ICD-10-CM

## 2020-05-15 LAB — COMPREHENSIVE METABOLIC PANEL
ALT: 16 U/L (ref 0–53)
AST: 19 U/L (ref 0–37)
Albumin: 3.8 g/dL (ref 3.5–5.2)
Alkaline Phosphatase: 59 U/L (ref 39–117)
BUN: 20 mg/dL (ref 6–23)
CO2: 32 mEq/L (ref 19–32)
Calcium: 8.9 mg/dL (ref 8.4–10.5)
Chloride: 101 mEq/L (ref 96–112)
Creatinine, Ser: 1.12 mg/dL (ref 0.40–1.50)
GFR: 62.53 mL/min (ref >60.00)
Glucose, Bld: 115 mg/dL — ABNORMAL HIGH (ref 70–99)
Potassium: 4.3 mEq/L (ref 3.5–5.1)
Sodium: 136 mEq/L (ref 135–145)
Total Bilirubin: 1 mg/dL (ref 0.2–1.2)
Total Protein: 6.4 g/dL (ref 6.0–8.3)

## 2020-05-15 LAB — VITAMIN D 25 HYDROXY (VIT D DEFICIENCY, FRACTURES): VITD: 35.88 ng/mL (ref 30.00–100.00)

## 2020-05-15 LAB — HEMOGLOBIN A1C: Hgb A1c MFr Bld: 5.5 % (ref 4.6–6.5)

## 2020-05-15 LAB — B12 AND FOLATE PANEL
Folate: 20.3 ng/mL (ref >5.9)
Vitamin B-12: 492 pg/mL (ref 211–911)

## 2020-05-15 LAB — SEDIMENTATION RATE: Sed Rate: 1 mm/hr (ref 0–20)

## 2020-05-15 NOTE — Progress Notes (Signed)
Subjective:    Patient ID: Robert Krause, male    DOB: 1934/11/22, 86 y.o.   MRN: [id number]  DOS:  05/15/2020 Type of visit - description: Routine visit States that he is not feeling well. His main concern is lack of energy for the last 4 to 5 months. He was able to walk   6 miles few times a week and now he can barely walk 1 mile. Denies DOE, chest pain, palpitations, he simply gets tired. Ambulatory BPs are never low.  Has also noted gradual weight loss.  When asked, he easily admitts anxiety and depression, for several months, he is certain this is related to the quarantine and pandemia.  No major problems at home, no financial problems. When he goes out of the house he feels very anxious, often times feels sad, down. Denies suicidal ideas.  Nausea: Ongoing issue.   Wt Readings from Last 3 Encounters:  05/15/20 197 lb 4 oz (89.5 kg)  03/09/20 206 lb (93.4 kg)  11/14/19 208 lb (94.3 kg)     Review of Systems Denies fever, chills.  No night sweats. Admits to decreased appetite early satiety. Mild lower extremity edema at baseline No vomiting, diarrhea, blood in the stools or abdominal pain. No cough No headache. No visual disturbances  Past Medical History:  Diagnosis Date  . Adenomatous polyp of colon 10/2005  . Allergic rhinitis   . Anxiety   . BPH (benign prostatic hyperplasia)   . Chronic interstitial cystitis    Dr Amalia Hailey  . Chronic sinusitis   . CTS (carpal tunnel syndrome) 01/27/2017  . Glaucoma   . Hyperlipidemia   . Hypertension   . Osteoarthritis    no per pt  . [place]    right inferior clavicle, shave    Past Surgical History:  Procedure Laterality Date  . APPENDECTOMY    . CATARACT EXTRACTION Right 06-2015  . COLONOSCOPY    . POLYPECTOMY    . TRANSURETHRAL RESECTION OF PROSTATE    . UPPER GASTROINTESTINAL ENDOSCOPY      Allergies as of 05/15/2020   No Known Allergies     Medication List       Accurate as of May 15, 2020  11:59 PM. If you have any questions, ask your nurse or doctor.        ALPRAZolam 0.25 MG tablet Commonly known as: XANAX Take 0.25-0.5 mg by mouth at bedtime as needed.   irbesartan-hydrochlorothiazide 150-12.5 MG tablet Commonly known as: AVALIDE Take 1 tablet by mouth daily.   metoCLOPramide 5 MG tablet Commonly known as: Reglan Take 1 tablet (5 mg total) by mouth 3 (three) times daily before meals.   ondansetron 4 MG tablet Commonly known as: ZOFRAN TAKE 1 TABLET(4 MG) BY MOUTH EVERY 8 HOURS AS NEEDED FOR NAUSEA OR VOMITING   pantoprazole 40 MG tablet Commonly known as: PROTONIX TAKE 1 TABLET(40 MG) BY MOUTH TWICE DAILY   pravastatin 10 MG tablet Commonly known as: PRAVACHOL Take 1 tablet (10 mg total) by mouth daily.   tamsulosin 0.4 MG Caps capsule Commonly known as: FLOMAX Take 1 capsule by mouth Daily.   timolol 0.5 % ophthalmic solution Commonly known as: TIMOPTIC PLACE 1 DROP INTO BOTH EYES TWICE DAILY          Objective:   Physical Exam BP 120/66 (BP Location: Left Arm, Patient Position: Sitting, Cuff Size: Small)   Pulse (!) 56   Temp (!) 97.3 F (36.3 C) (Temporal)   Resp  18   Ht 5\' 10"  (1.778 m)   Wt 197 lb 4 oz (89.5 kg)   SpO2 99%   BMI 28.30 kg/m  General:   Well developed, NAD, BMI noted.  HEENT:  Normocephalic . Face symmetric, atraumatic Neck: No thyromegaly Lungs:  CTA B Normal respiratory effort, no intercostal retractions, no accessory muscle use. Heart: RRR,  no murmur.  Abdomen:  Not distended, soft, non-tender. No rebound or rigidity.   Skin: Not pale. Not jaundice Lower extremities: no pretibial edema bilaterally  Neurologic:  alert & oriented X3.  Speech normal, gait appropriate for age and unassisted Psych--  Cognition and judgment appear intact.  Cooperative with normal attention span and concentration.  Behavior appropriate. No anxious or depressed appearing.     Assessment     Assessment Prediabetes HTN CV: --Bradycardia: Holter 2016 essentially negative --06/07/2019 dx was  DOE and palpitations: ECHO unremarkable, nuclear stress test EF 45%, no ischemic changes Hyperlipidemia Anxiety:  -on xanax, stress related to interstitial cystitis - lexapro 05/2019 (s/e HA, constipation) Insomnia: On melatonin, not interested in other medications DJD CTS: B, offered surgery before  Obesity: BMI 32 Glaucoma GU: Dr. Amalia Hailey --BPH, TURP --Interstitial cystitis (on gabapentin prn) [place], right inferior clavicle  PLAN Fatigue: As described above, no cardiopulmonary symptoms but will see cardiology soon. No low BPs , chronic bradycardia noted. We will do general labs including CMP, CBC with blood smear, M3W, sed rate, folic acid, G66 and vitamin D.  Also chest x-ray Weight loss:  Gradual  since 05-2019.  Baseline weigh 215 pounds, 09-2019: 207 pounds, today 197 pounds. Unclear etiology, no fever chills, no headache, night sweats.  Possibly related to anxiety and depression.  Will do labs.  See above. Anxiety depression insomnia: Related to the quarantine -pandemia, history of side effects with Lexapro, on Xanax but he does not like to take often.  PHQ-9: 9 (mild). Declines SSRIs. Chronic nausea: Denies headache, no  abdominal pain or GI symptoms. W/u has included: GB U/S 2017 (-),  saw GI, EGD 11-2018: Erosive gastritis, BX with no H. pylori, + reactive gastropathy.   CT abdomen 08-2019: Benign. Hyperglycemia: Check an A1c RTC 4  Weeks    This visit occurred during the SARS-CoV-2 public health emergency.  Safety protocols were in place, including screening questions prior to the visit, additional usage of staff PPE, and extensive cleaning of exam room while observing appropriate contact time as indicated for disinfecting solutions.

## 2020-05-15 NOTE — Patient Instructions (Addendum)
Please schedule Medicare Wellness with Glenard Haring.      GO TO THE LAB : Get the blood work     GO TO THE FRONT DESK, [place] Come back for a check up in 4 weeks    STOP BY THE FIRST FLOOR:  get the XR

## 2020-05-15 NOTE — Progress Notes (Signed)
Pre visit review using our clinic review tool, if applicable. No additional management support is needed unless otherwise documented below in the visit note. 

## 2020-05-16 LAB — CBC (INCLUDES DIFF/PLT) WITH PATHOLOGIST REVIEW
Absolute Monocytes: 580 cells/uL (ref 200–950)
Basophils Absolute: 93 cells/uL (ref 0–200)
Basophils Relative: 1.6 %
Eosinophils Absolute: 360 cells/uL (ref 15–500)
Eosinophils Relative: 6.2 %
HCT: 45.7 % (ref 38.5–50.0)
Hemoglobin: 15.5 g/dL (ref 13.2–17.1)
Lymphs Abs: 1694 cells/uL (ref 850–3900)
MCH: 32 pg (ref 27.0–33.0)
MCHC: 33.9 g/dL (ref 32.0–36.0)
MCV: 94.4 fL (ref 80.0–100.0)
MPV: 10.9 fL (ref 7.5–12.5)
Monocytes Relative: 10 %
Neutro Abs: 3074 cells/uL (ref 1500–7800)
Neutrophils Relative %: 53 %
Platelets: 181 10*3/uL (ref 140–400)
RBC: 4.84 10*6/uL (ref 4.20–5.80)
RDW: 11.9 % (ref 11.0–15.0)
Total Lymphocyte: 29.2 %
WBC: 5.8 10*3/uL (ref 3.8–10.8)

## 2020-05-16 NOTE — Assessment & Plan Note (Signed)
Fatigue: As described above, no cardiopulmonary symptoms but will see cardiology soon. No low BPs , chronic bradycardia noted. We will do general labs including CMP, CBC with blood smear, M5Y, sed rate, folic acid, Y50 and vitamin D.  Also chest x-ray Weight loss:  Gradual  since 05-2019.  Baseline weigh 215 pounds, 09-2019: 207 pounds, today 197 pounds. Unclear etiology, no fever chills, no headache, night sweats.  Possibly related to anxiety and depression.  Will do labs.  See above. Anxiety depression insomnia: Related to the quarantine -pandemia, history of side effects with Lexapro, on Xanax but he does not like to take often.  PHQ-9: 9 (mild). Declines SSRIs. Chronic nausea: Denies headache, no  abdominal pain or GI symptoms. W/u has included: GB U/S 2017 (-),  saw GI, EGD 11-2018: Erosive gastritis, BX with no H. pylori, + reactive gastropathy.   CT abdomen 08-2019: Benign. Hyperglycemia: Check an A1c RTC 4  Weeks

## 2020-05-21 ENCOUNTER — Other Ambulatory Visit: Payer: Self-pay

## 2020-05-21 ENCOUNTER — Ambulatory Visit: Payer: Medicare Other | Admitting: Cardiology

## 2020-05-21 ENCOUNTER — Encounter: Payer: Self-pay | Admitting: Cardiology

## 2020-05-21 VITALS — BP 110/70 | HR 56 | Ht 70.0 in | Wt 199.0 lb

## 2020-05-21 DIAGNOSIS — I10 Essential (primary) hypertension: Secondary | ICD-10-CM | POA: Diagnosis not present

## 2020-05-21 DIAGNOSIS — E782 Mixed hyperlipidemia: Secondary | ICD-10-CM

## 2020-05-21 NOTE — Patient Instructions (Signed)

## 2020-05-21 NOTE — Progress Notes (Signed)
Cardiology Office Note:    Date:  05/21/2020   ID:  Robert Krause, DOB 03-22-1934, MRN [id number]  PCP:  Colon Branch, MD  Cardiologist:  Jenean Lindau, MD   Referring MD: Colon Branch, MD    ASSESSMENT:    1. Essential hypertension   2. Mixed hyperlipidemia    PLAN:    In order of problems listed above:  1. Primary prevention stressed with the patient.  Importance of compliance with diet medication stressed and he vocalized understanding.  He has good effort tolerance. 2. Essential hypertension: Blood pressure is stable and I discussed this with him at length.  Salt intake issues were discussed 3. Mixed dyslipidemia: Diet was mentioned and he promises to keep a strict diet which he already is.  He is very health-conscious. 4. Patient will be seen in follow-up appointment in 6 months or earlier if the patient has any concerns    Medication Adjustments/Labs and Tests Ordered: Current medicines are reviewed at length with the patient today.  Concerns regarding medicines are outlined above.  No orders of the defined types were placed in this encounter.  No orders of the defined types were placed in this encounter.    Chief Complaint  Patient presents with  . Follow-up     History of Present Illness:    Robert Krause is a 86 y.o. male.  Patient has past medical history of essential hypertension and mixed dyslipidemia.  He denies any problems at this time and takes care of activities of daily living.  No chest pain orthopnea or PND.  At the time of my evaluation, the patient is alert awake oriented and in no distress.  He walks a mile on a regular basis.  Past Medical History:  Diagnosis Date  . Adenomatous polyp of colon 10/2005  . Allergic rhinitis   . Anxiety   . BPH (benign prostatic hyperplasia)   . Chronic interstitial cystitis    Dr Amalia Hailey  . Chronic sinusitis   . CTS (carpal tunnel syndrome) 01/27/2017  . Glaucoma   . Hyperlipidemia   . Hypertension     . Osteoarthritis    no per pt  . Venous lake    right inferior clavicle, shave    Past Surgical History:  Procedure Laterality Date  . APPENDECTOMY    . CATARACT EXTRACTION Right 06-2015  . COLONOSCOPY    . POLYPECTOMY    . TRANSURETHRAL RESECTION OF PROSTATE    . UPPER GASTROINTESTINAL ENDOSCOPY      Current Medications: Current Meds  Medication Sig  . irbesartan-hydrochlorothiazide (AVALIDE) 150-12.5 MG tablet Take 1 tablet by mouth daily.  . ondansetron (ZOFRAN) 4 MG tablet TAKE 1 TABLET(4 MG) BY MOUTH EVERY 8 HOURS AS NEEDED FOR NAUSEA OR VOMITING  . pantoprazole (PROTONIX) 40 MG tablet TAKE 1 TABLET(40 MG) BY MOUTH TWICE DAILY  . pravastatin (PRAVACHOL) 10 MG tablet Take 1 tablet (10 mg total) by mouth daily.  . Tamsulosin HCl (FLOMAX) 0.4 MG CAPS Take 1 capsule by mouth Daily.  . timolol (TIMOPTIC) 0.5 % ophthalmic solution PLACE 1 DROP INTO BOTH EYES TWICE DAILY     Allergies:   Patient has no known allergies.   Social History   Socioeconomic History  . Marital status: Married    Spouse name: Not on file  . Number of children: 1  . Years of education: Not on file  . Highest education level: Not on file  Occupational History  . Occupation: retired-  [employer]    Employer: RETIRED  Tobacco Use  . Smoking status: Never Smoker  . Smokeless tobacco: Never Used  Vaping Use  . Vaping Use: Never used  Substance and Sexual Activity  . Alcohol use: Yes    Alcohol/week: 3.0 standard drinks    Types: 3 Glasses of wine per week    Comment: wine  . Drug use: No  . Sexual activity: Not on file  Other Topics Concern  . Not on file  Social History Narrative   Born in [place], raised in [place], moved to [place] in the 60s   Household: pt and wife   Son lives in the [place] Strain:   . Difficulty of Paying Living Expenses:   Food Insecurity:   . Worried About Charity fundraiser in the Last Year:   . Arboriculturist in the  Last Year:   Transportation Needs:   . Film/video editor (Medical):   Marland Kitchen Lack of Transportation (Non-Medical):   Physical Activity:   . Days of Exercise per Week:   . Minutes of Exercise per Session:   Stress:   . Feeling of Stress :   Social Connections:   . Frequency of Communication with Friends and Family:   . Frequency of Social Gatherings with Friends and Family:   . Attends Religious Services:   . Active Member of Clubs or Organizations:   . Attends Archivist Meetings:   Marland Kitchen Marital Status:      Family History: The patient's family history includes Anxiety disorder in an other family member; CAD in his father; Hyperlipidemia in his father; Hypertension in his father. There is no history of Colon cancer, Prostate cancer, Esophageal cancer, Rectal cancer, or Stomach cancer.  ROS:   Please see the history of present illness.    All other systems reviewed and are negative.  EKGs/Labs/Other Studies Reviewed:    The following studies were reviewed today: I discussed my findings with the patient at length   Recent Labs: 05/26/2019: Magnesium 2.0 03/15/2020: TSH 2.19 05/15/2020: ALT 16; BUN 20; Creatinine, Ser 1.12; Hemoglobin 15.5; Platelets 181; Potassium 4.3; Sodium 136  Recent Lipid Panel    Component Value Date/Time   CHOL 140 11/14/2019 0845   TRIG 73.0 11/14/2019 0845   TRIG 55 10/05/2006 1439   HDL 42.40 11/14/2019 0845   CHOLHDL 3 11/14/2019 0845   VLDL 14.6 11/14/2019 0845   LDLCALC 83 11/14/2019 0845   LDLDIRECT 153.4 06/06/2013 1156    Physical Exam:    VS:  BP 110/70   Pulse (!) 56   Ht 5\' 10"  (1.778 m)   Wt 199 lb (90.3 kg)   SpO2 97%   BMI 28.55 kg/m     Wt Readings from Last 3 Encounters:  05/21/20 199 lb (90.3 kg)  05/15/20 197 lb 4 oz (89.5 kg)  03/09/20 206 lb (93.4 kg)     GEN: Patient is in no acute distress HEENT: Normal NECK: No JVD; No carotid bruits LYMPHATICS: No lymphadenopathy CARDIAC: Hear sounds regular, 2/6  systolic murmur at the apex. RESPIRATORY:  Clear to auscultation without rales, wheezing or rhonchi  ABDOMEN: Soft, non-tender, non-distended MUSCULOSKELETAL:  No edema; No deformity  SKIN: Warm and dry NEUROLOGIC:  Alert and oriented x 3 PSYCHIATRIC:  Normal affect   Signed, Jenean Lindau, MD  05/21/2020 11:20 AM    [place]

## 2020-05-29 DIAGNOSIS — H401131 Primary open-angle glaucoma, bilateral, mild stage: Secondary | ICD-10-CM | POA: Diagnosis not present

## 2020-05-29 DIAGNOSIS — Z961 Presence of intraocular lens: Secondary | ICD-10-CM | POA: Diagnosis not present

## 2020-05-29 DIAGNOSIS — H524 Presbyopia: Secondary | ICD-10-CM | POA: Diagnosis not present

## 2020-05-29 DIAGNOSIS — H52203 Unspecified astigmatism, bilateral: Secondary | ICD-10-CM | POA: Diagnosis not present

## 2020-05-29 DIAGNOSIS — H25812 Combined forms of age-related cataract, left eye: Secondary | ICD-10-CM | POA: Diagnosis not present

## 2020-06-12 ENCOUNTER — Other Ambulatory Visit: Payer: Self-pay

## 2020-06-12 ENCOUNTER — Ambulatory Visit (INDEPENDENT_AMBULATORY_CARE_PROVIDER_SITE_OTHER): Payer: Medicare Other | Admitting: Internal Medicine

## 2020-06-12 ENCOUNTER — Encounter: Payer: Self-pay | Admitting: Internal Medicine

## 2020-06-12 VITALS — BP 126/65 | HR 45 | Temp 97.9°F | Resp 18 | Ht 70.0 in | Wt 200.4 lb

## 2020-06-12 DIAGNOSIS — R634 Abnormal weight loss: Secondary | ICD-10-CM | POA: Diagnosis not present

## 2020-06-12 DIAGNOSIS — R110 Nausea: Secondary | ICD-10-CM | POA: Diagnosis not present

## 2020-06-12 DIAGNOSIS — M542 Cervicalgia: Secondary | ICD-10-CM

## 2020-06-12 NOTE — Progress Notes (Signed)
Subjective:    Patient ID: Robert Krause, male    DOB: 11-07-1934, 86 y.o.   MRN: [id number]  DOS:  06/12/2020 Type of visit - description: Follow-up from previous visit. As far as the fatigue and weight loss things are going better. Last week, he was doing some work at home and developed pain at the left side of the neck that radiates up to the shoulder. No arm paresthesias or weakness. No injury. Pain increases when he turns his head to the left.  Wt Readings from Last 3 Encounters:  06/12/20 200 lb 6 oz (90.9 kg)  05/21/20 199 lb (90.3 kg)  05/15/20 197 lb 4 oz (89.5 kg)     Review of Systems Chronic mild nausea unchanged Denies headaches  Past Medical History:  Diagnosis Date  . Adenomatous polyp of colon 10/2005  . Allergic rhinitis   . Anxiety   . BPH (benign prostatic hyperplasia)   . Chronic interstitial cystitis    Dr Amalia Hailey  . Chronic sinusitis   . CTS (carpal tunnel syndrome) 01/27/2017  . Glaucoma   . Hyperlipidemia   . Hypertension   . Osteoarthritis    no per pt  . Venous lake    right inferior clavicle, shave    Past Surgical History:  Procedure Laterality Date  . APPENDECTOMY    . CATARACT EXTRACTION Right 06-2015  . COLONOSCOPY    . POLYPECTOMY    . TRANSURETHRAL RESECTION OF PROSTATE    . UPPER GASTROINTESTINAL ENDOSCOPY      Allergies as of 06/12/2020   No Known Allergies     Medication List       Accurate as of June 12, 2020  8:36 AM. If you have any questions, ask your nurse or doctor.        irbesartan-hydrochlorothiazide 150-12.5 MG tablet Commonly known as: AVALIDE Take 1 tablet by mouth daily.   ondansetron 4 MG tablet Commonly known as: ZOFRAN TAKE 1 TABLET(4 MG) BY MOUTH EVERY 8 HOURS AS NEEDED FOR NAUSEA OR VOMITING   pantoprazole 40 MG tablet Commonly known as: PROTONIX TAKE 1 TABLET(40 MG) BY MOUTH TWICE DAILY   pravastatin 10 MG tablet Commonly known as: PRAVACHOL Take 1 tablet (10 mg total) by mouth daily.    tamsulosin 0.4 MG Caps capsule Commonly known as: FLOMAX Take 1 capsule by mouth Daily.   timolol 0.5 % ophthalmic solution Commonly known as: TIMOPTIC PLACE 1 DROP INTO BOTH EYES TWICE DAILY          Objective:   Physical Exam BP 126/65 (BP Location: Left Arm, Patient Position: Sitting, Cuff Size: Small)   Pulse (!) 45   Temp 97.9 F (36.6 C) (Oral)   Resp 18   Ht 5\' 10"  (1.778 m)   Wt 200 lb 6 oz (90.9 kg)   SpO2 99%   BMI 28.75 kg/m  General:   Well developed, NAD, BMI noted. HEENT:  Normocephalic . Face symmetric, atraumatic Neck: No TTP at the cervical spine.  Range of motion normal but pain noted when he turns to the left or hyperextend the neck. Lower extremities: no pretibial edema bilaterally  Skin: Not pale. Not jaundice Neurologic:  alert & oriented X3.  Speech normal, gait appropriate for age and unassisted.  Motor symmetric, DTR symmetric. Psych--  Cognition and judgment appear intact.  Cooperative with normal attention span and concentration.  Behavior appropriate. No anxious or depressed appearing.      Assessment     Assessment Prediabetes  HTN CV: --Bradycardia: Holter 2016 essentially negative --06/07/2019 dx was  DOE and palpitations: ECHO unremarkable, nuclear stress test EF 45%, no ischemic changes Hyperlipidemia Anxiety:  -on xanax, stress related to interstitial cystitis - lexapro 05/2019 (s/e HA, constipation) Insomnia: On melatonin, not interested in other medications DJD CTS: B, offered surgery before  Obesity: BMI 32 Glaucoma GU: Dr. Amalia Hailey --BPH, TURP --Interstitial cystitis (on gabapentin prn) Venous lake, right inferior clavicle Chronic nausea: GB U/S 2017 (-),  saw GI, EGD 11-2018: Erosive gastritis, BX with no H. pylori, + reactive gastropathy.   CT abdomen 08-2019: Benign.   PLAN Fatigue, or weight loss: Labs from last visit were normal.  Overall without any intervention he feels better, no further weight loss, energy  level increasing.  We agreed on observation Neck pain: New issue, no radiculopathy symptoms, probably a sprain. Recommend warm compress, Tylenol (see AVS), call if not improving in the next few days, consider a round of prednisone.  Call if symptoms severe. Chronic nausea: See last visit for the summary of the work-up, issue unchange.  No headaches RTC 10/2020 CPX   This visit occurred during the SARS-CoV-2 public health emergency.  Safety protocols were in place, including screening questions prior to the visit, additional usage of staff PPE, and extensive cleaning of exam room while observing appropriate contact time as indicated for disinfecting solutions.

## 2020-06-12 NOTE — Progress Notes (Signed)
Pre visit review using our clinic review tool, if applicable. No additional management support is needed unless otherwise documented below in the visit note. 

## 2020-06-12 NOTE — Patient Instructions (Addendum)
Please schedule Medicare Wellness with Glenard Haring.    For neck pain:  Use a heating pad or warm compress 3 times a day  Tylenol  500 mg OTC 2 tabs a day every 8 hours for the next 4 to 5 days then as needed for pain  Call next week if you are no better, we could give you prednisone.  Call anytime if symptoms severe  Check the  blood pressure regularly  BP GOAL is between 110/65 and  135/85. If it is consistently higher or lower, let me know    Robert Krause, PLEASE SCHEDULE YOUR APPOINTMENTS Come back for   A physical exam by 10/2020

## 2020-06-13 NOTE — Assessment & Plan Note (Signed)
Fatigue, or weight loss: Labs from last visit were normal.  Overall without any intervention he feels better, no further weight loss, energy level increasing.  We agreed on observation Neck pain: New issue, no radiculopathy symptoms, probably a sprain. Recommend warm compress, Tylenol (see AVS), call if not improving in the next few days, consider a round of prednisone.  Call if symptoms severe. Chronic nausea: See last visit for the summary of the work-up, issue unchange.  No headaches RTC 10/2020 CPX

## 2020-07-09 ENCOUNTER — Ambulatory Visit: Payer: Medicare Other | Admitting: Gastroenterology

## 2020-07-09 ENCOUNTER — Other Ambulatory Visit: Payer: Self-pay

## 2020-07-09 ENCOUNTER — Encounter: Payer: Self-pay | Admitting: Gastroenterology

## 2020-07-09 VITALS — BP 106/56 | HR 42 | Ht 71.0 in | Wt 199.0 lb

## 2020-07-09 DIAGNOSIS — I10 Essential (primary) hypertension: Secondary | ICD-10-CM

## 2020-07-09 DIAGNOSIS — F329 Major depressive disorder, single episode, unspecified: Secondary | ICD-10-CM

## 2020-07-09 DIAGNOSIS — F419 Anxiety disorder, unspecified: Secondary | ICD-10-CM

## 2020-07-09 DIAGNOSIS — R634 Abnormal weight loss: Secondary | ICD-10-CM

## 2020-07-09 DIAGNOSIS — R110 Nausea: Secondary | ICD-10-CM

## 2020-07-09 DIAGNOSIS — E782 Mixed hyperlipidemia: Secondary | ICD-10-CM

## 2020-07-09 MED ORDER — PROMETHAZINE HCL 12.5 MG PO TABS
12.5000 mg | ORAL_TABLET | Freq: Three times a day (TID) | ORAL | 1 refills | Status: DC | PRN
Start: 2020-07-09 — End: 2021-01-08

## 2020-07-09 NOTE — Patient Instructions (Signed)
We have sent the following medications to your pharmacy for you to pick up at your convenience: phenergan.   You have been scheduled for a gastric emptying scan at [facility] on 07/20/20 at 7:30am. Please arrive at least 15 minutes prior to your appointment for registration. Please make certain not to have anything to eat or drink after midnight the night before your test. Hold all stomach medications (ex: Zofran, phenergan, Reglan) 48 hours prior to your test. If you need to reschedule your appointment, please contact radiology scheduling at [phone]. ___________________________________________________________ A gastric-emptying study measures how long it takes for food to move through your stomach. There are several ways to measure stomach emptying. In the most common test, you eat food that contains a small amount of radioactive material. A scanner that detects the movement of the radioactive material is placed over your abdomen to monitor the rate at which food leaves your stomach. This test normally takes about 4 hours to complete. ____________________________________________________________  Due to recent changes in healthcare laws, you may see the results of your imaging and laboratory studies on MyChart before your provider has had a chance to review them.  We understand that in some cases there may be results that are confusing or concerning to you. Not all laboratory results come back in the same time frame and the provider may be waiting for multiple results in order to interpret others.  Please give [place] 48 hours in order for your provider to thoroughly review all the results before contacting the office for clarification of your results.   Thank you for choosing me and [facility].  Pricilla Riffle. Dagoberto Ligas., MD., Marval Regal

## 2020-07-09 NOTE — Progress Notes (Signed)
    History of Present Illness: This is an an 86 year old male with persistent nausea.  His symptoms have persisted for about 1 year and have been associated with mild anorexia.  He has not experienced vomiting.  He has had a gradual 17 pound weight loss over 1 year.  He relates his symptoms are unchanged.  Nausea is brought on by meals and no other associated stressors.  Zofran has been ineffective.  Metoclopramide was ineffective.  CT of the abdomen and pelvis was unremarkable.  EGD as below.  Denies abdominal pain, constipation, diarrhea, change in stool caliber, melena, hematochezia,  vomiting, dysphagia, reflux symptoms, chest pain.   EGD 10/2019 - Normal esophagus. - Erosive gastropathy with no bleeding and no stigmata of recent bleeding. Biopsied. - Normal duodenal bulb and second portion of the duodenum.  Current Medications, Allergies, Past Medical History, Past Surgical History, Family History and Social History were reviewed in [employer] record.   Physical Exam: General: Well developed, well nourished, no acute distress Head: Normocephalic and atraumatic Eyes:  sclerae anicteric, EOMI Ears: Normal auditory acuity Mouth: Not examined, mask on during Covid-19 pandemic Lungs: Clear throughout to auscultation Heart: Regular rate and rhythm; no murmurs, rubs or bruits Abdomen: Soft, non tender and non distended. No masses, hepatosplenomegaly or hernias noted. Normal Bowel sounds Rectal: Not done Musculoskeletal: Symmetrical with no gross deformities  Pulses:  Normal pulses noted Extremities: No clubbing, cyanosis, edema or deformities noted Neurological: Alert oriented x 4, grossly nonfocal Psychological:  Alert and cooperative. Normal mood and affect   Assessment and Recommendations:  1.  Persistent nausea without vomiting.  Mild anorexia.  Gradual weight loss.  Erosive gastropathy. Rule out gastroparesis.  Schedule gastric emptying scan.  Continue  pantoprazole 40 mg p.o. twice daily.  Trial of Phenergan 12.5 mg p.o. 3 times daily AC as needed.  Consider head CT as next step pending above results and considering response to Phenergan.

## 2020-07-12 ENCOUNTER — Telehealth: Payer: Medicare Other

## 2020-07-12 NOTE — Chronic Care Management (AMB) (Deleted)
Chronic Care Management Pharmacy  Name: Robert Krause  MRN: [id number] DOB: [birth year]  Chief Complaint/ HPI  Robert Krause,  86 y.o. , male presents for their Initial CCM visit with the clinical pharmacist via telephone due to COVID-19 Pandemic.  PCP : Colon Branch, MD  Their chronic conditions include: {CHL AMB CHRONIC MEDICAL [phone]}  Office Visits: 06/12/20: Visit w/ Dr. Larose Kells - No med changes noted.  05/15/20: Visit w/ Dr. Larose Kells - Weight loss and symptoms of depression, but declines SSRI. No med changes noted.   03/09/20: Visit w/ Debbrah Alar, NP - L calf pain. Negative for DVT. Pt requested TSH.   Consult Visit: 07/09/20: Gertie Fey visit w/ Dr. Fuller Plan - Persistent nausea x 1 year. CT abdomen and pelvis unremarkable. Gastric emptying scan at [facility] on 07/20/20.  05/29/20: Ophthalmology visit w/ Dr. Gershon Crane - Start lantanoprost one drop in both eyes daily. Ofloxacin 0.3% 2 days prior and 3 weeks following surgery. Prednisolone 1% 2 days prior and 3 weeks following surgery.  05/21/20: Cardio visit w/ Dr. Geraldo Pitter - Patient reported to not taking alprazolam and metoclopramide. No other med changes noted. RTC 6 months.   02/21/20: Urology visit w/ Dr. Amalia Hailey - Cystoscopy performed. Urethra, Prostate, Bladder unremarkable.    Medications: Outpatient Encounter Medications as of 07/12/2020  Medication Sig  . irbesartan-hydrochlorothiazide (AVALIDE) 150-12.5 MG tablet Take 1 tablet by mouth daily.  . ondansetron (ZOFRAN) 4 MG tablet TAKE 1 TABLET(4 MG) BY MOUTH EVERY 8 HOURS AS NEEDED FOR NAUSEA OR VOMITING  . pantoprazole (PROTONIX) 40 MG tablet TAKE 1 TABLET(40 MG) BY MOUTH TWICE DAILY  . pravastatin (PRAVACHOL) 10 MG tablet Take 1 tablet (10 mg total) by mouth daily.  . promethazine (PHENERGAN) 12.5 MG tablet Take 1 tablet (12.5 mg total) by mouth every 8 (eight) hours as needed for nausea or vomiting.  . Tamsulosin HCl (FLOMAX) 0.4 MG CAPS Take 1  capsule by mouth Daily.  . timolol (TIMOPTIC) 0.5 % ophthalmic solution PLACE 1 DROP INTO BOTH EYES TWICE DAILY   No facility-administered encounter medications on file as of 07/12/2020.     Current Diagnosis/Assessment:  Goals Addressed   None     Hypertension   Followed by Cardio: Dr. Geraldo Pitter  BP goal is:  <140/90  Office blood pressures are  BP Readings from Last 3 Encounters:  07/09/20 (!) 106/56  06/12/20 126/65  05/21/20 110/70   Patient checks BP at home {CHL HP BP Monitoring [phone]} Patient home BP readings are ranging: ***  Patient has failed these meds in the past: None noted  Patient is currently controlled on the following medications:  . Irbesartan-hctz 150-12.49m daily  We discussed {CHL HP [facility] [phone]}  Plan -Continue current medications     Hyperlipidemia   LDL goal <100  Lipid Panel     Component Value Date/Time   CHOL 140 11/14/2019 0845   TRIG 73.0 11/14/2019 0845   TRIG 55 10/05/2006 1439   HDL 42.40 11/14/2019 0845   LDLCALC 83 11/14/2019 0845   LDLDIRECT 153.4 06/06/2013 1156    Hepatic Function Latest Ref Rng & Units 05/15/2020 09/01/2019 10/04/2018  Total Protein 6.0 - 8.3 g/dL 6.4 7.0 7.0  Albumin 3.5 - 5.2 g/dL 3.8 3.7 3.7  AST 0 - 37 U/L '19 17 16  ' ALT 0 - 53 U/L '16 15 16  ' Alk Phosphatase 39 - 117 U/L 59 59 61  Total Bilirubin 0.2 - 1.2 mg/dL 1.0 1.0 0.8  Bilirubin,  Direct 0.0 - 0.3 mg/dL - - 0.2     The ASCVD Risk score Mikey Bussing DC Jr., et al., 2013) failed to calculate for the following reasons:   The 2013 ASCVD risk score is only valid for ages 25 to 58   Patient has failed these meds in past: None noted  Patient is currently controlled on the following medications:  . Pravastatin 20m daily  We discussed:  {CHL HP [facility] [phone]}  Plan -Continue current medications  GERD   Followed by GGertie Fey Dr. SFuller Plan Patient has failed these meds in past: None  noted  Patient is currently {CHL Controlled/[phone]} on the following medications:  . Pantoprazole 423mtwice daily  Breakthrough Sx: Breakthrough Tx:  Triggers:  We discussed:  ***  Plan -Continue current medications   BPH   Followed by Urology: Dr. EvAmalia HaileyPSA  Date Value Ref Range Status  06/06/2013 0.77 0.10 - 4.00 ng/mL Final  10/30/2009 0.62 0.10 - 4.00 ng/mL Final  10/24/2008 0.50 0.10 - 4.00 ng/mL Final     Patient has failed these meds in past: None noted  Patient is currently controlled on the following medications:  . Tamsulosin 0.21m76maily  Plan -Continue current medications   Nausea   Followed by GasGertie Feyr. StaFuller Planatient has failed these meds in past: ondansetron, metoclopramide (ineffective) Patient is currently {CHL Controlled/[phone]} on the following medications:  Promethazine 12.5mg75me discussed:  {CHL HP [facility] Pharm[facility] [phone]}  Plan -Continue current medications   Glaucoma   Followed by Ophthalmology: Dr. ShapGershon Cranetient has failed these meds in past: None noted  Patient is currently controlled on the following medications: . Latanoprost 0.005% 1 drop both eyes nightly  Plan -Continue current medications   Meds to D/C from list Ondansetron??

## 2020-07-16 ENCOUNTER — Telehealth: Payer: Self-pay | Admitting: Internal Medicine

## 2020-07-16 NOTE — Progress Notes (Signed)
  Chronic Care Management   Outreach Note  07/16/2020 Name: Robert Krause MRN: [id number] DOB: 27-Nov-1933  Referred by: Colon Branch, MD Reason for referral : No chief complaint on file.   A second unsuccessful telephone outreach was attempted today. The patient was referred to pharmacist for assistance with care management and care coordination.  Follow Up Plan:   Carley Perdue [facility] Scheduler

## 2020-07-17 ENCOUNTER — Telehealth: Payer: Self-pay | Admitting: Internal Medicine

## 2020-07-17 NOTE — Progress Notes (Signed)
  Chronic Care Management   Note  07/17/2020 Name: Robert Krause MRN: [id number] DOB: 12/08/1933  Robert Krause is a 86 y.o. year old male who is a primary care patient of Colon Branch, MD. I reached out to Donnelly Angelica by phone today in response to a referral sent by Robert Krause's PCP, Colon Branch, MD.   [name] was given information about Chronic Care Management services today including:  1. CCM service includes personalized support from designated clinical staff supervised by his physician, including individualized plan of care and coordination with other care providers 2. 24/7 contact phone numbers for assistance for urgent and routine care needs. 3. Service will only be billed when office clinical staff spend 20 minutes or more in a month to coordinate care. 4. Only one practitioner may furnish and bill the service in a calendar month. 5. The patient may stop CCM services at any time (effective at the end of the month) by phone call to the office staff.   Patient agreed to services and verbal consent obtained.   Follow up plan:   Carley Perdue [facility] Scheduler

## 2020-07-20 ENCOUNTER — Ambulatory Visit (HOSPITAL_COMMUNITY)
Admission: RE | Admit: 2020-07-20 | Discharge: 2020-07-20 | Disposition: A | Discharge status: Home / Self Care | Payer: Medicare Other | Source: Ambulatory Visit | Attending: Gastroenterology | Admitting: Gastroenterology

## 2020-07-20 ENCOUNTER — Other Ambulatory Visit: Payer: Self-pay

## 2020-07-20 DIAGNOSIS — R634 Abnormal weight loss: Secondary | ICD-10-CM | POA: Diagnosis not present

## 2020-07-20 DIAGNOSIS — R110 Nausea: Secondary | ICD-10-CM | POA: Diagnosis not present

## 2020-07-20 DIAGNOSIS — R112 Nausea with vomiting, unspecified: Secondary | ICD-10-CM | POA: Diagnosis not present

## 2020-07-23 ENCOUNTER — Other Ambulatory Visit: Payer: Self-pay

## 2020-07-23 DIAGNOSIS — R110 Nausea: Secondary | ICD-10-CM

## 2020-07-27 ENCOUNTER — Other Ambulatory Visit (INDEPENDENT_AMBULATORY_CARE_PROVIDER_SITE_OTHER): Payer: Medicare Other

## 2020-07-27 DIAGNOSIS — R110 Nausea: Secondary | ICD-10-CM | POA: Diagnosis not present

## 2020-07-27 LAB — CREATININE, SERUM: Creatinine, Ser: 1.03 mg/dL (ref 0.40–1.50)

## 2020-07-27 LAB — BUN: BUN: 15 mg/dL (ref 6–23)

## 2020-07-31 ENCOUNTER — Other Ambulatory Visit: Payer: Self-pay

## 2020-07-31 ENCOUNTER — Ambulatory Visit (INDEPENDENT_AMBULATORY_CARE_PROVIDER_SITE_OTHER)
Admission: RE | Admit: 2020-07-31 | Discharge: 2020-07-31 | Disposition: A | Discharge status: Home / Self Care | Payer: Medicare Other | Source: Ambulatory Visit | Attending: Gastroenterology | Admitting: Gastroenterology

## 2020-07-31 DIAGNOSIS — R634 Abnormal weight loss: Secondary | ICD-10-CM | POA: Diagnosis not present

## 2020-07-31 DIAGNOSIS — R110 Nausea: Secondary | ICD-10-CM

## 2020-07-31 MED ORDER — IOHEXOL 300 MG/ML  SOLN
80.0000 mL | Freq: Once | INTRAMUSCULAR | Status: AC | PRN
  Administered 2020-07-31: 80 mL via INTRAVENOUS

## 2020-08-21 ENCOUNTER — Telehealth: Payer: Self-pay | Admitting: Pharmacist

## 2020-08-21 NOTE — Progress Notes (Signed)
    Chronic Care Management Pharmacy Assistant   Name: Robert Krause  MRN: [id number] DOB: [birth year]  Reason for Encounter: Initial Questions  PCP : Colon Branch, MD  Allergies:  No Known Allergies  Medications: Outpatient Encounter Medications as of 08/21/2020  Medication Sig  . irbesartan-hydrochlorothiazide (AVALIDE) 150-12.5 MG tablet Take 1 tablet by mouth daily.  . ondansetron (ZOFRAN) 4 MG tablet TAKE 1 TABLET(4 MG) BY MOUTH EVERY 8 HOURS AS NEEDED FOR NAUSEA OR VOMITING  . pantoprazole (PROTONIX) 40 MG tablet TAKE 1 TABLET(40 MG) BY MOUTH TWICE DAILY  . pravastatin (PRAVACHOL) 10 MG tablet Take 1 tablet (10 mg total) by mouth daily.  . promethazine (PHENERGAN) 12.5 MG tablet Take 1 tablet (12.5 mg total) by mouth every 8 (eight) hours as needed for nausea or vomiting.  . Tamsulosin HCl (FLOMAX) 0.4 MG CAPS Take 1 capsule by mouth Daily.  . timolol (TIMOPTIC) 0.5 % ophthalmic solution PLACE 1 DROP INTO BOTH EYES TWICE DAILY   No facility-administered encounter medications on file as of 08/21/2020.    Current Diagnosis: Patient Active Problem List   Diagnosis Date Noted  . Anxiety and depression 05/29/2019  . CTS (carpal tunnel syndrome)-- B , was rec surgery before 01/27/2017  . Abdominal pain, right upper quadrant 04/24/2016  . PCP NOTES >>>>>> 11/08/2015  . Annual physical exam >>>>>>>>>>>>>>>>>>> 07/18/2014  . Colon polyp 04/14/2011  . 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 ACTINIC KERATOSIS, CHEEK, LEFT 08/28/2009  . PROSTATITIS, ACUTE, CHRONIC 03/26/2009  . FINGER SUP FB W/O MAJ OPEN WOUND&W/O MENTION INF 02/11/2008  . Hyperlipidemia 09/28/2007  . Essential hypertension 09/28/2007  . OTHER CHRONIC SINUSITIS 09/28/2007  . ALLERGIC RHINITIS 09/28/2007  . CYSTITIS, CHRONIC INTERSTITIAL -- xanax prn, Dr Amalia Hailey  09/28/2007  . OSTEOARTHRITIS 09/28/2007    Goals Addressed   None     Chronic Care Management   Outreach Note  08/23/2020 Name: Robert Krause MRN: [id number] DOB: [birth year]  Referred by: Colon Branch, MD Reason for referral : Chronic Care Management   Third unsuccessful telephone outreach was attempted today. The patient was referred to the pharmacist for assistance with care management and care coordination.    Follow-Up:  Pharmacist Review   Fanny Skates, [place] Pharmacist Assistant [phone]

## 2020-08-24 ENCOUNTER — Ambulatory Visit: Payer: Medicare Other | Admitting: Pharmacist

## 2020-08-24 ENCOUNTER — Other Ambulatory Visit: Payer: Self-pay

## 2020-08-24 DIAGNOSIS — E782 Mixed hyperlipidemia: Secondary | ICD-10-CM

## 2020-08-24 DIAGNOSIS — I10 Essential (primary) hypertension: Secondary | ICD-10-CM

## 2020-08-24 NOTE — Chronic Care Management (AMB) (Signed)
Chronic Care Management Pharmacy  Name: Robert Krause  MRN: [id number] DOB: 07/09/1934  Chief Complaint/ HPI  Robert Krause,  86 y.o. , male presents for their Initial CCM visit with the clinical pharmacist via telephone due to COVID-19 Pandemic.  PCP : Robert Branch, MD  Their chronic conditions include: Hypertension, Hyperlipidemia, BPH, GERD  Office Visits: 06/12/20: Visit w/ Dr. Larose Krause - Weight loss stable. No med changes noted.  05/15/20: Visit w/ Dr. Larose Krause - Weight loss. Labs ordered. No med changes noted.   Consult Visit: 07/09/20: Robert Krause visit w/ Dr. Fuller Krause - Persistent nausea x 1 year with mild anorexia. Zofran and metoclopramide ineffective. Schedule gastric emptying scan. Continue pantoprazole 41m BID. Trial of phenergan 12.522mTIDPRN.   05/29/20: Ophthalmologist visit w/ Dr. ShGershon Krause Glaucoma and Cataract evaluation. Start Latanoprost drops, Prednisolone 1% 1 drop TID 2 days prior and 3 weeks following surgery, Ofloxacin 0.3% 1 trop TID 2 days prior and 3 weeks following surgery  05/21/20: Cardio visit w/ Dr. ReGeraldo Krause No med changes noted  Medications: Outpatient Encounter Medications as of 08/24/2020  Medication Sig Note   irbesartan-hydrochlorothiazide (AVALIDE) 150-12.5 MG tablet Take 1 tablet by mouth daily.    latanoprost (XALATAN) 0.005 % ophthalmic solution Place 1 drop into both eyes at bedtime.    pravastatin (PRAVACHOL) 10 MG tablet Take 1 tablet (10 mg total) by mouth daily.    Tamsulosin HCl (FLOMAX) 0.4 MG CAPS Take 1 capsule by mouth Daily.    ondansetron (ZOFRAN) 4 MG tablet TAKE 1 TABLET(4 MG) BY MOUTH EVERY 8 HOURS AS NEEDED FOR NAUSEA OR VOMITING 08/24/2020: Last taken last week   pantoprazole (PROTONIX) 40 MG tablet TAKE 1 TABLET(40 MG) BY MOUTH TWICE DAILY (Patient not taking: Reported on 08/24/2020)    promethazine (PHENERGAN) 12.5 MG tablet Take 1 tablet (12.5 mg total) by mouth every 8 (eight) hours as needed for nausea or vomiting. (Patient  not taking: Reported on 08/24/2020)    timolol (TIMOPTIC) 0.5 % ophthalmic solution PLACE 1 DROP INTO BOTH EYES TWICE DAILY (Patient not taking: Reported on 08/24/2020) 08/24/2020: Replaced by latanoprost   No facility-administered encounter medications on file as of 08/24/2020.   SDOH Screenings   Alcohol Screen:    Last Alcohol Screening Score (AUDIT): Not on file  Depression (PHQ2-9): Medium Risk   PHQ-2 Score: 9  Financial Resource Strain: Low Risk    Difficulty of Paying Living Expenses: Not very hard  Food Insecurity:    Worried About [name] the Last Year: Not on file   [name]: Not on file  Housing:    Last Housing Risk Score: Not on file  Physical Activity:    Days of Exercise per Week: Not on file   Minutes of Exercise per Session: Not on file  Social Connections:    Frequency of Communication with Friends and Family: Not on file   Frequency of Social Gatherings with Friends and Family: Not on file   Attends Religious Services: Not on file   Active Member of Clubs or Organizations: Not on file   Attends ClArchivisteetings: Not on file   Marital Status: Not on file  Stress:    Feeling of Stress : Not on file  Tobacco Use: Low Risk    Smoking Tobacco Use: Never Smoker   Smokeless Tobacco Use: Never Used  Transportation Needs:    LaFilm/video editorMedical): Not on file  Lack of Transportation (Non-Medical): Not on file     Current Diagnosis/Assessment:  Goals Addressed            This Visit's Progress    Chronic Care Management Pharmacy Care Krause       CARE Krause ENTRY (see longitudinal Krause of care for additional care Krause information)  Current Barriers:   Chronic Disease Management support, education, and care coordination needs related to Hypertension, Hyperlipidemia, BPH, GERD   Hypertension BP Readings from Last 3 Encounters:  07/09/20 (!) 106/56  06/12/20 126/65  05/21/20  110/70    Pharmacist Clinical Goal(s): o Over the next 90 days, patient will work with PharmD and providers to maintain BP goal <140/90  Current regimen:  o Irbesartan-hctz 150-12.34m daily   Interventions: o Discussed BP goals  Patient self care activities - Over the next 90 days, patient will: o Check BP 1-2 times per week, document, and provide at future appointments o Ensure daily salt intake < 2300 mg/Robert Krause o Maintain hypertension medication regimen.  Hyperlipidemia Lab Results  Component Value Date/Time   LDLCALC 83 11/14/2019 08:45 AM   LDLDIRECT 153.4 06/06/2013 11:56 AM    Pharmacist Clinical Goal(s): o Over the next 90 days, patient will work with PharmD and providers to maintain LDL goal < 100  Current regimen:  o Pravastatin 171mdaily HS  Interventions: o Discussed LDL goal  Patient self care activities - Over the next 90 days, patient will: o Maintain cholesterol medication regimen.   Medication management  Pharmacist Clinical Goal(s): o Over the next 90 days, patient will work with PharmD and providers to maintain optimal medication adherence  Current pharmacy: [facility]  Interventions o Comprehensive medication review performed. o Continue current medication management strategy  Patient self care activities - Over the next 90 days, patient will: o Focus on medication adherence by filling and taking medications appropriately  o Take medications as prescribed o Report any questions or concerns to PharmD and/or provider(s)  Initial goal documentation      Social Hx:  Married.  One Son. One grand daughter. No pets.   Hypertension   BP goal is:  <140/90  Office blood pressures are  BP Readings from Last 3 Encounters:  07/09/20 (!) 106/56  06/12/20 126/65  05/21/20 110/70   Patient checks BP at home 1-2x per week Patient home BP readings are ranging: Unable to assess...reports 132/60s  Patient has failed these meds in the past: None  noted  Patient is currently controlled on the following medications:   Irbesartan-hctz 150-12.32m52maily (AM - BB)  Denies headaches, chest pain, dizziness  We discussed BP goals  Krause -Continue current medications     Hyperlipidemia   LDL goal < 100  Lipid Panel     Component Value Date/Time   CHOL 140 11/14/2019 0845   TRIG 73.0 11/14/2019 0845   TRIG 55 10/05/2006 1439   HDL 42.40 11/14/2019 0845   LDLCALC 83 11/14/2019 0845   LDLDIRECT 153.4 06/06/2013 1156    Hepatic Function Latest Ref Rng & Units 05/15/2020 09/01/2019 10/04/2018  Total Protein 6.0 - 8.3 g/dL 6.4 7.0 7.0  Albumin 3.5 - 5.2 g/dL 3.8 3.7 3.7  AST 0 - 37 U/L _0 ALT 0 - 53 U/L _1 Alk Phosphatase 39 - 117 U/L 59 59 61  Total Bilirubin 0.2 - 1.2 mg/dL 1.0 1.0 0.8  Bilirubin, Direct 0.0 - 0.3 mg/dL - - 0.2     The ASCVD  Risk score Mikey Bussing DC Jr., et al., 2013) failed to calculate for the following reasons:   The 2013 ASCVD risk score is only valid for ages 11 to 66   Patient has failed these meds in past: None noted  Patient is currently controlled on the following medications:   Pravastatin 76m daily HS  We discussed:  LDL goal  Krause -Continue current medications  BPH   PSA  Date Value Ref Range Status  06/06/2013 0.77 0.10 - 4.00 ng/mL Final  10/30/2009 0.62 0.10 - 4.00 ng/mL Final  10/24/2008 0.50 0.10 - 4.00 ng/mL Final     Patient has failed these meds in past: dutasteride, silodosin (listed in D/C meds. No apparent reason for D/C) Patient is currently controlled on the following medications:   Tamsulosin 0.445mdaily HS  "I have a lot of problems"  Feels like he isn't fully emptying. Does report having a stream. Gets up about 2 times a night to urinate.   Krause -Continue current medications   GERD   Patient has failed these meds in past: None noted  Patient is currently controlled on the following medications:   Pantoprazole 4032mwice daily (states he stopped  taking a couple of months ago, feels like it caused him constipation. Didn't feel like it worked)  Krause -Continue current regimen  Vaccines   Reviewed and discussed patient's vaccination history.    Immunization History  Administered Date(s) Administered   Fluad Quad(high Dose 65+) 07/27/2019   Influenza Split 09/05/2011   Influenza Whole 08/24/2008, 08/24/2009, 10/02/2010   Influenza, High Dose Seasonal PF 08/20/2018, 08/17/2020   Influenza, Seasonal, Injecte, Preservative Fre 08/16/2013   Influenza,inj,Quad PF,6+ Mos 07/24/2015   Influenza-Unspecified 10/04/2016, 07/28/2017   PFIZER SARS-COV-2 Vaccination 12/08/2019, 12/26/2019, 09/10/2020   Pneumococcal Conjugate-13 07/18/2014, 03/29/2015   Pneumococcal Polysaccharide-23 09/25/2003   Td 09/24/2006, 07/08/2016   Zoster 11/24/2012   Zoster Recombinat (Shingrix) 03/03/2017, 06/08/2017   [facility]   Medication Management   Pt [facility] Uses pill box? No - doesn't feel he needs a pill box Pt endorses 100% compliance  Miscellaneous Meds Ondansetron 4mg70momethazine 12.5mg 55molol 0.5% ophthalmic soln  Krause  Continue current medication management strategy  Follow up: 6 month phone visit  KanesDe BlanchrmD Clinical Pharmacist [phone]

## 2020-09-12 NOTE — Patient Instructions (Addendum)
Visit Information  Goals Addressed            This Visit's Progress   . Chronic Care Management Pharmacy Care Plan       CARE PLAN ENTRY (see longitudinal plan of care for additional care plan information)  Current Barriers:  . Chronic Disease Management support, education, and care coordination needs related to Hypertension, Hyperlipidemia, BPH, GERD   Hypertension BP Readings from Last 3 Encounters:  07/09/20 (!) 106/56  06/12/20 126/65  05/21/20 110/70   . Pharmacist Clinical Goal(s): o Over the next 90 days, patient will work with PharmD and providers to maintain BP goal <140/90 . Current regimen:  o Irbesartan-hctz 150-12.5mg  daily  . Interventions: o Discussed BP goals . Patient self care activities - Over the next 90 days, patient will: o Check BP 1-2 times per week, document, and provide at future appointments o Ensure daily salt intake < 2300 mg/Robert Krause o Maintain hypertension medication regimen.  Hyperlipidemia Lab Results  Component Value Date/Time   LDLCALC 83 11/14/2019 08:45 AM   LDLDIRECT 153.4 06/06/2013 11:56 AM   . Pharmacist Clinical Goal(s): o Over the next 90 days, patient will work with PharmD and providers to maintain LDL goal < 100 . Current regimen:  o Pravastatin 10mg  daily HS . Interventions: o Discussed LDL goal . Patient self care activities - Over the next 90 days, patient will: o Maintain cholesterol medication regimen.   Medication management . Pharmacist Clinical Goal(s): o Over the next 90 days, patient will work with PharmD and providers to maintain optimal medication adherence . Current pharmacy: [facility] . Interventions o Comprehensive medication review performed. o Continue current medication management strategy . Patient self care activities - Over the next 90 days, patient will: o Focus on medication adherence by filling and taking medications appropriately  o Take medications as prescribed o Report any questions or  concerns to PharmD and/or provider(s)  Initial goal documentation        Robert Krause was given information about Chronic Care Management services today including:  1. CCM service includes personalized support from designated clinical staff supervised by his physician, including individualized plan of care and coordination with other care providers 2. 24/7 contact phone numbers for assistance for urgent and routine care needs. 3. Standard insurance, coinsurance, copays and deductibles apply for chronic care management only during months in which we provide at least 20 minutes of these services. Most insurances cover these services at 100%, however patients may be responsible for any copay, coinsurance and/or deductible if applicable. This service may help you avoid the need for more expensive face-to-face services. 4. Only one practitioner may furnish and bill the service in a calendar month. 5. The patient may stop CCM services at any time (effective at the end of the month) by phone call to the office staff.  Patient agreed to services and verbal consent obtained.   The patient verbalized understanding of instructions provided today and agreed to receive a mailed copy of patient instruction and/or educational materials. Telephone follow up appointment with pharmacy team member scheduled for: 02/22/2021  Robert Krause, PharmD Clinical Pharmacist [facility] [phone]

## 2020-09-18 DIAGNOSIS — N3010 Interstitial cystitis (chronic) without hematuria: Secondary | ICD-10-CM | POA: Diagnosis not present

## 2020-09-20 ENCOUNTER — Other Ambulatory Visit: Payer: Self-pay | Admitting: Internal Medicine

## 2020-11-12 ENCOUNTER — Other Ambulatory Visit: Payer: Self-pay

## 2020-11-12 ENCOUNTER — Ambulatory Visit: Payer: Medicare Other | Admitting: *Deleted

## 2020-11-12 ENCOUNTER — Ambulatory Visit (INDEPENDENT_AMBULATORY_CARE_PROVIDER_SITE_OTHER): Payer: Medicare Other | Admitting: Internal Medicine

## 2020-11-12 ENCOUNTER — Encounter: Payer: Self-pay | Admitting: Internal Medicine

## 2020-11-12 VITALS — BP 147/83 | HR 50 | Temp 98.0°F | Resp 18 | Ht 71.0 in | Wt 193.5 lb

## 2020-11-12 DIAGNOSIS — R110 Nausea: Secondary | ICD-10-CM

## 2020-11-12 DIAGNOSIS — Z0000 Encounter for general adult medical examination without abnormal findings: Secondary | ICD-10-CM | POA: Diagnosis not present

## 2020-11-12 DIAGNOSIS — I10 Essential (primary) hypertension: Secondary | ICD-10-CM

## 2020-11-12 DIAGNOSIS — M542 Cervicalgia: Secondary | ICD-10-CM

## 2020-11-12 DIAGNOSIS — E782 Mixed hyperlipidemia: Secondary | ICD-10-CM

## 2020-11-12 DIAGNOSIS — R7989 Other specified abnormal findings of blood chemistry: Secondary | ICD-10-CM | POA: Diagnosis not present

## 2020-11-12 HISTORY — DX: Nausea: R11.0

## 2020-11-12 LAB — TSH: TSH: 2.24 u[IU]/mL (ref 0.35–4.50)

## 2020-11-12 LAB — COMPREHENSIVE METABOLIC PANEL
ALT: 14 U/L (ref 0–53)
AST: 17 U/L (ref 0–37)
Albumin: 3.7 g/dL (ref 3.5–5.2)
Alkaline Phosphatase: 58 U/L (ref 39–117)
BUN: 13 mg/dL (ref 6–23)
CO2: 31 mEq/L (ref 19–32)
Calcium: 8.9 mg/dL (ref 8.4–10.5)
Chloride: 102 mEq/L (ref 96–112)
Creatinine, Ser: 1 mg/dL (ref 0.40–1.50)
GFR: 69.33 mL/min (ref >60.00)
Glucose, Bld: 104 mg/dL — ABNORMAL HIGH (ref 70–99)
Potassium: 4.3 mEq/L (ref 3.5–5.1)
Sodium: 137 mEq/L (ref 135–145)
Total Bilirubin: 1.1 mg/dL (ref 0.2–1.2)
Total Protein: 6.5 g/dL (ref 6.0–8.3)

## 2020-11-12 LAB — LIPID PANEL
Cholesterol: 142 mg/dL (ref 0–200)
HDL: 47.9 mg/dL (ref >39.00)
LDL Cholesterol: 80 mg/dL (ref 0–99)
NonHDL: 94.13
Total CHOL/HDL Ratio: 3
Triglycerides: 72 mg/dL (ref 0.0–149.0)
VLDL: 14.4 mg/dL (ref 0.0–40.0)

## 2020-11-12 LAB — T3, FREE: T3, Free: 2.9 pg/mL (ref 2.3–4.2)

## 2020-11-12 LAB — T4, FREE: Free T4: 0.86 ng/dL (ref 0.60–1.60)

## 2020-11-12 MED ORDER — PREDNISONE 10 MG PO TABS: ORAL_TABLET | ORAL | 0 refills | Status: DC

## 2020-11-12 NOTE — Progress Notes (Signed)
Pre visit review using our clinic review tool, if applicable. No additional management support is needed unless otherwise documented below in the visit note. 

## 2020-11-12 NOTE — Assessment & Plan Note (Signed)
-  Td :2017  - pnm 23: 2004,  booster 2016; prevanr  2015 - zostavax-- 2014 per pt - s/p  shingrix (x2) -COVID vaccine x3 - had a flu shot  - CCS   cscope 2012, Cscope 12/2017, next per GI  -Prostate ca screening-- per urology -Diet exercise: Counseled  -Labs: CMP, FLP, TSH, free T3, free T4

## 2020-11-12 NOTE — Assessment & Plan Note (Signed)
Here for CPX Prediabetes: Last A1c satisfactory GOV:PCHEK well controlled on Avalide.  Ambulatory BPs never more than 130.  Checking labs High cholesterol: On Pravachol, checking labs Fatigue, weight loss: Weight has decreased compared to previous months, patient thinks is due to nausea (not eating as much).  Checking TFTs today. Slightly decreased TSH: Per chart review, check TFTs Chronic nausea: GI Rx gastric emptying study, normal (July 20, 2020) and CT head 07-2020: Normal.  At this point sx is chronic, unchanged, sometimes takes Zofran or Phenergan.  To see GI again in few months. Neck pain: As described above, no radicular symptoms.  Plan: Round of prednisone, call if not better, continue Tylenol.  Watch for CBGs, stop prednisone if they are more than 180. RTC 4 months

## 2020-11-12 NOTE — Progress Notes (Signed)
Subjective:    Patient ID: Robert Krause, male    DOB: 29-Nov-1933, 86 y.o.   MRN: [id number]  DOS:  11/12/2020 Type of visit - description: CPX Since the last office visit is doing about the same. Some weight loss noted. Continue with nausea, unchanged for more than a year. Continue with neck pain, on and off, radiates to the upper trapezoid with neck motion.  No upper or lower extremity paresthesias, no gait disorder. GU symptoms at baseline.  Wt Readings from Last 3 Encounters:  11/12/20 193 lb 8 oz (87.8 kg)  07/09/20 199 lb (90.3 kg)  06/12/20 200 lb 6 oz (90.9 kg)    Review of Systems  Other than above, a 14 point review of systems is negative     Past Medical History:  Diagnosis Date  . Adenomatous polyp of colon 10/2005  . Allergic rhinitis   . Anxiety   . BPH (benign prostatic hyperplasia)   . Chronic interstitial cystitis    Dr Amalia Hailey  . Chronic sinusitis   . CTS (carpal tunnel syndrome) 01/27/2017  . Glaucoma   . Hyperlipidemia   . Hypertension   . Osteoarthritis    no per pt  . Venous [place]    right inferior clavicle, shave    Past Surgical History:  Procedure Laterality Date  . APPENDECTOMY    . CATARACT EXTRACTION Right 06-2015  . COLONOSCOPY    . POLYPECTOMY    . TRANSURETHRAL RESECTION OF PROSTATE    . UPPER GASTROINTESTINAL ENDOSCOPY      Allergies as of 11/12/2020   No Known Allergies     Medication List       Accurate as of November 12, 2020  8:37 PM. If you have any questions, ask your nurse or doctor.        STOP taking these medications   timolol 0.5 % ophthalmic solution Commonly known as: TIMOPTIC Stopped by: Kathlene November, MD     TAKE these medications   irbesartan-hydrochlorothiazide 150-12.5 MG tablet Commonly known as: AVALIDE Take 1 tablet by mouth daily.   latanoprost 0.005 % ophthalmic solution Commonly known as: XALATAN Place 1 drop into both eyes at bedtime.   ondansetron 4 MG tablet Commonly known as:  ZOFRAN TAKE 1 TABLET(4 MG) BY MOUTH EVERY 8 HOURS AS NEEDED FOR NAUSEA OR VOMITING   pantoprazole 40 MG tablet Commonly known as: PROTONIX TAKE 1 TABLET(40 MG) BY MOUTH TWICE DAILY   pravastatin 10 MG tablet Commonly known as: PRAVACHOL Take 1 tablet (10 mg total) by mouth daily.   predniSONE 10 MG tablet Commonly known as: DELTASONE 4 tablets x 2 days, 3 tabs x 2 days, 2 tabs x 2 days, 1 tab x 2 days Started by: Kathlene November, MD   promethazine 12.5 MG tablet Commonly known as: PHENERGAN Take 1 tablet (12.5 mg total) by mouth every 8 (eight) hours as needed for nausea or vomiting.   tamsulosin 0.4 MG Caps capsule Commonly known as: FLOMAX Take 1 capsule by mouth Daily.          Objective:   Physical Exam BP (!) 147/83 (BP Location: Left Arm, Patient Position: Sitting, Cuff Size: Normal)   Pulse (!) 50   Temp 98 F (36.7 C) (Oral)   Resp 18   Ht 5\' 11"  (1.803 m)   Wt 193 lb 8 oz (87.8 kg)   SpO2 99%   BMI 26.99 kg/m  General: Well developed, NAD, BMI noted Neck: No  thyromegaly. Cervical  spine no TTP. He reported some pain when he turned to the left.  Range of motion seem appropriate HEENT:  Normocephalic . Face symmetric, atraumatic Lungs:  CTA B Normal respiratory effort, no intercostal retractions, no accessory muscle use. Heart: Bradycardia, no murmur.  Abdomen:  Not distended, soft, non-tender. No rebound or rigidity.   Lower extremities: no pretibial edema bilaterally  Skin: Exposed areas without rash. Not pale. Not jaundice Neurologic:  alert & oriented X3.  Speech normal, gait appropriate for age and unassisted Strength symmetric and appropriate for age.  DTR symmetric.  Psych: Cognition and judgment appear intact.  Cooperative with normal attention span and concentration.  Behavior appropriate. No anxious or depressed appearing.     Assessment     Assessment Prediabetes HTN CV: --Bradycardia: Holter 2016 essentially negative --06/07/2019 dx  was  DOE and palpitations: ECHO unremarkable, nuclear stress test EF 45%, no ischemic changes Hyperlipidemia Anxiety:  -on xanax, stress related to interstitial cystitis - lexapro 05/2019 (s/e HA, constipation) Insomnia: On melatonin, not interested in other medications DJD CTS: B, offered surgery before  Obesity: BMI 32 Glaucoma GU: Dr. Amalia Hailey --BPH, TURP --Interstitial cystitis (on gabapentin prn) Venous lake, right inferior clavicle Chronic nausea: GB U/S 2017 (-),  saw GI, EGD 11-2018: Erosive gastritis, BX with no H. pylori, + reactive gastropathy.   CT abdomen 08-2019: Benign.   PLAN Here for CPX Prediabetes: Last A1c satisfactory YBO:FBPZW well controlled on Avalide.  Ambulatory BPs never more than 130.  Checking labs High cholesterol: On Pravachol, checking labs Fatigue, weight loss: Weight has decreased compared to previous months, patient thinks is due to nausea (not eating as much).  Checking TFTs today. Slightly decreased TSH: Per chart review, check TFTs Chronic nausea: GI Rx gastric emptying study, normal (July 20, 2020) and CT head 07-2020: Normal.  At this point sx is chronic, unchanged, sometimes takes Zofran or Phenergan.  To see GI again in few months. Neck pain: As described above, no radicular symptoms.  Plan: Round of prednisone, call if not better, continue Tylenol.  Watch for CBGs, stop prednisone if they are more than 180. RTC 4 months  In addition to CPX we address his chronic issues including prediabetes, hypertension, high cholesterol, weight loss, nausea and also neck pain.  This visit occurred during the SARS-CoV-2 public health emergency.  Safety protocols were in place, including screening questions prior to the visit, additional usage of staff PPE, and extensive cleaning of exam room while observing appropriate contact time as indicated for disinfecting solutions.

## 2020-11-12 NOTE — Patient Instructions (Addendum)
Check the  blood pressure regularly BP GOAL is between 110/65 and  135/85. If it is consistently higher or lower, let me know   For neck pain: Take  prednisone, and anti-inflammatory, call if not better.   GO TO THE LAB : Get the blood work     [place], [place] back for a checkup in 4 months

## 2020-12-06 ENCOUNTER — Telehealth: Payer: Self-pay | Admitting: Pharmacist

## 2020-12-06 NOTE — Progress Notes (Addendum)
Chronic Care Management Pharmacy Assistant   Name: Robert Krause  MRN: [id number] DOB: Mar 19, 1934  Reason for Encounter: HTN Disease State  Patient Questions:  1.  Have you seen any other providers since your last visit? Yes  2.  Any changes in your medicines or health? Yes  PCP : Colon Branch, MD   Their chronic conditions include: Hypertension, Hyperlipidemia, BPH, GERD.  Office Visits: 11-12-2020 (PCP) Patient presented in the office for his annual physical exam. Noted patient's complaints have remained the same with nausea, weight loss, neck pain. Was prescribed Prednisone 10 mg tabs for neck pain. Timolol eye drop was discontinued.   Consults: 09-18-2020 (Urology) Patient presented in the office for his follow up. The following medications were discontinued: Olmesartan 40 mg, Gabapentin 300 mg, Irbesartan-HCTZ 150-12.5 mg   Allergies:  No Known Allergies  Medications: Outpatient Encounter Medications as of 12/06/2020  Medication Sig   irbesartan-hydrochlorothiazide (AVALIDE) 150-12.5 MG tablet Take 1 tablet by mouth daily.   latanoprost (XALATAN) 0.005 % ophthalmic solution Place 1 drop into both eyes at bedtime.   ondansetron (ZOFRAN) 4 MG tablet TAKE 1 TABLET(4 MG) BY MOUTH EVERY 8 HOURS AS NEEDED FOR NAUSEA OR VOMITING (Patient not taking: Reported on 11/12/2020)   pantoprazole (PROTONIX) 40 MG tablet TAKE 1 TABLET(40 MG) BY MOUTH TWICE DAILY (Patient not taking: No sig reported)   pravastatin (PRAVACHOL) 10 MG tablet Take 1 tablet (10 mg total) by mouth daily.   predniSONE (DELTASONE) 10 MG tablet 4 tablets x 2 days, 3 tabs x 2 days, 2 tabs x 2 days, 1 tab x 2 days   promethazine (PHENERGAN) 12.5 MG tablet Take 1 tablet (12.5 mg total) by mouth every 8 (eight) hours as needed for nausea or vomiting. (Patient not taking: No sig reported)   Tamsulosin HCl (FLOMAX) 0.4 MG CAPS Take 1 capsule by mouth Daily.   No facility-administered encounter medications on file as  of 12/06/2020.    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 Spoke with patient regarding BP  Recent Office Vitals: BP Readings from Last 3 Encounters:  11/12/20 (!) 147/83  07/09/20 (!) 106/56  06/12/20 126/65   Pulse Readings from Last 3 Encounters:  11/12/20 (!) 50  07/09/20 (!) 42  06/12/20 (!) 45    Wt Readings from Last 3 Encounters:  11/12/20 193 lb 8 oz (87.8 kg)  07/09/20 199 lb (90.3 kg)  06/12/20 200 lb 6 oz (90.9 kg)     Kidney Function Lab Results  Component Value Date/Time   CREATININE 1.00 11/12/2020 09:28 AM   CREATININE 1.03 07/27/2020 10:51 AM   GFR 69.33 11/12/2020 09:28 AM   GFRNONAA 63.07  10/30/2009 09:28 AM   GFRAA 85 10/24/2008 12:00 AM    BMP Latest Ref Rng & Units 11/12/2020 07/27/2020 05/15/2020  Glucose 70 - 99 mg/dL 104(H) - 115(H)  BUN 6 - 23 mg/dL 13 15 20   Creatinine 0.40 - 1.50 mg/dL 1.00 1.03 1.12  Sodium 135 - 145 mEq/L 137 - 136  Potassium 3.5 - 5.1 mEq/L 4.3 - 4.3  Chloride 96 - 112 mEq/L 102 - 101  CO2 19 - 32 mEq/L 31 - 32  Calcium 8.4 - 10.5 mg/dL 8.9 - 8.9    Current antihypertensive regimen:  Irbesartan-hctz  150-12.5 mg daily (was d/c'd 09/18/20. Pt reported still taking)  How often are you checking your Blood Pressure? daily   Current home BP readings: Did not have actual readings at the time of the call. Stated his BP is around 132/80.  What recent interventions/DTPs have been made by any provider to improve Blood Pressure control since last CPP Visit: none. Looks like patient was taken off of all HTN medication.  Any recent hospitalizations or ED visits since last visit with CPP? No   What diet changes have been made to improve Blood Pressure Control?  None  What exercise is being done to improve your Blood Pressure Control?  None  Adherence Review: Is the patient currently on ACE/ARB medication? Yes Irbesartan-hctz 150-12.5mg  daily  Does the patient have >5 day gap between last estimated fill dates? No CPP please confirm   Follow-Up:  Pharmacist Review   Fanny Skates, [place] Pharmacist Assistant [phone]  7 minutes spent in review, coordination, and documentation.  Reviewed by: Beverly Milch, PharmD Clinical Pharmacist [facility] [phone]

## 2020-12-14 ENCOUNTER — Telehealth: Payer: Self-pay | Admitting: Pharmacist

## 2020-12-14 NOTE — Progress Notes (Signed)
    Chronic Care Management Pharmacy Assistant   Name: Robert Krause  MRN: [id number] DOB: 1934/06/19  Reason for Encounter: Adherence Review.  Patient Questions:  1.  Have you seen any other providers since your last visit? No.   2.  Any changes in your medicines or health? No.   PCP : Colon Branch, MD  Verified Adherence Gap Information. Per insurance data, the patient is 100% compliant with Pravastatin 10 mg medication and 90-99% compliant with Irbesar/Hctz 150-12.5 mg. The patient did not met their annual wellness and wellness bundle screening.   Follow-Up:  Pharmacist Review

## 2020-12-21 ENCOUNTER — Other Ambulatory Visit: Payer: Self-pay | Admitting: Internal Medicine

## 2021-01-01 ENCOUNTER — Ambulatory Visit: Payer: Self-pay

## 2021-01-01 ENCOUNTER — Ambulatory Visit: Payer: Medicare Other | Admitting: Orthopaedic Surgery

## 2021-01-01 ENCOUNTER — Encounter: Payer: Self-pay | Admitting: Orthopaedic Surgery

## 2021-01-01 DIAGNOSIS — G5602 Carpal tunnel syndrome, left upper limb: Secondary | ICD-10-CM | POA: Insufficient documentation

## 2021-01-01 DIAGNOSIS — M542 Cervicalgia: Secondary | ICD-10-CM | POA: Diagnosis not present

## 2021-01-01 DIAGNOSIS — G5601 Carpal tunnel syndrome, right upper limb: Secondary | ICD-10-CM | POA: Diagnosis not present

## 2021-01-01 HISTORY — DX: Carpal tunnel syndrome, right upper limb: G56.01

## 2021-01-01 HISTORY — DX: Carpal tunnel syndrome, left upper limb: G56.02

## 2021-01-01 MED ORDER — METHYLPREDNISOLONE 4 MG PO TABS: ORAL_TABLET | ORAL | 0 refills | Status: DC

## 2021-01-01 MED ORDER — BACLOFEN 10 MG PO TABS: 10.0000 mg | ORAL_TABLET | Freq: Three times a day (TID) | ORAL | 0 refills | Status: DC | PRN

## 2021-01-01 NOTE — Progress Notes (Signed)
Office Visit Note   Patient: Robert Krause           Date of Birth: 11/23/1934           MRN: [id number] Visit Date: 01/01/2021              Requested by: Colon Branch, [address] [place],  [place] [address] PCP: Colon Branch, MD   Assessment & Plan: Visit Diagnoses:  1. Neck pain   2. Carpal tunnel syndrome, left upper limb   3. Carpal tunnel syndrome, right upper limb     Plan: To calm down his acute pain, I would like to put him on a 6-day steroid taper and he agrees with this.  We will also try some baclofen as a muscle relaxant.  We will see him back in 2 weeks to make sure he is doing better from a neck standpoint.  At that point he is interested in considering carpal tunnel surgery on release the left more symptomatic side first.  We will see what it looks like in 2 weeks.  All questions and concerns were answered and addressed.  Follow-Up Instructions: Return in about 2 weeks (around 01/15/2021).   Orders:  Orders Placed This Encounter  Procedures  . XR Cervical Spine 2 or 3 views   Meds ordered this encounter  Medications  . methylPREDNISolone (MEDROL) 4 MG tablet    Sig: Medrol dose pack. Take as instructed    Dispense:  21 tablet    Refill:  0  . baclofen (LIORESAL) 10 MG tablet    Sig: Take 1 tablet (10 mg total) by mouth 3 (three) times daily as needed for muscle spasms.    Dispense:  30 each    Refill:  0      Procedures: No procedures performed   Clinical Data: No additional findings.   Subjective: Chief Complaint  Patient presents with  . Neck - Pain  The patient is someone of actually seen before.  He is well diagnosed severe bilateral carpal tunnel syndrome.  He comes in with 4-week history of neck pain that radiates to the left side.  He still has numbness and tingling in his hands.  He was originally on to have carpal tunnel surgery but this is before the COVID-19 pandemic and he put things off.  He is 87 years old.  He is  not a diabetic.  He said the pain in his neck started about 3 to 4 weeks ago and then went away but about 3 days ago it returned and is a little worse in terms of the pain.  He denies any other acute changes in his medical status.  HPI  Review of Systems There is currently listed no headache, chest pain, shortness of breath, fever, chills, nausea, vomiting  Objective: Vital Signs: There were no vitals taken for this visit.  Physical Exam He is alert and oriented x3 and in no acute distress Ortho Exam Examination of his neck shows he has good range of motion of his neck with some pain in the paraspinal muscles of the left side.  He has strong biceps and triceps as well as his deltoids.  He has weak grip strength bilaterally with positive Phalen's and Tinel signs and numbness in the median nerve distribution bilaterally with the left worse than the right. Specialty Comments:  No specialty comments available.  Imaging: XR Cervical Spine 2 or 3 views  Result Date: 01/01/2021  2 views of the cervical spine show no acute findings.  There is mid cervical degenerative changes with large osteophytes anteriorly at the mid cervical spine.  There is no significant malalignment.    PMFS History: Patient Active Problem List   Diagnosis Date Noted  . Carpal tunnel syndrome, left upper limb 01/01/2021  . Carpal tunnel syndrome, right upper limb 01/01/2021  . Chronic nausea 11/12/2020  . Anxiety and depression 05/29/2019  . CTS (carpal tunnel syndrome)-- B , was rec surgery before 01/27/2017  . Abdominal pain, right upper quadrant 04/24/2016  . PCP NOTES >>>>>> 11/08/2015  . Annual physical exam >>>>>>>>>>>>>>>>>>> 07/18/2014  . Colon polyp 04/14/2011  . 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 ACTINIC KERATOSIS, CHEEK, LEFT 08/28/2009  . PROSTATITIS, ACUTE, CHRONIC 03/26/2009  . FINGER SUP FB W/O MAJ OPEN WOUND&W/O MENTION INF 02/11/2008  . Hyperlipidemia 09/28/2007  . Essential hypertension  09/28/2007  . OTHER CHRONIC SINUSITIS 09/28/2007  . ALLERGIC RHINITIS 09/28/2007  . CYSTITIS, CHRONIC INTERSTITIAL -- xanax prn, Dr Amalia Hailey  09/28/2007  . OSTEOARTHRITIS 09/28/2007   Past Medical History:  Diagnosis Date  . Adenomatous polyp of colon 10/2005  . Allergic rhinitis   . Anxiety   . BPH (benign prostatic hyperplasia)   . Chronic interstitial cystitis    Dr Amalia Hailey  . Chronic sinusitis   . CTS (carpal tunnel syndrome) 01/27/2017  . Glaucoma   . Hyperlipidemia   . Hypertension   . Osteoarthritis    no per pt  . [place]ous [place]    right inferior clavicle, shave    Family History  Problem Relation Age of Onset  . Anxiety disorder Other   . Hyperlipidemia Father   . Hypertension Father   . CAD Father   . Colon cancer Neg Hx   . Prostate cancer Neg Hx   . Esophageal cancer Neg Hx   . Rectal cancer Neg Hx   . Stomach cancer Neg Hx     Past Surgical History:  Procedure Laterality Date  . APPENDECTOMY    . CATARACT EXTRACTION Right 06-2015  . COLONOSCOPY    . POLYPECTOMY    . TRANSURETHRAL RESECTION OF PROSTATE    . UPPER GASTROINTESTINAL ENDOSCOPY     Social History   Occupational History  . Occupation: retired- Company secretary: RETIRED  Tobacco Use  . Smoking status: Never Smoker  . Smokeless tobacco: Never Used  Vaping Use  . Vaping Use: Never used  Substance and Sexual Activity  . Alcohol use: Yes    Alcohol/week: 3.0 standard drinks    Types: 3 Glasses of wine per week    Comment: wine  . Drug use: No  . Sexual activity: Not on file

## 2021-01-02 ENCOUNTER — Telehealth: Payer: Self-pay | Admitting: Pharmacist

## 2021-01-02 NOTE — Progress Notes (Addendum)
Chronic Care Management Pharmacy Assistant   Name: EVERHETT BOZARD  MRN: [id number] DOB: 12/15/1933  Reason for Encounter: Disease State for DM.  Patient Questions:  1.  Have you seen any other providers since your last visit? Yes.   2.  Any changes in your medicines or health? Yes.   PCP : Colon Branch, MD   Their chronic conditions include: Hypertension, Hyperlipidemia, BPH, GERD.  Office Visits: None since 12/14/20  Consults: 01/01/21 Orthopedics Neck pain. STARTED Baclofen 10 mg 3 times daily and Methylprednisolone 4 mg dose pack.  Allergies:  No Known Allergies  Medications: Outpatient Encounter Medications as of 01/02/2021  Medication Sig   ALPRAZolam (XANAX) 0.25 MG tablet SMARTSIG:1-2 Tablet(s) By Mouth Every Evening PRN   baclofen (LIORESAL) 10 MG tablet Take 1 tablet (10 mg total) by mouth 3 (three) times daily as needed for muscle spasms.   irbesartan-hydrochlorothiazide (AVALIDE) 150-12.5 MG tablet Take 1 tablet by mouth daily.   latanoprost (XALATAN) 0.005 % ophthalmic solution Place 1 drop into both eyes at bedtime.   methylPREDNISolone (MEDROL) 4 MG tablet Medrol dose pack. Take as instructed   ondansetron (ZOFRAN) 4 MG tablet TAKE 1 TABLET(4 MG) BY MOUTH EVERY 8 HOURS AS NEEDED FOR NAUSEA OR VOMITING (Patient not taking: Reported on 11/12/2020)   pantoprazole (PROTONIX) 40 MG tablet TAKE 1 TABLET(40 MG) BY MOUTH TWICE DAILY (Patient not taking: No sig reported)   pravastatin (PRAVACHOL) 10 MG tablet Take 1 tablet (10 mg total) by mouth daily.   predniSONE (DELTASONE) 10 MG tablet 4 tablets x 2 days, 3 tabs x 2 days, 2 tabs x 2 days, 1 tab x 2 days   promethazine (PHENERGAN) 12.5 MG tablet Take 1 tablet (12.5 mg total) by mouth every 8 (eight) hours as needed for nausea or vomiting. (Patient not taking: No sig reported)   Tamsulosin HCl (FLOMAX) 0.4 MG CAPS Take 1 capsule by mouth Daily.   No facility-administered encounter medications on file as of 01/02/2021.     Current Diagnosis: Patient Active Problem List   Diagnosis Date Noted   Carpal tunnel syndrome, left upper limb 01/01/2021   Carpal tunnel syndrome, right upper limb 01/01/2021   Chronic nausea 11/12/2020   Anxiety and depression 05/29/2019   CTS (carpal tunnel syndrome)-- B , was rec surgery before 01/27/2017   Abdominal pain, right upper quadrant 04/24/2016   PCP NOTES >>>>>> 11/08/2015   Annual physical exam >>>>>>>>>>>>>>>>>>> 07/18/2014   Colon polyp 04/14/2011   CONTRACTURE OF PALMAR FASCIA 10/16/2010   ACTINIC KERATOSIS, CHEEK, LEFT 08/28/2009   PROSTATITIS, ACUTE, CHRONIC 03/26/2009   FINGER SUP FB W/O MAJ OPEN WOUND&W/O MENTION INF 02/11/2008   Hyperlipidemia 09/28/2007   Essential hypertension 09/28/2007   OTHER CHRONIC SINUSITIS 09/28/2007   ALLERGIC RHINITIS 09/28/2007   CYSTITIS, CHRONIC INTERSTITIAL -- xanax prn, Dr Amalia Hailey  09/28/2007   OSTEOARTHRITIS 09/28/2007    Goals Addressed   None    Recent Relevant Labs: Lab Results  Component Value Date/Time   HGBA1C 5.5 05/15/2020 08:50 AM   HGBA1C 5.6 10/10/2019 01:26 PM    Kidney Function Lab Results  Component Value Date/Time   CREATININE 1.00 11/12/2020 09:28 AM   CREATININE 1.03 07/27/2020 10:51 AM   GFR 69.33 11/12/2020 09:28 AM   GFRNONAA 63.07 10/30/2009 09:28 AM   GFRAA 85 10/24/2008 12:00 AM    Current antihyperglycemic regimen: None.  Adherence Review: Is the patient currently on a STATIN medication? No.  Is the patient currently on ACE/ARB  medication? No.   Does the patient have >5 day gap between last estimated fill dates? No  Third unsuccessful telephone outreach was attempted today. The patient was referred to the pharmacist for assistance with care management and care coordination.   Follow-Up:  Pharmacist Review   Charlann Lange, RMA Clinical Pharmacist Assistant [phone]  3 minutes spent in review, coordination, and documentation.  Reviewed by: Beverly Milch,  PharmD Clinical Pharmacist [facility] [phone]

## 2021-01-07 DIAGNOSIS — H409 Unspecified glaucoma: Secondary | ICD-10-CM | POA: Insufficient documentation

## 2021-01-07 DIAGNOSIS — N400 Enlarged prostate without lower urinary tract symptoms: Secondary | ICD-10-CM | POA: Insufficient documentation

## 2021-01-07 DIAGNOSIS — M1990 Unspecified osteoarthritis, unspecified site: Secondary | ICD-10-CM | POA: Insufficient documentation

## 2021-01-07 DIAGNOSIS — I10 Essential (primary) hypertension: Secondary | ICD-10-CM | POA: Insufficient documentation

## 2021-01-07 DIAGNOSIS — F419 Anxiety disorder, unspecified: Secondary | ICD-10-CM | POA: Insufficient documentation

## 2021-01-07 DIAGNOSIS — R238 Other skin changes: Secondary | ICD-10-CM | POA: Insufficient documentation

## 2021-01-08 ENCOUNTER — Ambulatory Visit: Payer: Medicare Other | Admitting: Cardiology

## 2021-01-08 ENCOUNTER — Encounter: Payer: Self-pay | Admitting: Cardiology

## 2021-01-08 ENCOUNTER — Other Ambulatory Visit: Payer: Self-pay

## 2021-01-08 ENCOUNTER — Other Ambulatory Visit: Payer: Self-pay | Admitting: Orthopaedic Surgery

## 2021-01-08 VITALS — BP 136/72 | HR 59 | Ht 70.0 in | Wt 190.1 lb

## 2021-01-08 DIAGNOSIS — I10 Essential (primary) hypertension: Secondary | ICD-10-CM

## 2021-01-08 DIAGNOSIS — E782 Mixed hyperlipidemia: Secondary | ICD-10-CM | POA: Diagnosis not present

## 2021-01-08 NOTE — Patient Instructions (Signed)

## 2021-01-08 NOTE — Progress Notes (Signed)
Cardiology Office Note:    Date:  01/08/2021   ID:  [name], DOB June 08, 1934, MRN [id number]  PCP:  Colon Branch, MD  Cardiologist:  Jenean Lindau, MD   Referring MD: Colon Branch, MD    ASSESSMENT:    1. Essential hypertension   2. Mixed hyperlipidemia    PLAN:    In order of problems listed above:  1. Primary prevention stressed with the patient.  Importance of compliance with diet medication stressed any vocalized understanding.  He walks on a regular basis. 2. Essential hypertension: Blood pressure stable and diet was emphasized.  Salt intake issues were discussed so was lifestyle modification. 3. Palpitations: These have resolved.  He had mildly abnormal event monitoring with PACs and PVCs.  He is happy about it. 4. Mixed dyslipidemia: Diet was emphasized.  He plans to his healthy habits with diet.  He is a very active man and is very meticulous about his health. 5. Patient will be seen in follow-up appointment in 6 months or earlier if the patient has any concerns    Medication Adjustments/Labs and Tests Ordered: Current medicines are reviewed at length with the patient today.  Concerns regarding medicines are outlined above.  Orders Placed This Encounter  Procedures  . EKG 12-Lead   No orders of the defined types were placed in this encounter.    No chief complaint on file.    History of Present Illness:    Robert Krause is a 87 y.o. male.  Patient has past medical history of essential hypertension.  He denies any problems at this time and takes care of activities of daily living.  No chest pain orthopnea or PND.  At the time of my evaluation, the patient is alert awake oriented and in no distress.  He walks on a regular basis.  Past Medical History:  Diagnosis Date  . Abdominal pain, right upper quadrant 04/24/2016  . ACTINIC KERATOSIS, CHEEK, LEFT 08/28/2009   Qualifier: Diagnosis of  By: Arnoldo Morale MD, Balinda Quails   . Adenomatous polyp of colon 10/2005   . Allergic rhinitis   . ALLERGIC RHINITIS 09/28/2007   Qualifier: Diagnosis of  By: Arnoldo Morale MD, Balinda Quails   . Annual physical exam >>>>>>>>>>>>>>>>>>> 07/18/2014  . Anxiety   . Anxiety and depression 05/29/2019  . BPH (benign prostatic hyperplasia)   . Carpal tunnel syndrome, left upper limb 01/01/2021  . Carpal tunnel syndrome, right upper limb 01/01/2021  . Chronic interstitial cystitis    Dr Amalia Hailey  . Chronic nausea 11/12/2020  . Chronic sinusitis   . Colon polyp 04/14/2011  . Contracture of palmar fascia 10/16/2010   Qualifier: Diagnosis of  By: Arnoldo Morale MD, Balinda Quails   . CTS (carpal tunnel syndrome) 01/27/2017  . Essential hypertension 09/28/2007   Qualifier: Diagnosis of  By: Arnoldo Morale MD, Balinda Quails   . FINGER SUP FB W/O MAJ OPEN WOUND&W/O MENTION INF 02/11/2008   Qualifier: Diagnosis of  By: Arnoldo Morale MD, Balinda Quails   . Glaucoma   . Hyperlipidemia   . Hypertension   . Osteoarthritis    no per pt  . OSTEOARTHRITIS 09/28/2007   Qualifier: Diagnosis of  By: Arnoldo Morale MD, Balinda Quails   . Other chronic sinusitis 09/28/2007   Qualifier: Diagnosis of  By: Arnoldo Morale MD, Balinda Quails PCP NOTES >>>>>> 11/08/2015  . PROSTATITIS, ACUTE, CHRONIC 03/26/2009   Qualifier: Diagnosis of  By: Arnoldo Morale MD, Balinda Quails   . [place]ous [place]  right inferior clavicle, shave    Past Surgical History:  Procedure Laterality Date  . APPENDECTOMY    . CATARACT EXTRACTION Right 06-2015  . COLONOSCOPY    . POLYPECTOMY    . TRANSURETHRAL RESECTION OF PROSTATE    . UPPER GASTROINTESTINAL ENDOSCOPY      Current Medications: Current Meds  Medication Sig  . ALPRAZolam (XANAX) 0.25 MG tablet Take 0.25 mg by mouth as needed for anxiety.  . baclofen (LIORESAL) 10 MG tablet TAKE 1 TABLET(10 MG) BY MOUTH THREE TIMES DAILY AS NEEDED FOR MUSCLE SPASMS  . irbesartan-hydrochlorothiazide (AVALIDE) 150-12.5 MG tablet Take 1 tablet by mouth daily.  Marland Kitchen latanoprost (XALATAN) 0.005 % ophthalmic solution Place 1 drop into both eyes at bedtime.  . pravastatin  (PRAVACHOL) 10 MG tablet Take 1 tablet (10 mg total) by mouth daily.  . Tamsulosin HCl (FLOMAX) 0.4 MG CAPS Take 1 capsule by mouth Daily.     Allergies:   Patient has no known allergies.   Social History   Socioeconomic History  . Marital status: Married    Spouse name: Not on file  . Number of children: 1  . Years of education: Not on file  . Highest education level: Not on file  Occupational History  . Occupation: retired- Company secretary: RETIRED  Tobacco Use  . Smoking status: Never Smoker  . Smokeless tobacco: Never Used  Vaping Use  . Vaping Use: Never used  Substance and Sexual Activity  . Alcohol use: Yes    Alcohol/week: 3.0 standard drinks    Types: 3 Glasses of wine per week    Comment: wine  . Drug use: No  . Sexual activity: Not on file  Other Topics Concern  . Not on file  Social History Narrative   Born in [place], raised in [place], moved to [place] in the 60s   Household: pt and wife   Son lives in the [place] [facility] Strain: [place]   . Difficulty of Paying Living Expenses: Not very hard  Food Insecurity: Not on file  Transportation Needs: Not on file  Physical Activity: Not on file  Stress: Not on file  Social Connections: Not on file     Family History: The patient's family history includes Anxiety disorder in an other family member; CAD in his father; Hyperlipidemia in his father; Hypertension in his father. There is no history of Colon cancer, Prostate cancer, Esophageal cancer, Rectal cancer, or Stomach cancer.  ROS:   Please see the history of present illness.    All other systems reviewed and are negative.  EKGs/Labs/Other Studies Reviewed:    The following studies were reviewed today: EVENT MONITOR REPORT:   Patient was monitored from 07/22/2019 to 07/25/2019. Indication:                    Palpitations Ordering physician:  Jenean Lindau, MD  Referring physician:        Jenean Lindau,  MD    Baseline rhythm: Sinus  Minimum heart rate: 39 BPM.  Average heart rate: 60 BPM.  Maximal heart rate 105 BPM.  Atrial arrhythmia: None significant, rare PACs  Ventricular arrhythmia: Rare PVCs, occasional ventricular bigeminy and trigeminy  Conduction abnormality: None significant  Symptoms: None significant   Conclusion:  Mildly abnormal but largely unremarkable event monitor.  Interpreting  cardiologist: Jenean Lindau, MD  Date: 08/03/2019 4:09 PM  Recent Labs: 05/15/2020: Hemoglobin 15.5; Platelets 181 11/12/2020: ALT 14; BUN 13; Creatinine, Ser 1.00; Potassium 4.3; Sodium 137; TSH 2.24  Recent Lipid Panel    Component Value Date/Time   CHOL 142 11/12/2020 0928   TRIG 72.0 11/12/2020 0928   TRIG 55 10/05/2006 1439   HDL 47.90 11/12/2020 0928   CHOLHDL 3 11/12/2020 0928   VLDL 14.4 11/12/2020 0928   LDLCALC 80 11/12/2020 0928   LDLDIRECT 153.4 06/06/2013 1156    Physical Exam:    VS:  BP 136/72   Pulse (!) 59   Ht 5\' 10"  (1.778 m)   Wt 190 lb 1.3 oz (86.2 kg)   SpO2 99%   BMI 27.27 kg/m     Wt Readings from Last 3 Encounters:  01/08/21 190 lb 1.3 oz (86.2 kg)  11/12/20 193 lb 8 oz (87.8 kg)  07/09/20 199 lb (90.3 kg)     GEN: Patient is in no acute distress HEENT: Normal NECK: No JVD; No carotid bruits LYMPHATICS: No lymphadenopathy CARDIAC: Hear sounds regular, 2/6 systolic murmur at the apex. RESPIRATORY:  Clear to auscultation without rales, wheezing or rhonchi  ABDOMEN: Soft, non-tender, non-distended MUSCULOSKELETAL:  No edema; No deformity  SKIN: Warm and dry NEUROLOGIC:  Alert and oriented x 3 PSYCHIATRIC:  Normal affect   Signed, Jenean Lindau, MD  01/08/2021 4:09 PM    [facility]

## 2021-01-15 ENCOUNTER — Ambulatory Visit: Payer: Medicare Other | Admitting: Orthopaedic Surgery

## 2021-01-17 ENCOUNTER — Other Ambulatory Visit: Payer: Self-pay | Admitting: Internal Medicine

## 2021-01-21 ENCOUNTER — Telehealth: Payer: Self-pay | Admitting: Pharmacist

## 2021-01-21 NOTE — Progress Notes (Signed)
Chronic Care Management Pharmacy Assistant   Name: Robert Krause  MRN: [id number] DOB: 1933/12/31  Reason for Encounter: Adherence Review  PCP : Colon Branch, MD  Verified Adherence Gap Information. Per insurance data, the patient is 100% compliant with the CHOL (Pravastatin) medication. The patient is 90-99% compliant with HTN (Irbesar/HCTZ) medication. Per insurance data patient has not met their annual wellness or their wellness bundle screening. Their most recent A1C was 5.5 on 05/15/20.The patients most recent blood pressure was 106/56 on 07/09/20. The patient was able to met his blood pressure goal by keeping his blood pressure below 140/90.The patients total gaps-all measures is equal to 2.  Follow-Up:  Pharmacist Review   Charlann Lange, [place] Pharmacist Assistant [phone]

## 2021-02-01 ENCOUNTER — Other Ambulatory Visit: Payer: Self-pay

## 2021-02-01 ENCOUNTER — Encounter: Payer: Self-pay | Admitting: Internal Medicine

## 2021-02-01 ENCOUNTER — Ambulatory Visit (INDEPENDENT_AMBULATORY_CARE_PROVIDER_SITE_OTHER): Payer: Medicare Other | Admitting: Internal Medicine

## 2021-02-01 VITALS — BP 162/80 | HR 60 | Temp 97.4°F | Resp 16 | Ht 70.0 in | Wt 192.5 lb

## 2021-02-01 DIAGNOSIS — M542 Cervicalgia: Secondary | ICD-10-CM

## 2021-02-01 NOTE — Patient Instructions (Signed)
Continue using Tylenol for pain  Please call the orthopedic doctor, I would like you to see Dr.Nitka  or Dr. Ernestina Patches for your neck pain.

## 2021-02-01 NOTE — Progress Notes (Signed)
Subjective:    Patient ID: Robert Krause, male    DOB: [birth year], 87 y.o.   MRN: [id number]  DOS:  02/01/2021 Type of visit - description: Acute Ongoing left-sided neck pain. In the last 2 weeks it has been quite steady and intense. Denies upper or lower extremity paresthesias, the pain is located actually at the base of the left neck and radiates somewhat upward.  Wt Readings from Last 3 Encounters:  02/01/21 192 lb 8 oz (87.3 kg)  01/08/21 190 lb 1.3 oz (86.2 kg)  11/12/20 193 lb 8 oz (87.8 kg)     Review of Systems See above   Past Medical History:  Diagnosis Date  . Abdominal pain, right upper quadrant 04/24/2016  . ACTINIC KERATOSIS, CHEEK, LEFT 08/28/2009   Qualifier: Diagnosis of  By: Arnoldo Morale MD, Balinda Quails   . Adenomatous polyp of colon 10/2005  . Allergic rhinitis   . ALLERGIC RHINITIS 09/28/2007   Qualifier: Diagnosis of  By: Arnoldo Morale MD, Balinda Quails   . Annual physical exam >>>>>>>>>>>>>>>>>>> 07/18/2014  . Anxiety   . Anxiety and depression 05/29/2019  . BPH (benign prostatic hyperplasia)   . Carpal tunnel syndrome, left upper limb 01/01/2021  . Carpal tunnel syndrome, right upper limb 01/01/2021  . Chronic interstitial cystitis    Dr Amalia Hailey  . Chronic nausea 11/12/2020  . Chronic sinusitis   . Colon polyp 04/14/2011  . Contracture of palmar fascia 10/16/2010   Qualifier: Diagnosis of  By: Arnoldo Morale MD, Balinda Quails   . CTS (carpal tunnel syndrome) 01/27/2017  . Essential hypertension 09/28/2007   Qualifier: Diagnosis of  By: Arnoldo Morale MD, Balinda Quails   . FINGER SUP FB W/O MAJ OPEN WOUND&W/O MENTION INF 02/11/2008   Qualifier: Diagnosis of  By: Arnoldo Morale MD, Balinda Quails   . Glaucoma   . Hyperlipidemia   . Hypertension   . Osteoarthritis    no per pt  . OSTEOARTHRITIS 09/28/2007   Qualifier: Diagnosis of  By: Arnoldo Morale MD, Balinda Quails   . Other chronic sinusitis 09/28/2007   Qualifier: Diagnosis of  By: Arnoldo Morale MD, Balinda Quails PCP NOTES >>>>>> 11/08/2015  . PROSTATITIS, ACUTE, CHRONIC 03/26/2009    Qualifier: Diagnosis of  By: Arnoldo Morale MD, Balinda Quails [place]ous [place]    right inferior clavicle, shave    Past Surgical History:  Procedure Laterality Date  . APPENDECTOMY    . CATARACT EXTRACTION Right 06-2015  . COLONOSCOPY    . POLYPECTOMY    . TRANSURETHRAL RESECTION OF PROSTATE    . UPPER GASTROINTESTINAL ENDOSCOPY      Allergies as of 02/01/2021   No Known Allergies     Medication List       Accurate as of February 01, 2021 11:59 PM. If you have any questions, ask your nurse or doctor.        ALPRAZolam 0.25 MG tablet Commonly known as: XANAX Take 0.25 mg by mouth as needed for anxiety.   baclofen 10 MG tablet Commonly known as: LIORESAL TAKE 1 TABLET(10 MG) BY MOUTH THREE TIMES DAILY AS NEEDED FOR MUSCLE SPASMS   irbesartan-hydrochlorothiazide 150-12.5 MG tablet Commonly known as: AVALIDE Take 1 tablet by mouth daily.   latanoprost 0.005 % ophthalmic solution Commonly known as: XALATAN Place 1 drop into both eyes at bedtime.   pravastatin 10 MG tablet Commonly known as: PRAVACHOL Take 1 tablet (10 mg total) by mouth daily.   tamsulosin 0.4 MG Caps capsule Commonly known as: FLOMAX  Take 1 capsule by mouth Daily.          Objective:   Physical Exam Neck:     BP (!) 162/80 (BP Location: Right Arm, Patient Position: Sitting, Cuff Size: Small)   Pulse 60   Temp (!) 97.4 F (36.3 C) (Oral)   Resp 16   Ht 5\' 10"  (1.778 m)   Wt 192 lb 8 oz (87.3 kg)   SpO2 97%   BMI 27.62 kg/m  General:   Well developed, NAD, BMI noted. HEENT:  Normocephalic . Face symmetric, atraumatic Neck: No TTP of the cervical spine. Range of motion: Normal, when he turns to the left or right he has some pulling at the left base of the neck. Shoulders: Range of motion normal without pain. Lower extremities: no pretibial edema bilaterally  Skin: Not pale. Not jaundice Neurologic:  alert & oriented X3.  Speech normal, gait appropriate for age and unassisted.  Motor, DTR  symmetric Psych--  Cognition and judgment appear intact.  Cooperative with normal attention span and concentration.  Behavior appropriate. No anxious or depressed appearing.      Assessment     Assessment Prediabetes HTN CV: --Bradycardia: Holter 2016 essentially negative --06/07/2019 dx was  DOE and palpitations: ECHO unremarkable, nuclear stress test EF 45%, no ischemic changes Hyperlipidemia Anxiety:  -on xanax, stress related to interstitial cystitis - lexapro 05/2019 (s/e HA, constipation) Insomnia: On melatonin, not interested in other medications DJD CTS: B, offered surgery before  Obesity: BMI 32 Glaucoma GU: Dr. Amalia Hailey --BPH, TURP --Interstitial cystitis (on gabapentin prn) Venous lake, right inferior clavicle Chronic nausea: GB U/S 2017 (-),  saw GI, EGD 11-2018: Erosive gastritis, BX with no H. pylori, + reactive gastropathy.   CT abdomen 08-2019: Benign.   PLAN Neck pain: Symptoms a started approximately 05-2020 after he did some yard work, has pain on and off, s/p a round of prednisone back in December and again   01/11/2021 when she saw Dr. Ninfa Linden, orthopedics. The last prednisone d\round did not help, he was also prescribed baclofen: No help. Plan: Refer to Dr. Flavia Shipper or Dr. Ernestina Patches for further evaluation. Patient agreed    This visit occurred during the SARS-CoV-2 public health emergency.  Safety protocols were in place, including screening questions prior to the visit, additional usage of staff PPE, and extensive cleaning of exam room while observing appropriate contact time as indicated for disinfecting solutions.

## 2021-02-03 NOTE — Assessment & Plan Note (Signed)
Neck pain: Symptoms a started approximately 05-2020 after he did some yard work, has pain on and off, s/p a round of prednisone back in December and again   01/11/2021 when she saw Dr. Ninfa Linden, orthopedics. The last prednisone d\round did not help, he was also prescribed baclofen: No help. Plan: Refer to Dr. Flavia Shipper or Dr. Ernestina Patches for further evaluation. Patient agreed

## 2021-02-28 ENCOUNTER — Telehealth: Payer: Medicare Other

## 2021-02-28 ENCOUNTER — Telehealth: Payer: Self-pay | Admitting: Pharmacist

## 2021-02-28 NOTE — Telephone Encounter (Signed)
Call patient at 10:30 and 12:30 - spoke with patient wife who reported patient was not at home (she had expected him around 12:30) rescheduled appt to tomorrow.

## 2021-03-01 ENCOUNTER — Telehealth: Payer: Medicare Other

## 2021-03-05 ENCOUNTER — Telehealth: Payer: Medicare Other

## 2021-03-07 ENCOUNTER — Ambulatory Visit: Payer: Medicare Other | Admitting: Specialist

## 2021-03-11 NOTE — Progress Notes (Signed)
This encounter was created in error - please disregard.

## 2021-03-13 ENCOUNTER — Encounter: Payer: Self-pay | Admitting: Internal Medicine

## 2021-03-13 ENCOUNTER — Ambulatory Visit: Payer: Medicare Other | Admitting: Internal Medicine

## 2021-03-13 ENCOUNTER — Ambulatory Visit: Payer: Medicare Other

## 2021-03-18 ENCOUNTER — Telehealth: Payer: Self-pay | Admitting: Internal Medicine

## 2021-03-18 NOTE — Telephone Encounter (Signed)
Pt called very upset because he received a letter stating he had two no show pt stated he call the day before the appointment  to cancel because he was not  feeling well and he also said he has express to pharmacist that he was not interested in visiting with her  Please advice

## 2021-03-20 ENCOUNTER — Telehealth: Payer: Self-pay

## 2021-03-20 NOTE — Telephone Encounter (Signed)
I left a voice mail for the patient letting him know I did receive the message from his call on Monday and I apologize for the miscommunication and the no shows that were marked in his chart.  I advised I would change his no show for the appt with Dr. Larose Kells on the 20th to a cancelled appt per the pt and notify the pharmacist the pt does not wish to have visits with her and if he has any further questions or concerns to please call me.

## 2021-03-20 NOTE — Telephone Encounter (Signed)
Pt called on 03/18/21 upset because he had stated he did not wish to do a pharmacy visit and then got a no show for the DOS on 03/13/21 along with a visit with Dr. Larose Kells he stated he had cancelled on the 20th because he was not feeling well. I tried to call the pt and notify him we would change both visits to "canceled" in his chart, but he did not answer, so I left a very detailed message on his voice mail.

## 2021-03-21 NOTE — Telephone Encounter (Signed)
That I am not sure off.  You may want to reach out to his spouse for clarification.

## 2021-03-21 NOTE — Telephone Encounter (Signed)
I had tried to reach patient a few times and only was able to speak to his wife.  Did patient indicate he would like to disenroll from the CCM program? I can have [facility] change his enrollment status if he no longer wants to participate.   Noelle Sease

## 2021-04-04 ENCOUNTER — Ambulatory Visit: Payer: Medicare Other | Admitting: Specialist

## 2021-04-19 ENCOUNTER — Other Ambulatory Visit: Payer: Self-pay | Admitting: Internal Medicine

## 2021-04-26 DIAGNOSIS — H6123 Impacted cerumen, bilateral: Secondary | ICD-10-CM | POA: Diagnosis not present

## 2021-05-06 DIAGNOSIS — H903 Sensorineural hearing loss, bilateral: Secondary | ICD-10-CM | POA: Diagnosis not present

## 2021-05-29 ENCOUNTER — Ambulatory Visit (INDEPENDENT_AMBULATORY_CARE_PROVIDER_SITE_OTHER): Payer: Medicare Other | Admitting: Otolaryngology

## 2021-05-29 ENCOUNTER — Ambulatory Visit: Payer: Medicare Other | Admitting: Specialist

## 2021-05-29 ENCOUNTER — Encounter: Payer: Self-pay | Admitting: Specialist

## 2021-05-29 ENCOUNTER — Ambulatory Visit: Payer: Self-pay

## 2021-05-29 VITALS — BP 124/76 | HR 58 | Ht 71.0 in | Wt 192.0 lb

## 2021-05-29 DIAGNOSIS — G5601 Carpal tunnel syndrome, right upper limb: Secondary | ICD-10-CM | POA: Diagnosis not present

## 2021-05-29 DIAGNOSIS — M47812 Spondylosis without myelopathy or radiculopathy, cervical region: Secondary | ICD-10-CM

## 2021-05-29 DIAGNOSIS — M542 Cervicalgia: Secondary | ICD-10-CM

## 2021-05-29 DIAGNOSIS — G5602 Carpal tunnel syndrome, left upper limb: Secondary | ICD-10-CM

## 2021-05-29 MED ORDER — GABAPENTIN 100 MG PO CAPS: 100.0000 mg | ORAL_CAPSULE | Freq: Every day | ORAL | 2 refills | Status: DC

## 2021-05-29 MED ORDER — METHYLPREDNISOLONE 4 MG PO TABS: ORAL_TABLET | ORAL | 0 refills | Status: AC

## 2021-05-29 NOTE — Progress Notes (Signed)
Office Visit Note   Patient: Robert Krause           Date of Birth: 11/13/1934           MRN: [id number] Visit Date: 05/29/2021              Requested by: Colon Branch, [address] [place],  [place] [id number] PCP: Colon Branch, MD   Assessment & Plan: Visit Diagnoses:  1. Neck pain     Plan: Carpal Tunnel Syndrome  Carpal tunnel syndrome is a condition that causes pain in your hand and arm. The carpal tunnel is a narrow area located on the palm side of your wrist. Repeated wrist motion or certain diseases may cause swelling within the tunnel. This swelling pinches the main nerve in the wrist (median nerve). What are the causes? This condition may be caused by: Repeated wrist motions. Wrist injuries. Arthritis. A cyst or tumor in the carpal tunnel. Fluid buildup during pregnancy. Sometimes the cause of this condition is not known. What increases the risk? This condition is more likely to develop in: People who have jobs that cause them to repeatedly move their wrists in the same motion, such as Art gallery manager. Women. People with certain conditions, such as: Diabetes. Obesity. An underactive thyroid (hypothyroidism). Kidney failure. What are the signs or symptoms? Symptoms of this condition include: A tingling feeling in your fingers, especially in your thumb, index, and middle fingers. Tingling or numbness in your hand. An aching feeling in your entire arm, especially when your wrist and elbow are bent for long periods of time. Wrist pain that goes up your arm to your shoulder. Pain that goes down into your palm or fingers. A weak feeling in your hands. You may have trouble grabbing and holding items. Your symptoms may feel worse during the night. How is this diagnosed? This condition is diagnosed with a medical history and physical exam. You may also have tests, including: An electromyogram (EMG). This test measures electrical signals sent  by your nerves into the muscles. X-rays. How is this treated? Treatment for this condition includes: Lifestyle changes. It is important to stop doing or modify the activity that caused your condition. Physical or occupational therapy. Medicines for pain and inflammation. This may include medicine that is injected into your wrist. A wrist splint. Surgery. Follow these instructions at home: If you have a splint:  Wear it as told by your health care provider. Remove it only as told by your health care provider. Loosen the splint if your fingers become numb and tingle, or if they turn cold and blue. Keep the splint clean and dry. General instructions  Take over-the-counter and prescription medicines only as told by your health care provider. Rest your wrist from any activity that may be causing your pain. If your condition is work related, talk to your employer about changes that can be made, such as getting a wrist pad to use while typing. If directed, apply ice to the painful area: Put ice in a plastic bag. Place a towel between your skin and the bag. Leave the ice on for 20 minutes, 2-3 times per day. Keep all follow-up visits as told by your health care provider. This is important. Do any exercises as told by your health care provider, physical therapist, or occupational therapist. Contact a health care provider if: You have new symptoms. Your pain is not controlled with medicines. Your symptoms get worse.  This information is not intended to replace advice given to you by your health care provider. Make sure you discuss any questions you have with your health care provider. Document Released: 11/07/2000 Document Revised: 03/20/2016 Document Reviewed: 07/22/2017 Elsevier Interactive Patient Education  2017 [employer].   Avoid overhead lifting and overhead use of the arms. Do not lift greater than 5-10 lbs. Adjust head rest in vehicle to prevent hyperextension if rear ended. Take  extra precautions to avoid falling.  Follow-Up Instructions: Return in about 3 weeks (around 06/19/2021).   Orders:  Orders Placed This Encounter  Procedures   XR Cervical Spine 2 or 3 views   No orders of the defined types were placed in this encounter.     Procedures: No procedures performed   Clinical Data: No additional findings.   Subjective: Chief Complaint  Patient presents with   Neck - Pain    Was having trouble with neck on both sides, laying in bed and putting head on the pillow made it worse. He states that right now it is not bothering him to bad.     HPI  Review of Systems   Objective: Vital Signs: BP 124/76 (BP Location: Left Arm, Patient Position: Sitting)   Pulse (!) 58   Ht 5\' 11"  (1.803 m)   Wt 192 lb (87.1 kg)   BMI 26.78 kg/m   Physical Exam  Ortho Exam  Specialty Comments:  No specialty comments available.  Imaging: No results found.   PMFS History: Patient Active Problem List   Diagnosis Date Noted   Anxiety    BPH (benign prostatic hyperplasia)    Glaucoma    Hypertension    Osteoarthritis    Venous [place]    Carpal tunnel syndrome, left upper limb 01/01/2021   Carpal tunnel syndrome, right upper limb 01/01/2021   Chronic nausea 11/12/2020   Anxiety and depression 05/29/2019   CTS (carpal tunnel syndrome)-- B , was rec surgery before 01/27/2017   Abdominal pain, right upper quadrant 04/24/2016   PCP NOTES >>>>>> 11/08/2015   Annual physical exam >>>>>>>>>>>>>>>>>>> 07/18/2014   Colon polyp 04/14/2011   CONTRACTURE OF PALMAR FASCIA 10/16/2010   ACTINIC KERATOSIS, CHEEK, LEFT 08/28/2009   PROSTATITIS, ACUTE, CHRONIC 03/26/2009   FINGER SUP FB W/O MAJ OPEN WOUND&W/O MENTION INF 02/11/2008   Hyperlipidemia 09/28/2007   Essential hypertension 09/28/2007   OTHER CHRONIC SINUSITIS 09/28/2007   ALLERGIC RHINITIS 09/28/2007   CYSTITIS, CHRONIC INTERSTITIAL -- xanax prn, Dr Amalia Hailey  09/28/2007   OSTEOARTHRITIS 09/28/2007    Adenomatous polyp of colon 10/2005   Past Medical History:  Diagnosis Date   Abdominal pain, right upper quadrant 04/24/2016   ACTINIC KERATOSIS, CHEEK, LEFT 08/28/2009   Qualifier: Diagnosis of  By: Arnoldo Morale MD, John E    Adenomatous polyp of colon 10/2005   Allergic rhinitis    ALLERGIC RHINITIS 09/28/2007   Qualifier: Diagnosis of  By: Arnoldo Morale MD, Balinda Quails    Annual physical exam >>>>>>>>>>>>>>>>>>> 07/18/2014   Anxiety    Anxiety and depression 05/29/2019   BPH (benign prostatic hyperplasia)    Carpal tunnel syndrome, left upper limb 01/01/2021   Carpal tunnel syndrome, right upper limb 01/01/2021   Chronic interstitial cystitis    Dr Amalia Hailey   Chronic nausea 11/12/2020   Chronic sinusitis    Colon polyp 04/14/2011   Contracture of palmar fascia 10/16/2010   Qualifier: Diagnosis of  By: Arnoldo Morale MD, Balinda Quails    CTS (carpal tunnel syndrome) 01/27/2017   Essential  hypertension 09/28/2007   Qualifier: Diagnosis of  By: Arnoldo Morale MD, Balinda Quails    FINGER SUP FB W/O MAJ OPEN WOUND&W/O MENTION INF 02/11/2008   Qualifier: Diagnosis of  By: Arnoldo Morale MD, Balinda Quails    Glaucoma    Hyperlipidemia    Hypertension    Osteoarthritis    no per pt   OSTEOARTHRITIS 09/28/2007   Qualifier: Diagnosis of  By: Arnoldo Morale MD, Balinda Quails    Other chronic sinusitis 09/28/2007   Qualifier: Diagnosis of  By: Arnoldo Morale MD, Balinda Quails    PCP NOTES >>>>>> 11/08/2015   PROSTATITIS, ACUTE, CHRONIC 03/26/2009   Qualifier: Diagnosis of  By: Arnoldo Morale MD, John E    Venous [place]    right inferior clavicle, shave    Family History  Problem Relation Age of Onset   Anxiety disorder Other    Hyperlipidemia Father    Hypertension Father    CAD Father    Colon cancer Neg Hx    Prostate cancer Neg Hx    Esophageal cancer Neg Hx    Rectal cancer Neg Hx    Stomach cancer Neg Hx     Past Surgical History:  Procedure Laterality Date   APPENDECTOMY     CATARACT EXTRACTION Right 06-2015   COLONOSCOPY     POLYPECTOMY     TRANSURETHRAL RESECTION OF  PROSTATE     UPPER GASTROINTESTINAL ENDOSCOPY     Social History   Occupational History   Occupation: retired- Company secretary: RETIRED  Tobacco Use   Smoking status: Never   Smokeless tobacco: Never  Vaping Use   Vaping Use: Never used  Substance and Sexual Activity   Alcohol use: Yes    Alcohol/week: 3.0 standard drinks    Types: 3 Glasses of wine per week    Comment: wine   Drug use: No   Sexual activity: Not on file

## 2021-05-29 NOTE — Patient Instructions (Signed)
Carpal Tunnel Syndrome  Carpal tunnel syndrome is a condition that causes pain in your hand and arm. The carpal tunnel is a narrow area located on the palm side of your wrist. Repeated wrist motion or certain diseases may cause swelling within the tunnel. This swelling pinches the main nerve in the wrist (median nerve). What are the causes? This condition may be caused by: Repeated wrist motions. Wrist injuries. Arthritis. A cyst or tumor in the carpal tunnel. Fluid buildup during pregnancy. Sometimes the cause of this condition is not known. What increases the risk? This condition is more likely to develop in: People who have jobs that cause them to repeatedly move their wrists in the same motion, such as Art gallery manager. Women. People with certain conditions, such as: Diabetes. Obesity. An underactive thyroid (hypothyroidism). Kidney failure. What are the signs or symptoms? Symptoms of this condition include: A tingling feeling in your fingers, especially in your thumb, index, and middle fingers. Tingling or numbness in your hand. An aching feeling in your entire arm, especially when your wrist and elbow are bent for long periods of time. Wrist pain that goes up your arm to your shoulder. Pain that goes down into your palm or fingers. A weak feeling in your hands. You may have trouble grabbing and holding items. Your symptoms may feel worse during the night. How is this diagnosed? This condition is diagnosed with a medical history and physical exam. You may also have tests, including: An electromyogram (EMG). This test measures electrical signals sent by your nerves into the muscles. X-rays. How is this treated? Treatment for this condition includes: Lifestyle changes. It is important to stop doing or modify the activity that caused your condition. Physical or occupational therapy. Medicines for pain and inflammation. This may include medicine that is injected into your  wrist. A wrist splint. Surgery. Follow these instructions at home: If you have a splint:  Wear it as told by your health care provider. Remove it only as told by your health care provider. Loosen the splint if your fingers become numb and tingle, or if they turn cold and blue. Keep the splint clean and dry. General instructions  Take over-the-counter and prescription medicines only as told by your health care provider. Rest your wrist from any activity that may be causing your pain. If your condition is work related, talk to your employer about changes that can be made, such as getting a wrist pad to use while typing. If directed, apply ice to the painful area: Put ice in a plastic bag. Place a towel between your skin and the bag. Leave the ice on for 20 minutes, 2-3 times per day. Keep all follow-up visits as told by your health care provider. This is important. Do any exercises as told by your health care provider, physical therapist, or occupational therapist. Contact a health care provider if: You have new symptoms. Your pain is not controlled with medicines. Your symptoms get worse. This information is not intended to replace advice given to you by your health care provider. Make sure you discuss any questions you have with your health care provider. Document Released: 11/07/2000 Document Revised: 03/20/2016 Document Reviewed: 07/22/2017 Elsevier Interactive Patient Education  2017 [employer].   Avoid overhead lifting and overhead use of the arms. Do not lift greater than 5-10 lbs. Adjust head rest in vehicle to prevent hyperextension if rear ended. Take extra precautions to avoid falling.

## 2021-07-10 ENCOUNTER — Ambulatory Visit: Payer: Medicare Other | Admitting: Cardiology

## 2021-07-30 ENCOUNTER — Encounter: Payer: Self-pay | Admitting: Internal Medicine

## 2021-07-30 ENCOUNTER — Ambulatory Visit (INDEPENDENT_AMBULATORY_CARE_PROVIDER_SITE_OTHER): Payer: Medicare Other | Admitting: Internal Medicine

## 2021-07-30 ENCOUNTER — Other Ambulatory Visit: Payer: Self-pay

## 2021-07-30 VITALS — BP 136/80 | HR 59 | Temp 97.8°F | Resp 16 | Ht 70.0 in | Wt 187.0 lb

## 2021-07-30 DIAGNOSIS — R739 Hyperglycemia, unspecified: Secondary | ICD-10-CM

## 2021-07-30 DIAGNOSIS — M545 Low back pain, unspecified: Secondary | ICD-10-CM | POA: Diagnosis not present

## 2021-07-30 DIAGNOSIS — E782 Mixed hyperlipidemia: Secondary | ICD-10-CM | POA: Diagnosis not present

## 2021-07-30 DIAGNOSIS — I10 Essential (primary) hypertension: Secondary | ICD-10-CM

## 2021-07-30 LAB — POC URINALSYSI DIPSTICK (AUTOMATED)
Bilirubin, UA: NEGATIVE
Blood, UA: NEGATIVE
Glucose, UA: NEGATIVE
Ketones, UA: NEGATIVE
Leukocytes, UA: NEGATIVE
Nitrite, UA: NEGATIVE
Protein, UA: NEGATIVE
Spec Grav, UA: 1.01 (ref 1.010–1.025)
Urobilinogen, UA: 0.2 E.U./dL
pH, UA: 6 (ref 5.0–8.0)

## 2021-07-30 LAB — CBC WITH DIFFERENTIAL/PLATELET
Basophils Absolute: 0.1 10*3/uL (ref 0.0–0.1)
Basophils Relative: 1 % (ref 0.0–3.0)
Eosinophils Absolute: 0.1 10*3/uL (ref 0.0–0.7)
Eosinophils Relative: 2.6 % (ref 0.0–5.0)
HCT: 45.5 % (ref 39.0–52.0)
Hemoglobin: 15.2 g/dL (ref 13.0–17.0)
Lymphocytes Relative: 28.7 % (ref 12.0–46.0)
Lymphs Abs: 1.6 10*3/uL (ref 0.7–4.0)
MCHC: 33.5 g/dL (ref 30.0–36.0)
MCV: 95.4 fl (ref 78.0–100.0)
Monocytes Absolute: 0.5 10*3/uL (ref 0.1–1.0)
Monocytes Relative: 8.6 % (ref 3.0–12.0)
Neutro Abs: 3.3 10*3/uL (ref 1.4–7.7)
Neutrophils Relative %: 59.1 % (ref 43.0–77.0)
Platelets: 167 10*3/uL (ref 150.0–400.0)
RBC: 4.77 Mil/uL (ref 4.22–5.81)
RDW: 13 % (ref 11.5–15.5)
WBC: 5.6 10*3/uL (ref 4.0–10.5)

## 2021-07-30 LAB — BASIC METABOLIC PANEL
BUN: 16 mg/dL (ref 6–23)
CO2: 32 mEq/L (ref 19–32)
Calcium: 9.1 mg/dL (ref 8.4–10.5)
Chloride: 100 mEq/L (ref 96–112)
Creatinine, Ser: 1.09 mg/dL (ref 0.40–1.50)
GFR: 62.21 mL/min (ref >60.00)
Glucose, Bld: 104 mg/dL — ABNORMAL HIGH (ref 70–99)
Potassium: 4.3 mEq/L (ref 3.5–5.1)
Sodium: 137 mEq/L (ref 135–145)

## 2021-07-30 LAB — HEMOGLOBIN A1C: Hgb A1c MFr Bld: 5.5 % (ref 4.6–6.5)

## 2021-07-30 MED ORDER — PRAVASTATIN SODIUM 10 MG PO TABS
10.0000 mg | ORAL_TABLET | Freq: Every day | ORAL | 1 refills | Status: DC
Start: 2021-07-30 — End: 2022-01-27

## 2021-07-30 NOTE — Assessment & Plan Note (Signed)
Acute lumbalgia: In the context of doing heavy lifting few days prior to onset of symptoms, no red flags, recommend a heating pad, Tylenol, avoid NSAIDs, take baclofen which she has at home, call if not gradually better. U dip was negative. Prediabetes: Check A1c HTN: BP today is very good, ambulatory BPs 120, 130.  Continue Avalide, check a BMP and CBC High cholesterol: Ran out of Pravachol, sending a refill Neck pain: See last visit, doing better CTS: Ongoing symptoms, to be reassessed by Dr. Flavia Shipper Preventive care: Discussed the need for flu shot, had 4 COVID vaccines RTC December for a physical exam

## 2021-07-30 NOTE — Progress Notes (Signed)
Subjective:    Patient ID: Robert Krause, male    DOB: June 19, 1934, 87 y.o.   MRN: [id number]  DOS:  07/30/2021 Type of visit - description: acute  Having pain at the low back bilateral for the last 4 to 5 days. Pain is mild, worse when he bends forward. No associated bladder or bowel incontinence, lower extremity paresthesias or radiation to the leg. No injury but he did some heavy lifting 10 days ago. Denies fever chills No nausea or vomiting, no abdominal pain  Since he is here, we also talk about high blood pressure, high cholesterol, prediabetes.    Wt Readings from Last 3 Encounters:  07/30/21 187 lb (84.8 kg)  05/29/21 192 lb (87.1 kg)  02/01/21 192 lb 8 oz (87.3 kg)     Review of Systems Has chronic LUTS due to interstitial cystitis, symptoms are slightly exacerbated but no gross hematuria  Past Medical History:  Diagnosis Date   Abdominal pain, right upper quadrant 04/24/2016   ACTINIC KERATOSIS, CHEEK, LEFT 08/28/2009   Qualifier: Diagnosis of  By: Arnoldo Morale MD, John E    Adenomatous polyp of colon 10/2005   Allergic rhinitis    ALLERGIC RHINITIS 09/28/2007   Qualifier: Diagnosis of  By: Arnoldo Morale MD, Balinda Quails    Annual physical exam >>>>>>>>>>>>>>>>>>> 07/18/2014   Anxiety    Anxiety and depression 05/29/2019   BPH (benign prostatic hyperplasia)    Carpal tunnel syndrome, left upper limb 01/01/2021   Carpal tunnel syndrome, right upper limb 01/01/2021   Chronic interstitial cystitis    Dr Amalia Hailey   Chronic nausea 11/12/2020   Chronic sinusitis    Colon polyp 04/14/2011   Contracture of palmar fascia 10/16/2010   Qualifier: Diagnosis of  By: Arnoldo Morale MD, Balinda Quails    CTS (carpal tunnel syndrome) 01/27/2017   Essential hypertension 09/28/2007   Qualifier: Diagnosis of  By: Arnoldo Morale MD, Balinda Quails    FINGER SUP FB W/O MAJ OPEN WOUND&W/O MENTION INF 02/11/2008   Qualifier: Diagnosis of  By: Arnoldo Morale MD, Balinda Quails    Glaucoma    Hyperlipidemia    Hypertension    Osteoarthritis    no  per pt   OSTEOARTHRITIS 09/28/2007   Qualifier: Diagnosis of  By: Arnoldo Morale MD, Balinda Quails    Other chronic sinusitis 09/28/2007   Qualifier: Diagnosis of  By: Arnoldo Morale MD, Balinda Quails    PCP NOTES >>>>>> 11/08/2015   PROSTATITIS, ACUTE, CHRONIC 03/26/2009   Qualifier: Diagnosis of  By: Arnoldo Morale MD, John E    Venous lake    right inferior clavicle, shave    Past Surgical History:  Procedure Laterality Date   APPENDECTOMY     CATARACT EXTRACTION Right 06-2015   COLONOSCOPY     POLYPECTOMY     TRANSURETHRAL RESECTION OF PROSTATE     UPPER GASTROINTESTINAL ENDOSCOPY      Allergies as of 07/30/2021   No Known Allergies      Medication List        Accurate as of July 30, 2021 11:46 AM. If you have any questions, ask your nurse or doctor.          ALPRAZolam 0.25 MG tablet Commonly known as: XANAX Take 0.25 mg by mouth as needed for anxiety.   baclofen 10 MG tablet Commonly known as: LIORESAL TAKE 1 TABLET(10 MG) BY MOUTH THREE TIMES DAILY AS NEEDED FOR MUSCLE SPASMS   gabapentin 100 MG capsule Commonly known as: NEURONTIN Take 1 capsule (100 mg total)  by mouth at bedtime.   irbesartan-hydrochlorothiazide 150-12.5 MG tablet Commonly known as: AVALIDE Take 1 tablet by mouth daily.   latanoprost 0.005 % ophthalmic solution Commonly known as: XALATAN Place 1 drop into both eyes at bedtime.   pravastatin 10 MG tablet Commonly known as: PRAVACHOL Take 1 tablet (10 mg total) by mouth daily.   tamsulosin 0.4 MG Caps capsule Commonly known as: FLOMAX Take 1 capsule by mouth Daily.           Objective:   Physical Exam BP 136/80 (BP Location: Left Arm, Patient Position: Sitting, Cuff Size: Small)   Pulse (!) 59   Temp 97.8 F (36.6 C) (Oral)   Resp 16   Ht '5\' 10"'$  (1.778 m)   Wt 187 lb (84.8 kg)   SpO2 96%   BMI 26.83 kg/m  General:   Well developed, NAD, BMI noted. HEENT:  Normocephalic . Face symmetric, atraumatic Lungs:  CTA B Normal respiratory effort, no  intercostal retractions, no accessory muscle use. Heart: RRR,  no murmur.  Lower extremities: no pretibial edema bilaterally  Skin: Not pale. Not jaundice Neurologic:  alert & oriented X3.  Speech normal, gait and posture not antalgic. DTRs: Symmetrically slightly decreased. Psych--  Cognition and judgment appear intact.  Cooperative with normal attention span and concentration.  Behavior appropriate. No anxious or depressed appearing.      Assessment    Assessment Prediabetes HTN CV: --Bradycardia: Holter 2016 essentially negative --06/07/2019 dx was  DOE and palpitations: ECHO unremarkable, nuclear stress test EF 45%, no ischemic changes Hyperlipidemia Anxiety: on xanax, stress related to interstitial cystitis, lexapro intol (-2020) Insomnia: On melatonin, not interested in other medications DJD CTS: B, offered surgery before  Obesity: BMI 32 Glaucoma GU: Dr. Amalia Hailey --BPH, TURP --Interstitial cystitis (on gabapentin - xanax prn) Venous lake, right inferior clavicle Chronic nausea: GB U/S 2017 (-),  saw GI, EGD 11-2018: Erosive gastritis, BX with no H. pylori, + reactive gastropathy.   CT abdomen 08-2019: Benign.   PLAN Acute lumbalgia: In the context of doing heavy lifting few days prior to onset of symptoms, no red flags, recommend a heating pad, Tylenol, avoid NSAIDs, take baclofen which she has at home, call if not gradually better. U dip was negative. Prediabetes: Check A1c HTN: BP today is very good, ambulatory BPs 120, 130.  Continue Avalide, check a BMP and CBC High cholesterol: Ran out of Pravachol, sending a refill Neck pain: See last visit, doing better CTS: Ongoing symptoms, to be reassessed by Dr. Flavia Shipper Preventive care: Discussed the need for flu shot, had 4 COVID vaccines RTC December for a physical exam  This visit occurred during the SARS-CoV-2 public health emergency.  Safety protocols were in place, including screening questions prior to the visit,  additional usage of staff PPE, and extensive cleaning of exam room while observing appropriate contact time as indicated for disinfecting solutions.

## 2021-07-30 NOTE — Patient Instructions (Addendum)
For back pain: Use a heating pad Tylenol 500 mg: 2 tablets every 8 hours as needed for pain. Okay to take the muscle relaxant baclofen at bedtime. Call if not gradually better  Continue checking your blood pressure BP GOAL is between 110/65 and  135/85. If it is consistently higher or lower, let me know    GO TO THE LAB : Get the blood work     [place], [place] back for a physical exam by December 2022

## 2021-08-06 ENCOUNTER — Ambulatory Visit (INDEPENDENT_AMBULATORY_CARE_PROVIDER_SITE_OTHER): Payer: Medicare Other | Admitting: Physical Medicine and Rehabilitation

## 2021-08-06 ENCOUNTER — Encounter: Payer: Self-pay | Admitting: Physical Medicine and Rehabilitation

## 2021-08-06 ENCOUNTER — Other Ambulatory Visit: Payer: Self-pay

## 2021-08-06 DIAGNOSIS — G5602 Carpal tunnel syndrome, left upper limb: Secondary | ICD-10-CM

## 2021-08-06 DIAGNOSIS — R202 Paresthesia of skin: Secondary | ICD-10-CM | POA: Diagnosis not present

## 2021-08-06 DIAGNOSIS — G5601 Carpal tunnel syndrome, right upper limb: Secondary | ICD-10-CM

## 2021-08-06 NOTE — Progress Notes (Signed)
Cramping in both hands at night. Numbness in hands. Left worse than right Left hand dominant

## 2021-08-06 NOTE — Progress Notes (Signed)
Robert Krause - 87 y.o. male MRN [id number]  Date of birth: [birth year]  Office Visit Note: Visit Date: 08/06/2021 PCP: Colon Branch, MD Referred by: Colon Branch, MD  Subjective: Chief Complaint  Patient presents with   Left Hand - Numbness, Pain   Right Hand - Numbness, Pain   HPI:  Robert Krause is a 87 y.o. male who comes in today at the request of Dr. Basil Dess for electrodiagnostic study of the Bilateral upper extremities.  Patient is Right hand dominant.  Patient was previously seen by Dr. Jean Rosenthal and Benita Stabile, P.A.-C in 2019.  Prior electrodiagnostic study was done in our lab.  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  Plan at that time and follow-up from the nerve study was to complete decompression of the carpal tunnel.  It appears looking at the notes that there was never a follow-up after that point and they decided to schedule the surgery in the near future.  ROS Otherwise per HPI.  Assessment & Plan: Visit Diagnoses:    ICD-10-CM   1. Paresthesia of skin  R20.2 NCV with EMG (electromyography)    2. Carpal tunnel syndrome, left upper limb  G56.02 NCV with EMG (electromyography)    3. Carpal tunnel syndrome, right upper limb  G56.01 NCV with EMG (electromyography)      Plan: Impression: The above electrodiagnostic study is ABNORMAL and reveals evidence of:   A moderate/severe left median nerve entrapment at the wrist (carpal tunnel syndrome) affecting sensory and motor components.  Without decreased CMAP amplitude and without needle EMG findings this portends good outcome with appropriate decompression.  A severe right median nerve entrapment at the wrist (carpal tunnel syndrome) affecting sensory and motor components. Without needle EMG findings this portends good outcome with appropriate decompression although patient may have some residual  numbness.  There is no significant electrodiagnostic evidence of any other focal nerve entrapment, brachial plexopathy or cervical radiculopathy.   Recommendations: 1.  Follow-up with referring physician. 2.  Suggest surgical evaluation.  Meds & Orders: No orders of the defined types were placed in this encounter.   Orders Placed This Encounter  Procedures   NCV with EMG (electromyography)    Follow-up: Return in about 2 weeks (around 08/20/2021) for Basil Dess, MD.   Procedures: No procedures performed  EMG & NCV Findings: Evaluation of the left median motor nerve showed prolonged distal onset latency (8.0 ms) and decreased conduction velocity (Elbow-Wrist, 46 m/s).  The right median motor nerve showed prolonged distal onset latency (9.2 ms), reduced amplitude (2.9 mV), and decreased conduction velocity (Elbow-Wrist, 43 m/s).  The left median (across palm) sensory nerve showed no response (Palm), prolonged distal peak latency (9.4 ms), and reduced amplitude (5.2 V).  The right median (across palm) sensory nerve showed no response (Wrist) and prolonged distal peak latency (Palm, 5.1 ms).  All remaining nerves (as indicated in the following tables) were within normal limits.  Left vs. Right side comparison data for the median motor nerve indicates abnormal L-R latency difference (1.2 ms).    All examined muscles (as indicated in the following table) showed no evidence of electrical instability.    Impression: The above electrodiagnostic study is ABNORMAL and reveals evidence of:   A moderate/severe left median nerve entrapment at the wrist (carpal tunnel syndrome) affecting sensory and motor components.  Without decreased CMAP amplitude and without needle  EMG findings this portends good outcome with appropriate decompression.  A severe right median nerve entrapment at the wrist (carpal tunnel syndrome) affecting sensory and motor components. Without needle EMG findings this portends good  outcome with appropriate decompression although patient may have some residual numbness.  There is no significant electrodiagnostic evidence of any other focal nerve entrapment, brachial plexopathy or cervical radiculopathy.   Recommendations: 1.  Follow-up with referring physician. 2.  Suggest surgical evaluation.  ___________________________ Laurence Spates FAAPMR Board Certified, American Board of Physical Medicine and Rehabilitation    Nerve Conduction Studies Anti Sensory Summary Table   Stim Site NR Peak (ms) Norm Peak (ms) P-T Amp (V) Norm P-T Amp Site1 Site2 Delta-P (ms) Dist (cm) Vel (m/s) Norm Vel (m/s)  Left Median Acr Palm Anti Sensory (2nd Digit)  30.3C  Wrist    *9.4 <3.6 *5.2 >10 Wrist Palm  0.0    Palm *NR  <2.0          Right Median Acr Palm Anti Sensory (2nd Digit)  30.7C  Wrist *NR  <3.6  >10 Wrist Palm  0.0    Palm    *5.1 <2.0 12.1         Left Ulnar Anti Sensory (5th Digit)  30.8C  Wrist    3.7 <3.7 21.1 >15.0 Wrist 5th Digit 3.7 14.0 38 >38   Motor Summary Table   Stim Site NR Onset (ms) Norm Onset (ms) O-P Amp (mV) Norm O-P Amp Site1 Site2 Delta-0 (ms) Dist (cm) Vel (m/s) Norm Vel (m/s)  Left Median Motor (Abd Poll Brev)  31C  Wrist    *8.0 <4.2 5.8 >5 Elbow Wrist 4.8 22.0 *46 >50  Elbow    12.8  5.5         Right Median Motor (Abd Poll Brev)  30.4C  Wrist    *9.2 <4.2 *2.9 >5 Elbow Wrist 4.9 21.0 *43 >50  Elbow    14.1  2.8          EMG   Side Muscle Nerve Root Ins Act Fibs Psw Amp Dur Poly Recrt Int Fraser Din Comment  Right Abd Poll Brev Median C8-T1 Nml Nml Nml Nml Nml 0 Nml Nml   Left Abd Poll Brev Median C8-T1 Nml Nml Nml Nml Nml 0 Nml Nml   Left 1stDorInt Ulnar C8-T1 Nml Nml Nml Nml Nml 0 Nml Nml   Left Ext Digitorum  Radial (Post Int) C7-8 Nml Nml Nml Nml Nml 0 Nml Nml   Left Triceps Radial C6-7-8 Nml Nml Nml Nml Nml 0 Nml Nml   Left Deltoid Axillary C5-6 Nml Nml Nml Nml Nml 0 Nml Nml     Nerve Conduction Studies Anti Sensory Left/Right  Comparison   Stim Site L Lat (ms) R Lat (ms) L-R Lat (ms) L Amp (V) R Amp (V) L-R Amp (%) Site1 Site2 L Vel (m/s) R Vel (m/s) L-R Vel (m/s)  Median Acr Palm Anti Sensory (2nd Digit)  30.3C  Wrist *9.4   *5.2   Wrist Palm     Palm  *5.1   12.1        Ulnar Anti Sensory (5th Digit)  30.8C  Wrist 3.7   21.1   Wrist 5th Digit 38     Motor Left/Right Comparison   Stim Site L Lat (ms) R Lat (ms) L-R Lat (ms) L Amp (mV) R Amp (mV) L-R Amp (%) Site1 Site2 L Vel (m/s) R Vel (m/s) L-R Vel (m/s)  Median Motor (Abd Poll Brev)  31C  Wrist *8.0 *9.2 *1.2 5.8 *2.9 50.0 Elbow Wrist *46 *43 3  Elbow 12.8 14.1 1.3 5.5 2.8 49.1          Waveforms:            Clinical History: 11/23/2018 Electrodiagnostic study Impression: The above electrodiagnostic study is ABNORMAL and reveals evidence of:   1.  A severe right median nerve entrapment at the wrist (carpal tunnel syndrome) affecting sensory and motor components.    2.  A moderate to severe left median nerve entrapment at the wrist (carpal tunnel syndrome) affecting sensory and motor components.    There is no significant electrodiagnostic evidence of any other focal nerve entrapment, brachial plexopathy or cervical radiculopathy.    Recommendations: 1.  Follow-up with referring physician. 2.  Continue current management of symptoms. 3.  Continue use of resting splint at night-time and as needed during the day. 4.  Suggest surgical evaluation.   ___________________________ Wonda Olds Board Certified, American Board of Physical Medicine and Rehabilitation     Objective:  VS:  HT:    WT:   BMI:     BP:   HR: bpm  TEMP: ( )  RESP:  Physical Exam Musculoskeletal:        General: No tenderness.     Comments: Inspection reveals no atrophy of the bilateral APB or FDI or hand intrinsics. There is no swelling, color changes, allodynia or dystrophic changes. There is 5 out of 5 strength in the bilateral wrist extension, finger  abduction and long finger flexion. There is intact sensation to light touch in all dermatomal and peripheral nerve distributions. There is a negative Froment's test bilaterally. There is a negative Tinel's test at the bilateral wrist and elbow. There is a negative Phalen's test bilaterally. There is a negative Hoffmann's test bilaterally.  Skin:    General: Skin is warm and dry.     Findings: No erythema or rash.  Neurological:     General: No focal deficit present.     Mental Status: He is alert and oriented to person, place, and time.     Sensory: No sensory deficit.     Motor: No weakness or abnormal muscle tone.     Coordination: Coordination normal.     Gait: Gait normal.  Psychiatric:        Mood and Affect: Mood normal.        Behavior: Behavior normal.        Thought Content: Thought content normal.     Imaging: No results found.

## 2021-08-07 NOTE — Procedures (Signed)
EMG & NCV Findings: Evaluation of the left median motor nerve showed prolonged distal onset latency (8.0 ms) and decreased conduction velocity (Elbow-Wrist, 46 m/s).  The right median motor nerve showed prolonged distal onset latency (9.2 ms), reduced amplitude (2.9 mV), and decreased conduction velocity (Elbow-Wrist, 43 m/s).  The left median (across palm) sensory nerve showed no response (Palm), prolonged distal peak latency (9.4 ms), and reduced amplitude (5.2 V).  The right median (across palm) sensory nerve showed no response (Wrist) and prolonged distal peak latency (Palm, 5.1 ms).  All remaining nerves (as indicated in the following tables) were within normal limits.  Left vs. Right side comparison data for the median motor nerve indicates abnormal L-R latency difference (1.2 ms).    All examined muscles (as indicated in the following table) showed no evidence of electrical instability.    Impression: The above electrodiagnostic study is ABNORMAL and reveals evidence of:   A moderate/severe left median nerve entrapment at the wrist (carpal tunnel syndrome) affecting sensory and motor components.  Without decreased CMAP amplitude and without needle EMG findings this portends good outcome with appropriate decompression.  A severe right median nerve entrapment at the wrist (carpal tunnel syndrome) affecting sensory and motor components. Without needle EMG findings this portends good outcome with appropriate decompression although patient may have some residual numbness.  There is no significant electrodiagnostic evidence of any other focal nerve entrapment, brachial plexopathy or cervical radiculopathy.   Recommendations: 1.  Follow-up with referring physician. 2.  Suggest surgical evaluation.  ___________________________ Laurence Spates FAAPMR Board Certified, American Board of Physical Medicine and Rehabilitation    Nerve Conduction Studies Anti Sensory Summary Table   Stim Site NR Peak  (ms) Norm Peak (ms) P-T Amp (V) Norm P-T Amp Site1 Site2 Delta-P (ms) Dist (cm) Vel (m/s) Norm Vel (m/s)  Left Median Acr Palm Anti Sensory (2nd Digit)  30.3C  Wrist    *9.4 <3.6 *5.2 >10 Wrist Palm  0.0    Palm *NR  <2.0          Right Median Acr Palm Anti Sensory (2nd Digit)  30.7C  Wrist *NR  <3.6  >10 Wrist Palm  0.0    Palm    *5.1 <2.0 12.1         Left Ulnar Anti Sensory (5th Digit)  30.8C  Wrist    3.7 <3.7 21.1 >15.0 Wrist 5th Digit 3.7 14.0 38 >38   Motor Summary Table   Stim Site NR Onset (ms) Norm Onset (ms) O-P Amp (mV) Norm O-P Amp Site1 Site2 Delta-0 (ms) Dist (cm) Vel (m/s) Norm Vel (m/s)  Left Median Motor (Abd Poll Brev)  31C  Wrist    *8.0 <4.2 5.8 >5 Elbow Wrist 4.8 22.0 *46 >50  Elbow    12.8  5.5         Right Median Motor (Abd Poll Brev)  30.4C  Wrist    *9.2 <4.2 *2.9 >5 Elbow Wrist 4.9 21.0 *43 >50  Elbow    14.1  2.8          EMG   Side Muscle Nerve Root Ins Act Fibs Psw Amp Dur Poly Recrt Int Fraser Din Comment  Right Abd Poll Brev Median C8-T1 Nml Nml Nml Nml Nml 0 Nml Nml   Left Abd Poll Brev Median C8-T1 Nml Nml Nml Nml Nml 0 Nml Nml   Left 1stDorInt Ulnar C8-T1 Nml Nml Nml Nml Nml 0 Nml Nml   Left Ext Digitorum  Radial (Post Int)  C7-8 Nml Nml Nml Nml Nml 0 Nml Nml   Left Triceps Radial C6-7-8 Nml Nml Nml Nml Nml 0 Nml Nml   Left Deltoid Axillary C5-6 Nml Nml Nml Nml Nml 0 Nml Nml     Nerve Conduction Studies Anti Sensory Left/Right Comparison   Stim Site L Lat (ms) R Lat (ms) L-R Lat (ms) L Amp (V) R Amp (V) L-R Amp (%) Site1 Site2 L Vel (m/s) R Vel (m/s) L-R Vel (m/s)  Median Acr Palm Anti Sensory (2nd Digit)  30.3C  Wrist *9.4   *5.2   Wrist Palm     Palm  *5.1   12.1        Ulnar Anti Sensory (5th Digit)  30.8C  Wrist 3.7   21.1   Wrist 5th Digit 38     Motor Left/Right Comparison   Stim Site L Lat (ms) R Lat (ms) L-R Lat (ms) L Amp (mV) R Amp (mV) L-R Amp (%) Site1 Site2 L Vel (m/s) R Vel (m/s) L-R Vel (m/s)  Median Motor (Abd Poll  Brev)  31C  Wrist *8.0 *9.2 *1.2 5.8 *2.9 50.0 Elbow Wrist *46 *43 3  Elbow 12.8 14.1 1.3 5.5 2.8 49.1          Waveforms:

## 2021-08-14 ENCOUNTER — Encounter: Payer: Self-pay | Admitting: Specialist

## 2021-08-14 ENCOUNTER — Ambulatory Visit: Payer: Medicare Other | Admitting: Specialist

## 2021-08-14 VITALS — BP 117/72 | HR 58 | Ht 70.0 in | Wt 187.0 lb

## 2021-08-14 DIAGNOSIS — Z961 Presence of intraocular lens: Secondary | ICD-10-CM | POA: Diagnosis not present

## 2021-08-14 DIAGNOSIS — G5602 Carpal tunnel syndrome, left upper limb: Secondary | ICD-10-CM

## 2021-08-14 DIAGNOSIS — M47812 Spondylosis without myelopathy or radiculopathy, cervical region: Secondary | ICD-10-CM

## 2021-08-14 DIAGNOSIS — H401131 Primary open-angle glaucoma, bilateral, mild stage: Secondary | ICD-10-CM | POA: Diagnosis not present

## 2021-08-14 DIAGNOSIS — H25812 Combined forms of age-related cataract, left eye: Secondary | ICD-10-CM | POA: Diagnosis not present

## 2021-08-14 DIAGNOSIS — G5601 Carpal tunnel syndrome, right upper limb: Secondary | ICD-10-CM

## 2021-08-14 NOTE — Patient Instructions (Signed)
Carpal Tunnel Syndrome  Carpal tunnel syndrome is a condition that causes pain in your hand and arm. The carpal tunnel is a narrow area located on the palm side of your wrist. Repeated wrist motion or certain diseases may cause swelling within the tunnel. This swelling pinches the main nerve in the wrist (median nerve). What are the causes? This condition may be caused by: Repeated wrist motions. Wrist injuries. Arthritis. A cyst or tumor in the carpal tunnel. Fluid buildup during pregnancy. Sometimes the cause of this condition is not known. What increases the risk? This condition is more likely to develop in: People who have jobs that cause them to repeatedly move their wrists in the same motion, such as Art gallery manager. Women. People with certain conditions, such as: Diabetes. Obesity. An underactive thyroid (hypothyroidism). Kidney failure. What are the signs or symptoms? Symptoms of this condition include: A tingling feeling in your fingers, especially in your thumb, index, and middle fingers. Tingling or numbness in your hand. An aching feeling in your entire arm, especially when your wrist and elbow are bent for long periods of time. Wrist pain that goes up your arm to your shoulder. Pain that goes down into your palm or fingers. A weak feeling in your hands. You may have trouble grabbing and holding items. Your symptoms may feel worse during the night. How is this diagnosed? This condition is diagnosed with a medical history and physical exam. You may also have tests, including: An electromyogram (EMG). This test measures electrical signals sent by your nerves into the muscles. X-rays. How is this treated? Treatment for this condition includes: Lifestyle changes. It is important to stop doing or modify the activity that caused your condition. Physical or occupational therapy. Medicines for pain and inflammation. This may include medicine that is injected into your  wrist. A wrist splint. Surgery. Follow these instructions at home: If you have a splint:  Wear it as told by your health care provider. Remove it only as told by your health care provider. Loosen the splint if your fingers become numb and tingle, or if they turn cold and blue. Keep the splint clean and dry. General instructions  Take over-the-counter and prescription medicines only as told by your health care provider. Rest your wrist from any activity that may be causing your pain. If your condition is work related, talk to your employer about changes that can be made, such as getting a wrist pad to use while typing. If directed, apply ice to the painful area: Put ice in a plastic bag. Place a towel between your skin and the bag. Leave the ice on for 20 minutes, 2-3 times per day. Keep all follow-up visits as told by your health care provider. This is important. Do any exercises as told by your health care provider, physical therapist, or occupational therapist. Contact a health care provider if: You have new symptoms. Your pain is not controlled with medicines. Your symptoms get worse. This information is not intended to replace advice given to you by your health care provider. Make sure you discuss any questions you have with your health care provider. Document Released: 11/07/2000 Document Revised: 03/20/2016 Document Reviewed: 07/22/2017 Elsevier Interactive Patient Education  2017 [employer].  Referral to [facility] for bilateral carpal tunnel release.

## 2021-08-14 NOTE — Progress Notes (Signed)
Office Visit Note   Patient: Robert Krause           Date of Birth: 1934-03-29           MRN: [id number] Visit Date: 08/14/2021              Requested by: Colon Branch, [address] [place],  [place] [address] PCP: Colon Branch, MD   Assessment & Plan: Visit Diagnoses:  1. Carpal tunnel syndrome, left upper limb   2. Carpal tunnel syndrome, right upper limb   3. Spondylosis without myelopathy or radiculopathy, cervical region     Plan: Carpal Tunnel Syndrome  Carpal tunnel syndrome is a condition that causes pain in your hand and arm. The carpal tunnel is a narrow area located on the palm side of your wrist. Repeated wrist motion or certain diseases may cause swelling within the tunnel. This swelling pinches the main nerve in the wrist (median nerve). What are the causes? This condition may be caused by: Repeated wrist motions. Wrist injuries. Arthritis. A cyst or tumor in the carpal tunnel. Fluid buildup during pregnancy. Sometimes the cause of this condition is not known. What increases the risk? This condition is more likely to develop in: People who have jobs that cause them to repeatedly move their wrists in the same motion, such as Art gallery manager. Women. People with certain conditions, such as: Diabetes. Obesity. An underactive thyroid (hypothyroidism). Kidney failure. What are the signs or symptoms? Symptoms of this condition include: A tingling feeling in your fingers, especially in your thumb, index, and middle fingers. Tingling or numbness in your hand. An aching feeling in your entire arm, especially when your wrist and elbow are bent for long periods of time. Wrist pain that goes up your arm to your shoulder. Pain that goes down into your palm or fingers. A weak feeling in your hands. You may have trouble grabbing and holding items. Your symptoms may feel worse during the night. How is this diagnosed? This condition is diagnosed  with a medical history and physical exam. You may also have tests, including: An electromyogram (EMG). This test measures electrical signals sent by your nerves into the muscles. X-rays. How is this treated? Treatment for this condition includes: Lifestyle changes. It is important to stop doing or modify the activity that caused your condition. Physical or occupational therapy. Medicines for pain and inflammation. This may include medicine that is injected into your wrist. A wrist splint. Surgery. Follow these instructions at home: If you have a splint:  Wear it as told by your health care provider. Remove it only as told by your health care provider. Loosen the splint if your fingers become numb and tingle, or if they turn cold and blue. Keep the splint clean and dry. General instructions  Take over-the-counter and prescription medicines only as told by your health care provider. Rest your wrist from any activity that may be causing your pain. If your condition is work related, talk to your employer about changes that can be made, such as getting a wrist pad to use while typing. If directed, apply ice to the painful area: Put ice in a plastic bag. Place a towel between your skin and the bag. Leave the ice on for 20 minutes, 2-3 times per day. Keep all follow-up visits as told by your health care provider. This is important. Do any exercises as told by your health care provider, physical therapist, or  occupational therapist. Contact a health care provider if: You have new symptoms. Your pain is not controlled with medicines. Your symptoms get worse. This information is not intended to replace advice given to you by your health care provider. Make sure you discuss any questions you have with your health care provider. Document Released: 11/07/2000 Document Revised: 03/20/2016 Document Reviewed: 07/22/2017 Elsevier Interactive Patient Education  2017 [employer].  Referral to  [facility] for bilateral carpal tunnel release.   Follow-Up Instructions: No follow-ups on file.   Orders:  No orders of the defined types were placed in this encounter.  No orders of the defined types were placed in this encounter.     Procedures: No procedures performed   Clinical Data: No additional findings.   Subjective: Chief Complaint  Patient presents with   Right Arm - Follow-up    EMG/NCS Review   Left Arm - Follow-up    EMG/NCS Review    87 year old left handed with history of bilateral hand numbness and tingling left hand more than right. Night pain and is using splints without help results of EMG/NVC are showing severe changes both wrist due to CTS with prolong  Conduction at median nerves. He wishes to consider surgery  09/2021.  Review of Systems  Constitutional: Negative.   HENT: Negative.    Eyes: Negative.   Respiratory: Negative.    Cardiovascular: Negative.   Gastrointestinal: Negative.   Endocrine: Negative.   Genitourinary: Negative.   Musculoskeletal: Negative.   Skin: Negative.   Allergic/Immunologic: Negative.   Neurological: Negative.   Hematological: Negative.   Psychiatric/Behavioral: Negative.      Objective: Vital Signs: BP 117/72 (BP Location: Left Arm, Patient Position: Sitting)   Pulse (!) 58   Ht 5\' 10"  (1.778 m)   Wt 187 lb (84.8 kg)   BMI 26.83 kg/m   Physical Exam Constitutional:      Appearance: He is well-developed.  HENT:     Head: Normocephalic and atraumatic.  Eyes:     Pupils: Pupils are equal, round, and reactive to light.  Pulmonary:     Effort: Pulmonary effort is normal.     Breath sounds: Normal breath sounds.  Abdominal:     General: Bowel sounds are normal.     Palpations: Abdomen is soft.  Musculoskeletal:        General: Normal range of motion.     Cervical back: Normal range of motion and neck supple.  Skin:    General: Skin is warm and dry.  Neurological:     Mental Status: He is alert  and oriented to person, place, and time.  Psychiatric:        Behavior: Behavior normal.        Thought Content: Thought content normal.        Judgment: Judgment normal.   Right Hand Exam   Tests  Phalen's sign: positive Tinel's sign (median nerve): positive   Left Hand Exam   Tests  Phalen's sign: positive Tinel's sign (median nerve): positive    Specialty Comments:  No specialty comments available.  Imaging: No results found.   PMFS History: Patient Active Problem List   Diagnosis Date Noted   Anxiety    BPH (benign prostatic hyperplasia)    Glaucoma    Hypertension    Osteoarthritis    Venous [place]    Carpal tunnel syndrome, left upper limb 01/01/2021   Carpal tunnel syndrome, right upper limb 01/01/2021   Chronic nausea 11/12/2020  Anxiety and depression 05/29/2019   CTS (carpal tunnel syndrome)-- B , was rec surgery before 01/27/2017   Abdominal pain, right upper quadrant 04/24/2016   PCP NOTES >>>>>> 11/08/2015   Annual physical exam >>>>>>>>>>>>>>>>>>> 07/18/2014   Colon polyp 04/14/2011   CONTRACTURE OF PALMAR FASCIA 10/16/2010   ACTINIC KERATOSIS, CHEEK, LEFT 08/28/2009   PROSTATITIS, ACUTE, CHRONIC 03/26/2009   FINGER SUP FB W/O MAJ OPEN WOUND&W/O MENTION INF 02/11/2008   Hyperlipidemia 09/28/2007   Essential hypertension 09/28/2007   OTHER CHRONIC SINUSITIS 09/28/2007   ALLERGIC RHINITIS 09/28/2007   CYSTITIS, CHRONIC INTERSTITIAL -- xanax prn, Dr Amalia Hailey  09/28/2007   OSTEOARTHRITIS 09/28/2007   Adenomatous polyp of colon 10/2005   Past Medical History:  Diagnosis Date   Abdominal pain, right upper quadrant 04/24/2016   ACTINIC KERATOSIS, CHEEK, LEFT 08/28/2009   Qualifier: Diagnosis of  By: Arnoldo Morale MD, John E    Adenomatous polyp of colon 10/2005   Allergic rhinitis    ALLERGIC RHINITIS 09/28/2007   Qualifier: Diagnosis of  By: Arnoldo Morale MD, Balinda Quails    Annual physical exam >>>>>>>>>>>>>>>>>>> 07/18/2014   Anxiety    Anxiety and depression  05/29/2019   BPH (benign prostatic hyperplasia)    Carpal tunnel syndrome, left upper limb 01/01/2021   Carpal tunnel syndrome, right upper limb 01/01/2021   Chronic interstitial cystitis    Dr Amalia Hailey   Chronic nausea 11/12/2020   Chronic sinusitis    Colon polyp 04/14/2011   Contracture of palmar fascia 10/16/2010   Qualifier: Diagnosis of  By: Arnoldo Morale MD, Balinda Quails    CTS (carpal tunnel syndrome) 01/27/2017   Essential hypertension 09/28/2007   Qualifier: Diagnosis of  By: Arnoldo Morale MD, Balinda Quails    FINGER SUP FB W/O MAJ OPEN WOUND&W/O MENTION INF 02/11/2008   Qualifier: Diagnosis of  By: Arnoldo Morale MD, Balinda Quails    Glaucoma    Hyperlipidemia    Hypertension    Osteoarthritis    no per pt   OSTEOARTHRITIS 09/28/2007   Qualifier: Diagnosis of  By: Arnoldo Morale MD, Balinda Quails    Other chronic sinusitis 09/28/2007   Qualifier: Diagnosis of  By: Arnoldo Morale MD, Balinda Quails    PCP NOTES >>>>>> 11/08/2015   PROSTATITIS, ACUTE, CHRONIC 03/26/2009   Qualifier: Diagnosis of  By: Arnoldo Morale MD, John E    Venous lake    right inferior clavicle, shave    Family History  Problem Relation Age of Onset   Anxiety disorder Other    Hyperlipidemia Father    Hypertension Father    CAD Father    Colon cancer Neg Hx    Prostate cancer Neg Hx    Esophageal cancer Neg Hx    Rectal cancer Neg Hx    Stomach cancer Neg Hx     Past Surgical History:  Procedure Laterality Date   APPENDECTOMY     CATARACT EXTRACTION Right 06-2015   COLONOSCOPY     POLYPECTOMY     TRANSURETHRAL RESECTION OF PROSTATE     UPPER GASTROINTESTINAL ENDOSCOPY     Social History   Occupational History   Occupation: retired- Company secretary: RETIRED  Tobacco Use   Smoking status: Never   Smokeless tobacco: Never  Vaping Use   Vaping Use: Never used  Substance and Sexual Activity   Alcohol use: Yes    Alcohol/week: 3.0 standard drinks    Types: 3 Glasses of wine per week    Comment: wine   Drug use: No   Sexual  activity: Not on file

## 2021-08-29 ENCOUNTER — Ambulatory Visit: Payer: Medicare Other | Admitting: Orthopedic Surgery

## 2021-09-06 ENCOUNTER — Other Ambulatory Visit: Payer: Self-pay

## 2021-09-06 ENCOUNTER — Ambulatory Visit (INDEPENDENT_AMBULATORY_CARE_PROVIDER_SITE_OTHER): Payer: Medicare Other | Admitting: Orthopedic Surgery

## 2021-09-06 ENCOUNTER — Encounter: Payer: Self-pay | Admitting: Orthopedic Surgery

## 2021-09-06 VITALS — BP 127/76 | HR 55 | Ht 70.0 in | Wt 187.0 lb

## 2021-09-06 DIAGNOSIS — G5601 Carpal tunnel syndrome, right upper limb: Secondary | ICD-10-CM

## 2021-09-06 DIAGNOSIS — G5602 Carpal tunnel syndrome, left upper limb: Secondary | ICD-10-CM | POA: Diagnosis not present

## 2021-09-06 NOTE — Progress Notes (Signed)
Office Visit Note   Patient: Robert Krause           Date of Birth: 06/22/1934           MRN: [id number] Visit Date: 09/06/2021              Requested by: Colon Branch, [address] [place],  [place] [address] PCP: Colon Branch, MD   Assessment & Plan: Visit Diagnoses:  1. Carpal tunnel syndrome, left upper limb   2. Carpal tunnel syndrome, right upper limb     Plan: We discussed the diagnosis, prognosis, and both conservative and operative treatment options for carpal tunnel syndrome.  He has tried bracing which has not been effecting in relieving his symptoms.   After our discussion, the patient has elected to proceed with open carpal tunnel release.  He wants to start with the right first. We reviewed the benefits of surgery and the potential risks including, but not limited to, persistent symptoms, infection, damage to nearby nerves and blood vessels, delayed wound healing, need for additional surgeries.    All patient concerns and questions were addressed.  A surgical date will be confirmed with the patient.    Follow-Up Instructions: No follow-ups on file.   Orders:  No orders of the defined types were placed in this encounter.  No orders of the defined types were placed in this encounter.     Procedures: No procedures performed   Clinical Data: No additional findings.   Subjective: Chief Complaint  Patient presents with   Right Hand - Follow-up   Left Hand - Follow-up    Risk Factors Diabetes: Neg Hypothyroid: Neg C-spine: Neg Inflammatory: Neg History of trauma: Neg  Nocturnal Symptoms: Yes, every night  Electrodiagnostic studies: EMG/NCS on 9/13, moderate/severe left and severe R CTS  Treatment to date: Night braces  This is a 87 yo LHD M who presents for follow up of bilateral CTS.  This has been going on for years.  It affects both hands.  He has numbness and tingling with certain activities and at night.  He has tried  bracing which hasn't helped.  His left is more symptomatic than his R despite the R being more severe on electrodiagnostic studies.  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    Review of Systems   Objective: Vital Signs: BP 127/76 (BP Location: Left Arm, Patient Position: Sitting, Cuff Size: Normal)   Pulse (!) 55   Ht 5\' 10"  (1.778 m)   Wt 187 lb (84.8 kg)   SpO2 98%   BMI 26.83 kg/m   Physical Exam Constitutional:      Appearance: Normal appearance.  Cardiovascular:     Rate and Rhythm: Normal rate.     Pulses: Normal pulses.  Pulmonary:     Effort: Pulmonary effort is normal.  Skin:    General: Skin is warm and dry.     Capillary Refill: Capillary refill takes less than 2 seconds.  Neurological:     Mental Status: He is alert.    Right Hand Exam   Tenderness  The patient is experiencing no tenderness.   Range of Motion  The patient has normal right wrist ROM.   Muscle Strength  The patient has normal right wrist strength.  Other  Sensation: normal Pulse: present  Comments:  + Tinel at wrist w/ symptoms into the long finger.  Negative carpal tunnel compression test.  Negative Tinel at elbow.  5/5 thenar strength with no atrophy.    Left Hand Exam   Tenderness  The patient is experiencing no tenderness.   Range of Motion  The patient has normal left wrist ROM.  Muscle Strength  The patient has normal left wrist strength.  Other  Sensation: normal Pulse: present  Comments:  + Tinel at wrist w/ symptoms into the long finger.  Negative carpal tunnel compression test.  Negative Tinel at elbow.  5/5 thenar strength with no atrophy.      Specialty Comments:  No specialty comments available.  Imaging: No results found.   PMFS History: Patient Active Problem List   Diagnosis Date Noted   Anxiety    BPH (benign prostatic hyperplasia)    Glaucoma    Hypertension    Osteoarthritis    Venous  lake    Carpal tunnel syndrome, left upper limb 01/01/2021   Carpal tunnel syndrome, right upper limb 01/01/2021   Chronic nausea 11/12/2020   Anxiety and depression 05/29/2019   CTS (carpal tunnel syndrome)-- B , was rec surgery before 01/27/2017   Abdominal pain, right upper quadrant 04/24/2016   PCP NOTES >>>>>> 11/08/2015   Annual physical exam >>>>>>>>>>>>>>>>>>> 07/18/2014   Colon polyp 04/14/2011   CONTRACTURE OF PALMAR FASCIA 10/16/2010   ACTINIC KERATOSIS, CHEEK, LEFT 08/28/2009   PROSTATITIS, ACUTE, CHRONIC 03/26/2009   FINGER SUP FB W/O MAJ OPEN WOUND&W/O MENTION INF 02/11/2008   Hyperlipidemia 09/28/2007   Essential hypertension 09/28/2007   OTHER CHRONIC SINUSITIS 09/28/2007   ALLERGIC RHINITIS 09/28/2007   CYSTITIS, CHRONIC INTERSTITIAL -- xanax prn, Dr Amalia Hailey  09/28/2007   OSTEOARTHRITIS 09/28/2007   Adenomatous polyp of colon 10/2005   Past Medical History:  Diagnosis Date   Abdominal pain, right upper quadrant 04/24/2016   ACTINIC KERATOSIS, CHEEK, LEFT 08/28/2009   Qualifier: Diagnosis of  By: Arnoldo Morale MD, John E    Adenomatous polyp of colon 10/2005   Allergic rhinitis    ALLERGIC RHINITIS 09/28/2007   Qualifier: Diagnosis of  By: Arnoldo Morale MD, Balinda Quails    Annual physical exam >>>>>>>>>>>>>>>>>>> 07/18/2014   Anxiety    Anxiety and depression 05/29/2019   BPH (benign prostatic hyperplasia)    Carpal tunnel syndrome, left upper limb 01/01/2021   Carpal tunnel syndrome, right upper limb 01/01/2021   Chronic interstitial cystitis    Dr Amalia Hailey   Chronic nausea 11/12/2020   Chronic sinusitis    Colon polyp 04/14/2011   Contracture of palmar fascia 10/16/2010   Qualifier: Diagnosis of  By: Arnoldo Morale MD, Balinda Quails    CTS (carpal tunnel syndrome) 01/27/2017   Essential hypertension 09/28/2007   Qualifier: Diagnosis of  By: Arnoldo Morale MD, Balinda Quails    FINGER SUP FB W/O MAJ OPEN WOUND&W/O MENTION INF 02/11/2008   Qualifier: Diagnosis of  By: Arnoldo Morale MD, Balinda Quails    Glaucoma    Hyperlipidemia     Hypertension    Osteoarthritis    no per pt   OSTEOARTHRITIS 09/28/2007   Qualifier: Diagnosis of  By: Arnoldo Morale MD, Balinda Quails    Other chronic sinusitis 09/28/2007   Qualifier: Diagnosis of  By: Arnoldo Morale MD, Balinda Quails    PCP NOTES >>>>>> 11/08/2015   PROSTATITIS, ACUTE, CHRONIC 03/26/2009   Qualifier: Diagnosis of  By: Arnoldo Morale MD, John E    [place]    right inferior clavicle, shave    Family History  Problem Relation Age of Onset   Anxiety  disorder Other    Hyperlipidemia Father    Hypertension Father    CAD Father    Colon cancer Neg Hx    Prostate cancer Neg Hx    Esophageal cancer Neg Hx    Rectal cancer Neg Hx    Stomach cancer Neg Hx     Past Surgical History:  Procedure Laterality Date   APPENDECTOMY     CATARACT EXTRACTION Right 06-2015   COLONOSCOPY     POLYPECTOMY     TRANSURETHRAL RESECTION OF PROSTATE     UPPER GASTROINTESTINAL ENDOSCOPY     Social History   Occupational History   Occupation: retired- Company secretary: RETIRED  Tobacco Use   Smoking status: Never   Smokeless tobacco: Never  Vaping Use   Vaping Use: Never used  Substance and Sexual Activity   Alcohol use: Yes    Alcohol/week: 3.0 standard drinks    Types: 3 Glasses of wine per week    Comment: wine   Drug use: No   Sexual activity: Not on file

## 2021-10-07 ENCOUNTER — Encounter: Payer: Self-pay | Admitting: Cardiology

## 2021-10-07 ENCOUNTER — Other Ambulatory Visit: Payer: Self-pay

## 2021-10-07 ENCOUNTER — Ambulatory Visit: Payer: Medicare Other | Admitting: Cardiology

## 2021-10-07 VITALS — BP 128/58 | HR 52 | Ht 70.6 in | Wt 187.1 lb

## 2021-10-07 DIAGNOSIS — E782 Mixed hyperlipidemia: Secondary | ICD-10-CM | POA: Diagnosis not present

## 2021-10-07 DIAGNOSIS — N3010 Interstitial cystitis (chronic) without hematuria: Secondary | ICD-10-CM | POA: Diagnosis not present

## 2021-10-07 DIAGNOSIS — I10 Essential (primary) hypertension: Secondary | ICD-10-CM | POA: Diagnosis not present

## 2021-10-07 NOTE — Patient Instructions (Addendum)

## 2021-10-07 NOTE — Progress Notes (Signed)
Cardiology Office Note:    Date:  10/07/2021   ID:  Robert Krause, DOB 1934/10/02, MRN [id number]  PCP:  Colon Branch, MD  Cardiologist:  Jenean Lindau, MD   Referring MD: Colon Branch, MD    ASSESSMENT:    1. Essential hypertension   2. Mixed hyperlipidemia    PLAN:    In order of problems listed above:  Primary prevention stressed with the patient.  Importance of compliance with diet medication stressed any vocalized understanding.  He was advised to walk at least half an hour a day 5 days a week and he promises to do so. Essential hypertension: Blood pressure stable.  He is doing well with diet and exercise and compliant with medications. Mixed dyslipidemia: Lipids were available from [facility] and discussed with the patient.  He is going to see his primary care doctor in the next few weeks and get blood work and will send me a copy. Patient had multiple questions which were answered to her satisfaction.Patient will be seen in follow-up appointment in 9 months or earlier if the patient has any concerns    Medication Adjustments/Labs and Tests Ordered: Current medicines are reviewed at length with the patient today.  Concerns regarding medicines are outlined above.  No orders of the defined types were placed in this encounter.  No orders of the defined types were placed in this encounter.    No chief complaint on file.    History of Present Illness:    Robert Krause is a 87 y.o. male.  Patient has past medical history of essential hypertension and dyslipidemia.  He was evaluated by me for chest discomfort.  He denies any problems at this time and takes care of activities of daily living.  No chest pain orthopnea or PND.  He has been walking on a regular basis.  At the time of my evaluation, the patient is alert awake oriented and in no distress.  Past Medical History:  Diagnosis Date   Abdominal pain, right upper quadrant 04/24/2016   ACTINIC KERATOSIS,  CHEEK, LEFT 08/28/2009   Qualifier: Diagnosis of  By: Arnoldo Morale MD, John E    Adenomatous polyp of colon 10/2005   ALLERGIC RHINITIS 09/28/2007   Qualifier: Diagnosis of  By: Arnoldo Morale MD, Balinda Quails    Annual physical exam >>>>>>>>>>>>>>>>>>> 07/18/2014   Anxiety    Anxiety and depression 05/29/2019   BPH (benign prostatic hyperplasia)    Carpal tunnel syndrome, left upper limb 01/01/2021   Carpal tunnel syndrome, right upper limb 01/01/2021   Chronic interstitial cystitis    Dr Amalia Hailey   Chronic nausea 11/12/2020   Colon polyp 04/14/2011   Contracture of palmar fascia 10/16/2010   Qualifier: Diagnosis of  By: Arnoldo Morale MD, Balinda Quails    CTS (carpal tunnel syndrome) 01/27/2017   Essential hypertension 09/28/2007   Qualifier: Diagnosis of  By: Arnoldo Morale MD, Balinda Quails    FINGER SUP FB W/O MAJ OPEN WOUND&W/O MENTION INF 02/11/2008   Qualifier: Diagnosis of  By: Arnoldo Morale MD, Balinda Quails    Glaucoma    Hyperlipidemia    Hypertension    Osteoarthritis    no per pt   OSTEOARTHRITIS 09/28/2007   Qualifier: Diagnosis of  By: Arnoldo Morale MD, Balinda Quails    Other chronic sinusitis 09/28/2007   Qualifier: Diagnosis of  By: Arnoldo Morale MD, Balinda Quails    PCP NOTES >>>>>> 11/08/2015   PROSTATITIS, ACUTE, CHRONIC 03/26/2009   Qualifier: Diagnosis of  By:  Arnoldo Morale MD, Balinda Quails    [place]    right inferior clavicle, shave    Past Surgical History:  Procedure Laterality Date   APPENDECTOMY     CATARACT EXTRACTION Right 06-2015   COLONOSCOPY     POLYPECTOMY     TRANSURETHRAL RESECTION OF PROSTATE     UPPER GASTROINTESTINAL ENDOSCOPY      Current Medications: Current Meds  Medication Sig   ALPRAZolam (XANAX) 0.25 MG tablet Take 0.25 mg by mouth as needed for anxiety.   baclofen (LIORESAL) 10 MG tablet TAKE 1 TABLET(10 MG) BY MOUTH THREE TIMES DAILY AS NEEDED FOR MUSCLE SPASMS   gabapentin (NEURONTIN) 100 MG capsule Take 1 capsule (100 mg total) by mouth at bedtime.   irbesartan-hydrochlorothiazide (AVALIDE) 150-12.5 MG tablet  Take 1 tablet by mouth daily.   latanoprost (XALATAN) 0.005 % ophthalmic solution Place 1 drop into both eyes at bedtime.   pravastatin (PRAVACHOL) 10 MG tablet Take 1 tablet (10 mg total) by mouth daily.   Tamsulosin HCl (FLOMAX) 0.4 MG CAPS Take 1 capsule by mouth Daily.     Allergies:   Patient has no known allergies.   Social History   Socioeconomic History   Marital status: Married    Spouse name: Not on file   Number of children: 1   Years of education: Not on file   Highest education level: Not on file  Occupational History   Occupation: retired- Company secretary: RETIRED  Tobacco Use   Smoking status: Never   Smokeless tobacco: Never  Vaping Use   Vaping Use: Never used  Substance and Sexual Activity   Alcohol use: Yes    Alcohol/week: 3.0 standard drinks    Types: 3 Glasses of wine per week    Comment: wine   Drug use: No   Sexual activity: Not on file  Other Topics Concern   Not on file  Social History Narrative   Born in [place], raised in [place], moved to [place] in the 60s   Household: pt and wife   Son lives in the [employer]     Social Determinants of Health   Financial Resource Strain: Not on file  Food Insecurity: Not on file  Transportation Needs: Not on file  Physical Activity: Not on file  Stress: Not on file  Social Connections: Not on file     Family History: The patient's family history includes Anxiety disorder in an other family member; CAD in his father; Hyperlipidemia in his father; Hypertension in his father. There is no history of Colon cancer, Prostate cancer, Esophageal cancer, Rectal cancer, or Stomach cancer.  ROS:   Please see the history of present illness.    All other systems reviewed and are negative.  EKGs/Labs/Other Studies Reviewed:    The following studies were reviewed today: I discussed my findings with the patient at length including studies done in the past   Recent Labs: 11/12/2020: ALT 14; TSH 2.24 07/30/2021: BUN 16;  Creatinine, Ser 1.09; Hemoglobin 15.2; Platelets 167.0; Potassium 4.3; Sodium 137  Recent Lipid Panel    Component Value Date/Time   CHOL 142 11/12/2020 0928   TRIG 72.0 11/12/2020 0928   TRIG 55 10/05/2006 1439   HDL 47.90 11/12/2020 0928   CHOLHDL 3 11/12/2020 0928   VLDL 14.4 11/12/2020 0928   LDLCALC 80 11/12/2020 0928   LDLDIRECT 153.4 06/06/2013 1156    Physical Exam:    VS:  BP (!) 128/58   Pulse (!) 52  Ht 5' 10.6" (1.793 m)   Wt 187 lb 1.3 oz (84.9 kg)   SpO2 98%   BMI 26.39 kg/m     Wt Readings from Last 3 Encounters:  10/07/21 187 lb 1.3 oz (84.9 kg)  09/06/21 187 lb (84.8 kg)  08/14/21 187 lb (84.8 kg)     GEN: Patient is in no acute distress HEENT: Normal NECK: No JVD; No carotid bruits LYMPHATICS: No lymphadenopathy CARDIAC: Hear sounds regular, 2/6 systolic murmur at the apex. RESPIRATORY:  Clear to auscultation without rales, wheezing or rhonchi  ABDOMEN: Soft, non-tender, non-distended MUSCULOSKELETAL:  No edema; No deformity  SKIN: Warm and dry NEUROLOGIC:  Alert and oriented x 3 PSYCHIATRIC:  Normal affect   Signed, Jenean Lindau, MD  10/07/2021 11:05 AM    [place]

## 2021-10-22 DIAGNOSIS — L821 Other seborrheic keratosis: Secondary | ICD-10-CM | POA: Diagnosis not present

## 2021-10-22 DIAGNOSIS — D485 Neoplasm of uncertain behavior of skin: Secondary | ICD-10-CM | POA: Diagnosis not present

## 2021-10-22 DIAGNOSIS — D3617 Benign neoplasm of peripheral nerves and autonomic nervous system of trunk, unspecified: Secondary | ICD-10-CM | POA: Diagnosis not present

## 2021-10-22 DIAGNOSIS — D0439 Carcinoma in situ of skin of other parts of face: Secondary | ICD-10-CM | POA: Diagnosis not present

## 2021-10-24 ENCOUNTER — Telehealth: Payer: Self-pay | Admitting: Orthopedic Surgery

## 2021-10-24 NOTE — Telephone Encounter (Signed)
This is my 2nd attempt at trying to reach Mr. Yeakle to schedule surgery.  Message left for him to please return my call to discuss possible dates.

## 2021-10-30 DIAGNOSIS — C4492 Squamous cell carcinoma of skin, unspecified: Secondary | ICD-10-CM | POA: Insufficient documentation

## 2021-11-04 ENCOUNTER — Ambulatory Visit (INDEPENDENT_AMBULATORY_CARE_PROVIDER_SITE_OTHER): Payer: Medicare Other | Admitting: Internal Medicine

## 2021-11-04 ENCOUNTER — Encounter: Payer: Self-pay | Admitting: Internal Medicine

## 2021-11-04 VITALS — BP 136/82 | HR 50 | Temp 98.1°F | Resp 18 | Ht 71.0 in | Wt 188.2 lb

## 2021-11-04 DIAGNOSIS — Z0000 Encounter for general adult medical examination without abnormal findings: Secondary | ICD-10-CM | POA: Diagnosis not present

## 2021-11-04 DIAGNOSIS — I10 Essential (primary) hypertension: Secondary | ICD-10-CM

## 2021-11-04 DIAGNOSIS — E782 Mixed hyperlipidemia: Secondary | ICD-10-CM | POA: Diagnosis not present

## 2021-11-04 DIAGNOSIS — Z79899 Other long term (current) drug therapy: Secondary | ICD-10-CM

## 2021-11-04 DIAGNOSIS — F419 Anxiety disorder, unspecified: Secondary | ICD-10-CM

## 2021-11-04 LAB — LIPID PANEL
Cholesterol: 141 mg/dL (ref 0–200)
HDL: 52.7 mg/dL (ref >39.00)
LDL Cholesterol: 76 mg/dL (ref 0–99)
NonHDL: 87.82
Total CHOL/HDL Ratio: 3
Triglycerides: 60 mg/dL (ref 0.0–149.0)
VLDL: 12 mg/dL (ref 0.0–40.0)

## 2021-11-04 LAB — AST: AST: 15 U/L (ref 0–37)

## 2021-11-04 LAB — ALT: ALT: 10 U/L (ref 0–53)

## 2021-11-04 LAB — TSH: TSH: 1.6 u[IU]/mL (ref 0.35–5.50)

## 2021-11-04 NOTE — Patient Instructions (Signed)
Check the  blood pressure regularly.  BP GOAL is between 110/65 and  135/85. If it is consistently higher or lower, let me know    GO TO THE LAB : Get the blood work     GO TO THE FRONT DESK, PLEASE SCHEDULE YOUR APPOINTMENTS Come back for a checkup in 6 months 

## 2021-11-04 NOTE — Progress Notes (Signed)
Subjective:    Patient ID: Robert Krause, male    DOB: 08/25/1934, 87 y.o.   MRN: [id number]  DOS:  11/04/2021 Type of visit - description: cpx  Here for CPX. Has no major concerns. Has chronic LUTS symptoms at baseline. Normal aches and pains.  Review of Systems  Other than above, a 14 point review of systems is negative      Past Medical History:  Diagnosis Date   Abdominal pain, right upper quadrant 04/24/2016   ACTINIC KERATOSIS, CHEEK, LEFT 08/28/2009   Qualifier: Diagnosis of  By: Arnoldo Morale MD, John E    Adenomatous polyp of colon 10/2005   ALLERGIC RHINITIS 09/28/2007   Qualifier: Diagnosis of  By: Arnoldo Morale MD, Balinda Quails    Annual physical exam >>>>>>>>>>>>>>>>>>> 07/18/2014   Anxiety    Anxiety and depression 05/29/2019   BPH (benign prostatic hyperplasia)    Carpal tunnel syndrome, left upper limb 01/01/2021   Carpal tunnel syndrome, right upper limb 01/01/2021   Chronic interstitial cystitis    Dr Amalia Hailey   Chronic nausea 11/12/2020   Colon polyp 04/14/2011   Contracture of palmar fascia 10/16/2010   Qualifier: Diagnosis of  By: Arnoldo Morale MD, Balinda Quails    CTS (carpal tunnel syndrome) 01/27/2017   Essential hypertension 09/28/2007   Qualifier: Diagnosis of  By: Arnoldo Morale MD, Balinda Quails    FINGER SUP FB W/O MAJ OPEN WOUND&W/O MENTION INF 02/11/2008   Qualifier: Diagnosis of  By: Arnoldo Morale MD, Balinda Quails    Glaucoma    Hyperlipidemia    Hypertension    Osteoarthritis    no per pt   OSTEOARTHRITIS 09/28/2007   Qualifier: Diagnosis of  By: Arnoldo Morale MD, Balinda Quails    Other chronic sinusitis 09/28/2007   Qualifier: Diagnosis of  By: Arnoldo Morale MD, Balinda Quails    PCP NOTES >>>>>> 11/08/2015   PROSTATITIS, ACUTE, CHRONIC 03/26/2009   Qualifier: Diagnosis of  By: Arnoldo Morale MD, Balinda Quails    SCC (squamous cell carcinoma)    Venous lake    right inferior clavicle, shave    Past Surgical History:  Procedure Laterality Date   APPENDECTOMY     CATARACT EXTRACTION Right 06-2015   COLONOSCOPY      POLYPECTOMY     TRANSURETHRAL RESECTION OF PROSTATE     UPPER GASTROINTESTINAL ENDOSCOPY     Social History   Socioeconomic History   Marital status: Married    Spouse name: Not on file   Number of children: 1   Years of education: Not on file   Highest education level: Not on file  Occupational History   Occupation: retired- [employer]    Employer: RETIRED  Tobacco Use   Smoking status: Never   Smokeless tobacco: Never  Vaping Use   Vaping Use: Never used  Substance and Sexual Activity   Alcohol use: Yes    Alcohol/week: 3.0 standard drinks    Types: 3 Glasses of wine per week    Comment: wine   Drug use: No   Sexual activity: Not on file  Other Topics Concern   Not on file  Social History Narrative   Born in [place], raised in [place], moved to [place] in the 60s   Household: pt and wife   Son lives in the [place]    Social Determinants of Health   Financial Resource Strain: Not on file  Food Insecurity: Not on file  Transportation Needs: Not on file  Physical Activity: Not on file  Stress: Not on  file  Social Connections: Not on file  Intimate Partner Violence: Not on file    Allergies as of 11/04/2021   No Known Allergies      Medication List        Accurate as of November 04, 2021 11:59 PM. If you have any questions, ask your nurse or doctor.          STOP taking these medications    baclofen 10 MG tablet Commonly known as: LIORESAL Stopped by: Kathlene November, MD       TAKE these medications    ALPRAZolam 0.25 MG tablet Commonly known as: XANAX Take 0.25 mg by mouth as needed for anxiety.   gabapentin 100 MG capsule Commonly known as: NEURONTIN Take 1 capsule (100 mg total) by mouth at bedtime.   irbesartan-hydrochlorothiazide 150-12.5 MG tablet Commonly known as: AVALIDE Take 1 tablet by mouth daily.   latanoprost 0.005 % ophthalmic solution Commonly known as: XALATAN Place 1 drop into both eyes at bedtime.   pravastatin 10 MG tablet Commonly  known as: PRAVACHOL Take 1 tablet (10 mg total) by mouth daily.   tamsulosin 0.4 MG Caps capsule Commonly known as: FLOMAX Take 1 capsule by mouth Daily.           Objective:   Physical Exam BP 136/82 (BP Location: Left Arm, Patient Position: Sitting, Cuff Size: Small)   Pulse (!) 50   Temp 98.1 F (36.7 C) (Oral)   Resp 18   Ht 5\' 11"  (1.803 m)   Wt 188 lb 4 oz (85.4 kg)   SpO2 97%   BMI 26.26 kg/m  General: Well developed, NAD, BMI noted Neck: No  thyromegaly  HEENT:  Normocephalic . Face symmetric, atraumatic Lungs:  CTA B Normal respiratory effort, no intercostal retractions, no accessory muscle use. Heart: RRR,  no murmur.  Abdomen:  Not distended, soft, non-tender. No rebound or rigidity.   Lower extremities: no pretibial edema bilaterally  Skin: Exposed areas without rash. Not pale. Not jaundice Neurologic:  alert & oriented X3.  Speech normal, gait appropriate for age and unassisted Strength symmetric and appropriate for age.  Psych: Cognition and judgment appear intact.  Cooperative with normal attention span and concentration.  Behavior appropriate. No anxious or depressed appearing.     Assessment      Assessment Prediabetes HTN CV: --Bradycardia: Holter 2016 essentially negative --06/07/2019 dx was  DOE and palpitations: ECHO unremarkable, nuclear stress test EF 45%, no ischemic changes Hyperlipidemia Anxiety: on xanax, stress related to interstitial cystitis, lexapro intol (-2020) Insomnia: On melatonin, not interested in other medications DJD CTS: B, offered surgery before  Obesity: BMI 32 Glaucoma GU: Dr. Amalia Hailey --BPH, TURP --Interstitial cystitis (on gabapentin - xanax prn) Venous lake, right inferior clavicle Chronic nausea: GB U/S 2017 (-),  saw GI, EGD 11-2018: Erosive gastritis, BX with no H. pylori, + reactive gastropathy.   CT abdomen 08-2019: Benign.   PLAN Here for CPX HTN: Seems well controlled, last BMP satisfactory,  continue present care. High cholesterol: On Pravachol, checking labs. Anxiety: Very rarely uses Xanax, contract signed. Other  problems seem stable RTC 6 months   This visit occurred during the SARS-CoV-2 public health emergency.  Safety protocols were in place, including screening questions prior to the visit, additional usage of staff PPE, and extensive cleaning of exam room while observing appropriate contact time as indicated for disinfecting solutions.

## 2021-11-05 ENCOUNTER — Encounter: Payer: Self-pay | Admitting: Internal Medicine

## 2021-11-05 NOTE — Assessment & Plan Note (Signed)
Here for CPX HTN: Seems well controlled, last BMP satisfactory, continue present care. High cholesterol: On Pravachol, checking labs. Anxiety: Very rarely uses Xanax, contract signed. Other  problems seem stable RTC 6 months

## 2021-11-05 NOTE — Assessment & Plan Note (Signed)
-  Td :2017  - pnm 23: 2004,  booster 2016; prevanr  2015 - zostavax: 2014 per pt - s/p  shingrix (x2) -COVID vaccine x5 (UTD)  - had a flu shot  - CCS   cscope 2012, Cscope 12/2017, next per GI  -Prostate ca screening-- per urology -Diet exercise: Counseled  -Labs reviewed, will get FLP, AST, ALT, TSH. - ACP: Reports he has a living will, recommend to bring a copy.

## 2021-11-06 LAB — DRUG MONITORING PANEL 375977 , URINE
Alcohol Metabolites: POSITIVE ng/mL — AB (ref <500)
Amphetamines: NEGATIVE ng/mL (ref <500)
Barbiturates: NEGATIVE ng/mL (ref <300)
Benzodiazepines: NEGATIVE ng/mL (ref <100)
Cocaine Metabolite: NEGATIVE ng/mL (ref <150)
Desmethyltramadol: NEGATIVE ng/mL (ref <100)
Ethyl Glucuronide (ETG): 1260 ng/mL — ABNORMAL HIGH (ref <500)
Ethyl Sulfate (ETS): 469 ng/mL — ABNORMAL HIGH (ref <100)
Marijuana Metabolite: NEGATIVE ng/mL (ref <20)
Opiates: NEGATIVE ng/mL (ref <100)
Oxycodone: NEGATIVE ng/mL (ref <100)
Tramadol: NEGATIVE ng/mL (ref <100)

## 2021-11-06 LAB — DM TEMPLATE

## 2021-11-27 DIAGNOSIS — M6748 Ganglion, other site: Secondary | ICD-10-CM | POA: Diagnosis not present

## 2021-11-27 DIAGNOSIS — M71342 Other bursal cyst, left hand: Secondary | ICD-10-CM | POA: Diagnosis not present

## 2021-12-16 DIAGNOSIS — H401132 Primary open-angle glaucoma, bilateral, moderate stage: Secondary | ICD-10-CM | POA: Diagnosis not present

## 2022-01-03 ENCOUNTER — Telehealth: Payer: Self-pay | Admitting: Internal Medicine

## 2022-01-03 NOTE — Telephone Encounter (Signed)
Left message for patient to call back and schedule Medicare Annual Wellness Visit (AWV) either virtually or in office.   Last AWV 07/08/16  please schedule at anytime with health coach

## 2022-01-26 ENCOUNTER — Other Ambulatory Visit: Payer: Self-pay | Admitting: Internal Medicine

## 2022-01-27 ENCOUNTER — Emergency Department (HOSPITAL_BASED_OUTPATIENT_CLINIC_OR_DEPARTMENT_OTHER): Payer: Medicare Other

## 2022-01-27 ENCOUNTER — Emergency Department (HOSPITAL_BASED_OUTPATIENT_CLINIC_OR_DEPARTMENT_OTHER)
Admission: EM | Admit: 2022-01-27 | Discharge: 2022-01-28 | Disposition: A | Discharge status: Home / Self Care | Payer: Medicare Other | Attending: Emergency Medicine | Admitting: Emergency Medicine

## 2022-01-27 ENCOUNTER — Encounter (HOSPITAL_BASED_OUTPATIENT_CLINIC_OR_DEPARTMENT_OTHER): Payer: Self-pay | Admitting: *Deleted

## 2022-01-27 ENCOUNTER — Other Ambulatory Visit: Payer: Self-pay

## 2022-01-27 DIAGNOSIS — R6889 Other general symptoms and signs: Secondary | ICD-10-CM | POA: Diagnosis not present

## 2022-01-27 DIAGNOSIS — I10 Essential (primary) hypertension: Secondary | ICD-10-CM | POA: Diagnosis not present

## 2022-01-27 DIAGNOSIS — R110 Nausea: Secondary | ICD-10-CM | POA: Diagnosis not present

## 2022-01-27 DIAGNOSIS — Z743 Need for continuous supervision: Secondary | ICD-10-CM | POA: Diagnosis not present

## 2022-01-27 DIAGNOSIS — R42 Dizziness and giddiness: Secondary | ICD-10-CM | POA: Diagnosis not present

## 2022-01-27 DIAGNOSIS — R1111 Vomiting without nausea: Secondary | ICD-10-CM | POA: Diagnosis not present

## 2022-01-27 DIAGNOSIS — R112 Nausea with vomiting, unspecified: Secondary | ICD-10-CM | POA: Diagnosis not present

## 2022-01-27 LAB — URINALYSIS, ROUTINE W REFLEX MICROSCOPIC
Bilirubin Urine: NEGATIVE
Glucose, UA: NEGATIVE mg/dL
Hgb urine dipstick: NEGATIVE
Ketones, ur: 40 mg/dL — AB
Leukocytes,Ua: NEGATIVE
Nitrite: NEGATIVE
Protein, ur: 30 mg/dL — AB
Specific Gravity, Urine: 1.028 (ref 1.005–1.030)
pH: 7 (ref 5.0–8.0)

## 2022-01-27 LAB — CBC
HCT: 43.6 % (ref 39.0–52.0)
Hemoglobin: 15.2 g/dL (ref 13.0–17.0)
MCH: 31.8 pg (ref 26.0–34.0)
MCHC: 34.9 g/dL (ref 30.0–36.0)
MCV: 91.2 fL (ref 80.0–100.0)
Platelets: 202 10*3/uL (ref 150–400)
RBC: 4.78 MIL/uL (ref 4.22–5.81)
RDW: 12.4 % (ref 11.5–15.5)
WBC: 5.7 10*3/uL (ref 4.0–10.5)
nRBC: 0 % (ref 0.0–0.2)

## 2022-01-27 LAB — BASIC METABOLIC PANEL
Anion gap: 10 (ref 5–15)
BUN: 23 mg/dL (ref 8–23)
CO2: 25 mmol/L (ref 22–32)
Calcium: 9.4 mg/dL (ref 8.9–10.3)
Chloride: 100 mmol/L (ref 98–111)
Creatinine, Ser: 1.07 mg/dL (ref 0.61–1.24)
GFR, Estimated: >60 mL/min (ref >60)
Glucose, Bld: 125 mg/dL — ABNORMAL HIGH (ref 70–99)
Potassium: 3.9 mmol/L (ref 3.5–5.1)
Sodium: 135 mmol/L (ref 135–145)

## 2022-01-27 LAB — CBG MONITORING, ED: Glucose-Capillary: 123 mg/dL — ABNORMAL HIGH (ref 70–99)

## 2022-01-27 NOTE — ED Triage Notes (Signed)
Pt woke up this am feeling like the room is spinning and he was having nausea with this. Pt has vomited about 6-7 times today. Movement makes the spinning worse.  Pt and wife deny any neuro deficit, weakness with this.  Pt has no facial droop or changes in speech ?

## 2022-01-28 DIAGNOSIS — H6123 Impacted cerumen, bilateral: Secondary | ICD-10-CM | POA: Diagnosis not present

## 2022-01-28 MED ORDER — MECLIZINE HCL 25 MG PO TABS
25.0000 mg | ORAL_TABLET | Freq: Once | ORAL | Status: AC
  Administered 2022-01-28: 25 mg via ORAL
  Filled 2022-01-28: qty 1

## 2022-01-28 MED ORDER — MECLIZINE HCL 25 MG PO TABS: 25.0000 mg | ORAL_TABLET | Freq: Three times a day (TID) | ORAL | 0 refills | Status: DC | PRN

## 2022-01-28 NOTE — Discharge Instructions (Addendum)
Begin taking meclizine as prescribed. ? ?Rest. ? ?Follow-up with your primary doctor/ENT as needed if symptoms do not improve, and return to the ER if symptoms significantly worsen or change. ?

## 2022-01-28 NOTE — ED Provider Notes (Signed)
[facility] Provider Note   CSN: [id number] Arrival date & time: 01/27/22  1910     History  Chief Complaint  Patient presents with   Dizziness    Robert Krause is a 88 y.o. male.  Patient is an 88 year old male with past medical history of hypertension, hyperlipidemia, osteoarthritis.  He presents today for evaluation of dizziness.  Patient woke this morning, then developed an episode of room spinning.  This lasted for approximately 10 minutes and was relieved with remaining still.  Patient reports running errands to the [employer] and felt well.  When he walked to the parking lot after shopping, he reports a recurrence of the dizzy sensation.  He felt as though the parking lot was spinning around and around.  This resolved, then recurred again this evening and patient was transported here by EMS.  Symptoms have since resolved and he feels fine.  The history is provided by the patient.  Dizziness Quality:  Room spinning Severity:  Moderate Onset quality:  Sudden Duration:  12 hours Timing:  Intermittent Progression:  Resolved Chronicity:  New     Home Medications Prior to Admission medications   Medication Sig Start Date End Date Taking? Authorizing Provider  ALPRAZolam Duanne Moron) 0.25 MG tablet Take 0.25 mg by mouth as needed for anxiety. 12/12/20   [provider]  gabapentin (NEURONTIN) 100 MG capsule Take 1 capsule (100 mg total) by mouth at bedtime. 05/29/21   Jessy Oto, MD  irbesartan-hydrochlorothiazide (AVALIDE) 150-12.5 MG tablet Take 1 tablet by mouth daily. 12/21/20   Colon Branch, MD  latanoprost (XALATAN) 0.005 % ophthalmic solution Place 1 drop into both eyes at bedtime.    [provider]  pravastatin (PRAVACHOL) 10 MG tablet TAKE 1 TABLET(10 MG) BY MOUTH DAILY 01/27/22   Colon Branch, MD  Tamsulosin HCl (FLOMAX) 0.4 MG CAPS Take 1 capsule by mouth Daily. 03/06/11   [provider]      Allergies    Patient  has no known allergies.    Review of Systems   Review of Systems  Neurological:  Positive for dizziness.  All other systems reviewed and are negative.  Physical Exam Updated Vital Signs BP (!) 155/89 (BP Location: Left Arm)    Pulse 75    Temp 97.8 F (36.6 C) (Oral)    Resp 16    Ht '5\' 11"'$  (1.803 m)    Wt 82.6 kg    SpO2 100%    BMI 25.38 kg/m  Physical Exam Vitals and nursing note reviewed.  Constitutional:      General: He is not in acute distress.    Appearance: He is well-developed. He is not diaphoretic.  HENT:     Head: Normocephalic and atraumatic.  Eyes:     Extraocular Movements: Extraocular movements intact.     Pupils: Pupils are equal, round, and reactive to light.     Comments: There is no nystagmus upon my exam.  Cardiovascular:     Rate and Rhythm: Normal rate and regular rhythm.     Heart sounds: No murmur heard.   No friction rub.  Pulmonary:     Effort: Pulmonary effort is normal. No respiratory distress.     Breath sounds: Normal breath sounds. No wheezing or rales.  Abdominal:     General: Bowel sounds are normal. There is no distension.     Palpations: Abdomen is soft.     Tenderness: There is no abdominal tenderness.  Musculoskeletal:        General: Normal range of motion.     Cervical back: Normal range of motion and neck supple.  Skin:    General: Skin is warm and dry.  Neurological:     General: No focal deficit present.     Mental Status: He is alert and oriented to person, place, and time.     Cranial Nerves: No cranial nerve deficit.     Motor: No weakness.     Coordination: Coordination normal.    ED Results / Procedures / Treatments   Labs (all labs ordered are listed, but only abnormal results are displayed) Labs Reviewed  BASIC METABOLIC PANEL - Abnormal; Notable for the following components:      Result Value   Glucose, Bld 125 (*)    All other components within normal limits  URINALYSIS, ROUTINE W REFLEX MICROSCOPIC -  Abnormal; Notable for the following components:   Ketones, ur 40 (*)    Protein, ur 30 (*)    All other components within normal limits  CBG MONITORING, ED - Abnormal; Notable for the following components:   Glucose-Capillary 123 (*)    All other components within normal limits  CBC    EKG EKG Interpretation  Date/Time:  Monday January 27 2022 20:00:12 EST Ventricular Rate:  62 PR Interval:  208 QRS Duration: 92 QT Interval:  378 QTC Calculation: 383 R Axis:   -17 Text Interpretation: Normal sinus rhythm Normal ECG When compared with ECG of 15-Jun-2019 09:03, No significant change was found Confirmed by Regan Lemming ([id number]) on 01/28/2022 12:10:28 AM  Radiology CT Head Wo Contrast  Result Date: 01/27/2022 CLINICAL DATA:  Dizziness with nausea and vomiting. EXAM: CT HEAD WITHOUT CONTRAST TECHNIQUE: Contiguous axial images were obtained from the base of the skull through the vertex without intravenous contrast. RADIATION DOSE REDUCTION: This exam was performed according to the departmental dose-optimization program which includes automated exposure control, adjustment of the mA and/or kV according to patient size and/or use of iterative reconstruction technique. COMPARISON:  July 31, 2020 FINDINGS: Brain: There is mild cerebral atrophy with widening of the extra-axial spaces and ventricular dilatation. There are areas of decreased attenuation within the white matter tracts of the supratentorial brain, consistent with microvascular disease changes. Vascular: No hyperdense vessel or unexpected calcification. Skull: Normal. Negative for fracture or focal lesion. Sinuses/Orbits: No acute finding. Other: None. IMPRESSION: 1. Generalized cerebral atrophy. 2. No acute intracranial abnormality. Electronically Signed   By: Virgina Norfolk M.D.   On: 01/27/2022 20:56    Procedures Procedures    Medications Ordered in ED Medications  meclizine (ANTIVERT) tablet 25 mg (has no administration in  time range)    ED Course/ Medical Decision Making/ A&P  This patient presents to the ED for concern of dizziness, this involves an extensive number of treatment options, and is a complaint that carries with it a high risk of complications and morbidity.  The differential diagnosis includes peripheral vertigo, posterior circulation CVA, anemia, electrolyte disturbance, hypoglycemia   Co morbidities that complicate the patient evaluation  None   Additional history obtained:  No additional history or external records needed   Lab Tests:  I Ordered, and personally interpreted labs.  The pertinent results include: Unremarkable CBC, metabolic panel, and troponin   Imaging Studies ordered:  I ordered imaging studies including CT scan of the head I independently visualized and interpreted imaging which showed no acute process I agree with the radiologist interpretation  Cardiac Monitoring:  The patient was maintained on a cardiac monitor.  I personally viewed and interpreted the cardiac monitored which showed an underlying rhythm of: Sinus   Medicines ordered and prescription drug management:  I ordered medication including meclizine for vertigo Reevaluation of the patient after these medicines showed that the patient resolved I have reviewed the patients home medicines and have made adjustments as needed   Test Considered:  No other test considered   Critical Interventions:  None   Consultations Obtained:  No consultations indicated   Problem List / ED Course:  Patient presenting here with intermittent dizziness throughout the day that he describes as a spinning sensation.  He is symptom-free at present and neurologic exam is nonfocal.  Patient's work-up shows normal head CT and unremarkable EKG and laboratory studies. Patient's presentation and description of symptoms most consistent with peripheral vertigo.  I highly doubt posterior circulation CVA due to the  intermittent nature of the symptoms. At this point, feels the patient can safely be discharged.  I will prescribe meclizine.  He does have an upcoming appointment with ENT for an unrelated issue and I have advised him to mention this to them during this appointment. Patient received meclizine here in the ER and will be prescribed meclizine for home use.    Social Determinants of Health:  None     Final Clinical Impression(s) / ED Diagnoses Final diagnoses:  None    Rx / DC Orders ED Discharge Orders     None         Veryl Speak, MD 01/28/22 [phone]

## 2022-01-28 NOTE — ED Notes (Signed)
Pt given ginger ale and kind bar after he passed swallow screen.  Pt reports that vertigo has subsided.  ?

## 2022-02-15 ENCOUNTER — Other Ambulatory Visit: Payer: Self-pay | Admitting: Internal Medicine

## 2022-03-05 ENCOUNTER — Ambulatory Visit (INDEPENDENT_AMBULATORY_CARE_PROVIDER_SITE_OTHER): Payer: Medicare Other | Admitting: Internal Medicine

## 2022-03-05 ENCOUNTER — Encounter: Payer: Self-pay | Admitting: Internal Medicine

## 2022-03-05 VITALS — BP 140/68 | HR 58 | Temp 98.0°F | Resp 18 | Ht 71.0 in | Wt 186.0 lb

## 2022-03-05 DIAGNOSIS — M79661 Pain in right lower leg: Secondary | ICD-10-CM

## 2022-03-05 NOTE — Progress Notes (Signed)
? ?Subjective:  ? ? Patient ID: Robert Krause, male    DOB: Nov 06, 1934, 88 y.o.   MRN: [id number] ? ?DOS:  03/05/2022 ?Type of visit - description: acute ? ?Symptoms started 1 or 2 weeks ago. ?Main concern is pain at the right calf, he is concerned about a clot. ?The pain is worse at night. ?No claudication per se ?No swelling ?Denies chest pain no difficulty breathing.  No palpitation. ?Has low back pain but that is a chronic issue. ?No recent airplane or car trip. ?No knee pain per se ? ?Review of Systems ?See above  ? ?Past Medical History:  ?Diagnosis Date  ? Abdominal pain, right upper quadrant 04/24/2016  ? ACTINIC KERATOSIS, CHEEK, LEFT 08/28/2009  ? Qualifier: Diagnosis of  By: Arnoldo Morale MD, Balinda Quails   ? Adenomatous polyp of colon 10/2005  ? ALLERGIC RHINITIS 09/28/2007  ? Qualifier: Diagnosis of  By: Arnoldo Morale MD, Balinda Quails   ? Annual physical exam >>>>>>>>>>>>>>>>>>> 07/18/2014  ? Anxiety   ? Anxiety and depression 05/29/2019  ? BPH (benign prostatic hyperplasia)   ? Carpal tunnel syndrome, left upper limb 01/01/2021  ? Carpal tunnel syndrome, right upper limb 01/01/2021  ? Chronic interstitial cystitis   ? Dr Amalia Hailey  ? Chronic nausea 11/12/2020  ? Colon polyp 04/14/2011  ? Contracture of palmar fascia 10/16/2010  ? Qualifier: Diagnosis of  By: Arnoldo Morale MD, Balinda Quails   ? CTS (carpal tunnel syndrome) 01/27/2017  ? Essential hypertension 09/28/2007  ? Qualifier: Diagnosis of  By: Arnoldo Morale MD, John E   ? FINGER SUP FB W/O MAJ OPEN WOUND&W/O MENTION INF 02/11/2008  ? Qualifier: Diagnosis of  By: Arnoldo Morale MD, Balinda Quails   ? Glaucoma   ? Hyperlipidemia   ? Hypertension   ? Osteoarthritis   ? no per pt  ? OSTEOARTHRITIS 09/28/2007  ? Qualifier: Diagnosis of  By: Arnoldo Morale MD, Balinda Quails   ? Other chronic sinusitis 09/28/2007  ? Qualifier: Diagnosis of  By: Arnoldo Morale MD, Balinda Quails   ? PCP NOTES >>>>>> 11/08/2015  ? PROSTATITIS, ACUTE, CHRONIC 03/26/2009  ? Qualifier: Diagnosis of  By: Arnoldo Morale MD, Balinda Quails   ? SCC (squamous cell carcinoma)   ?  Venous [place]   ? right inferior clavicle, shave  ? ? ?Past Surgical History:  ?Procedure Laterality Date  ? APPENDECTOMY    ? CATARACT EXTRACTION Right 06-2015  ? COLONOSCOPY    ? POLYPECTOMY    ? TRANSURETHRAL RESECTION OF PROSTATE    ? UPPER GASTROINTESTINAL ENDOSCOPY    ? ? ?Current Outpatient Medications  ?Medication Instructions  ? ALPRAZolam (XANAX) 0.25 mg, As needed  ? gabapentin (NEURONTIN) 100 mg, Oral, Daily at bedtime  ? irbesartan-hydrochlorothiazide (AVALIDE) 150-12.5 MG tablet 1 tablet, Oral, Daily  ? latanoprost (XALATAN) 0.005 % ophthalmic solution 1 drop, Both Eyes, Daily at bedtime  ? meclizine (ANTIVERT) 25 mg, Oral, 3 times daily PRN  ? pravastatin (PRAVACHOL) 10 MG tablet TAKE 1 TABLET(10 MG) BY MOUTH DAILY  ? Tamsulosin HCl (FLOMAX) 0.4 MG CAPS 1 capsule, Daily  ? ? ?   ?Objective:  ? Physical Exam ?BP 140/68 (BP Location: Left Arm, Patient Position: Sitting, Cuff Size: Small)   Pulse (!) 58   Temp 98 ?F (36.7 ?C) (Oral)   Resp 18   Ht '5\' 11"'$  (1.803 m)   Wt 186 lb (84.4 kg)   SpO2 97%   BMI 25.94 kg/m?  ?General:   ?Well developed, NAD, BMI noted. ?HEENT:  ?Normocephalic . Face  symmetric, atraumatic ? Lower extremities:  ?no pretibial edema bilaterally.  Pedal pulses strong. ?Calves: Soft, symmetric, no TTP. ?No evidence of cellulitis or phlebitis ?Skin: Not pale. Not jaundice ?Neurologic:  ?alert & oriented X3.  ?Speech normal, gait appropriate for age and unassisted. ?Motor symmetric, straight leg test negative ?Psych--  ?Cognition and judgment appear intact.  ?Cooperative with normal attention span and concentration.  ?Behavior appropriate. ?No anxious or depressed appearing.  ? ?   ?Assessment   ? ? Assessment ?Prediabetes ?HTN ?CV: ?--Bradycardia: Holter 2016 essentially negative ?--06/07/2019 dx was  DOE and palpitations: ECHO unremarkable, nuclear stress test EF 45%, no ischemic changes ?Hyperlipidemia ?Anxiety: on xanax, stress related to interstitial cystitis, lexapro intol  (-2020) ?Insomnia: On melatonin, not interested in other medications ?DJD ?CTS: B, offered surgery before  ?Obesity: BMI 32 ?Glaucoma ?GU: Dr. Amalia Hailey ?--BPH, TURP ?--Interstitial cystitis (on gabapentin - xanax prn) ?Venous lake, right inferior clavicle ?Chronic nausea: GB U/S 2017 (-),  saw GI, EGD 11-2018: Erosive gastritis, BX with no H. pylori, + reactive gastropathy.   CT abdomen 08-2019: Benign. ? ? ?PLAN ?Right calf pain: ?As described above, no swelling, no claudication, patient is concerned about a clot, my suspicions for DVT is very low.  Suspect this is rather MSK issue. ?We agreed on conservative treatment including Tylenol, ice or hot compresses. ?Refer to sports medicine >>>  Declined for now. ?He would like to proceed with ultrasound if is not improving, and I agree to his request. He will let me know. ? ? ? ?This visit occurred during the SARS-CoV-2 public health emergency.  Safety protocols were in place, including screening questions prior to the visit, additional usage of staff PPE, and extensive cleaning of exam room while observing appropriate contact time as indicated for disinfecting solutions.  ? ?

## 2022-03-05 NOTE — Assessment & Plan Note (Signed)
Right calf pain: ?As described above, no swelling, no claudication, patient is concerned about a clot, my suspicions for DVT is very low.  Suspect this is rather MSK issue. ?We agreed on conservative treatment including Tylenol, ice or hot compresses. ?Refer to sports medicine >>>  Declined for now. ?He would like to proceed with ultrasound if is not improving, and I agree to his request. He will let me know. ? ?

## 2022-03-17 DIAGNOSIS — H401132 Primary open-angle glaucoma, bilateral, moderate stage: Secondary | ICD-10-CM | POA: Diagnosis not present

## 2022-05-05 ENCOUNTER — Ambulatory Visit: Payer: Medicare Other | Admitting: Internal Medicine

## 2022-05-20 ENCOUNTER — Ambulatory Visit: Payer: Medicare Other | Admitting: Gastroenterology

## 2022-05-24 DIAGNOSIS — U071 COVID-19: Secondary | ICD-10-CM | POA: Insufficient documentation

## 2022-05-24 HISTORY — DX: COVID-19: U07.1

## 2022-05-29 ENCOUNTER — Encounter: Payer: Self-pay | Admitting: Internal Medicine

## 2022-05-29 ENCOUNTER — Ambulatory Visit: Payer: Medicare Other | Admitting: Cardiology

## 2022-06-15 DIAGNOSIS — U071 COVID-19: Secondary | ICD-10-CM | POA: Diagnosis not present

## 2022-06-16 IMAGING — CT CT HEAD WO/W CM
4 of 5 series · 16 of 47 positions shown, 18 images · IV contrast (omnipaque)
Comparison: None.

CLINICAL DATA: Chronic nausea.  Weight loss.

EXAM:
CT HEAD WITHOUT AND WITH CONTRAST
TECHNIQUE: Contiguous axial images were obtained from the base of the skull
through the vertex without and with intravenous contrast
CONTRAST:  80mL OMNIPAQUE IOHEXOL 300 MG/ML  SOLN

[Series 2: head wo 5.0 hc40 · axial · 0.42mm/px · z∈[-79,+26]mm · 8 of 28 slices shown, 10 images]
[im 4/28  brain]
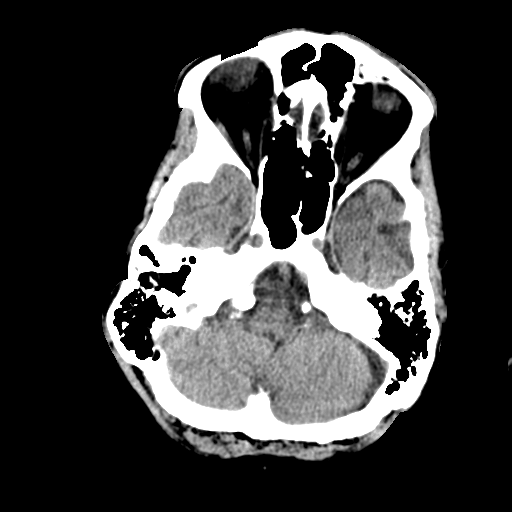
[im 4/28  bone]
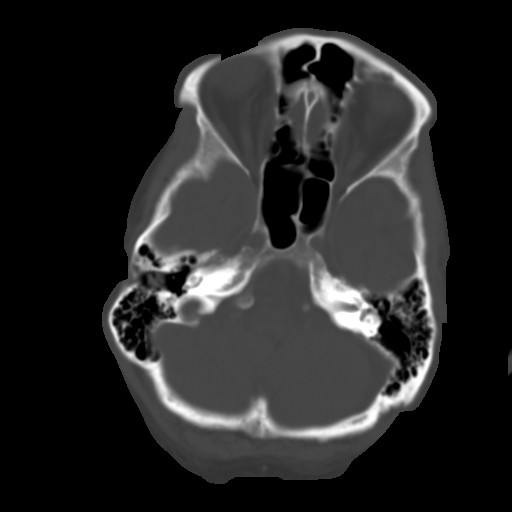
[im 7/28  brain]
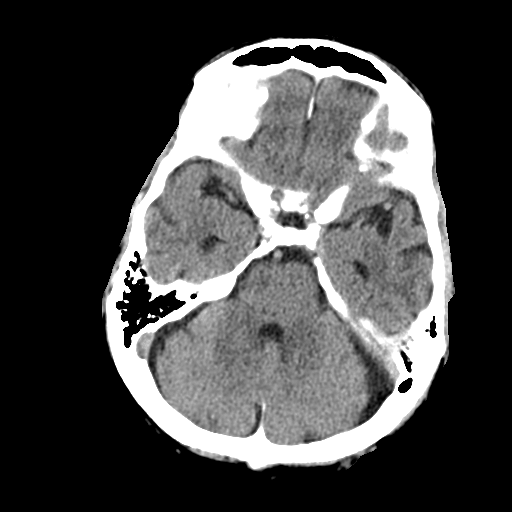
[im 10/28  brain]
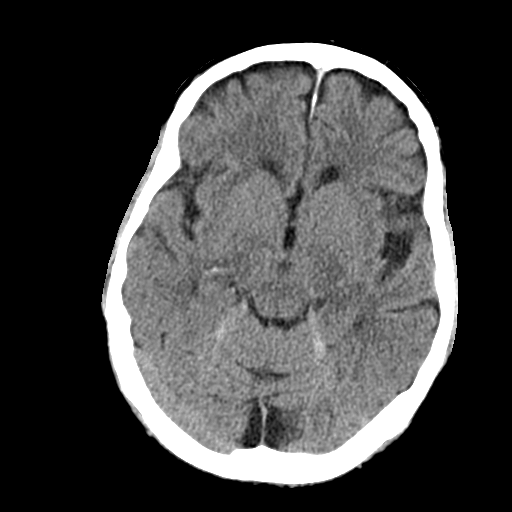
[im 13/28  brain]
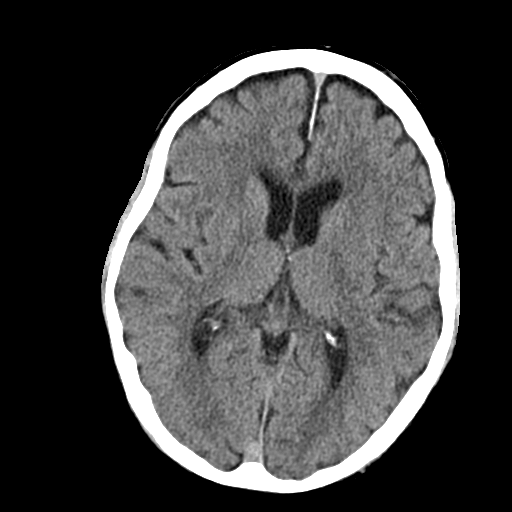
[im 16/28  brain]
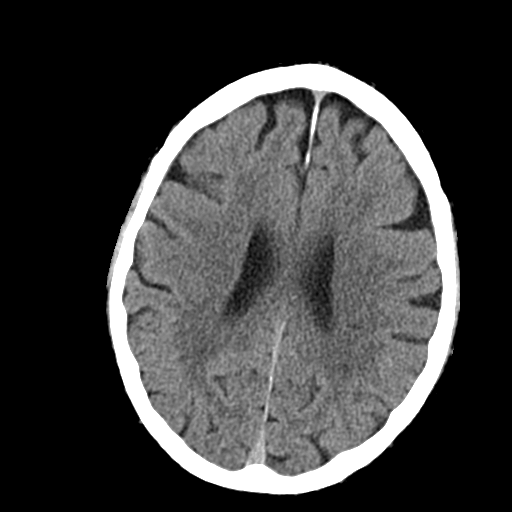
[im 16/28  bone]
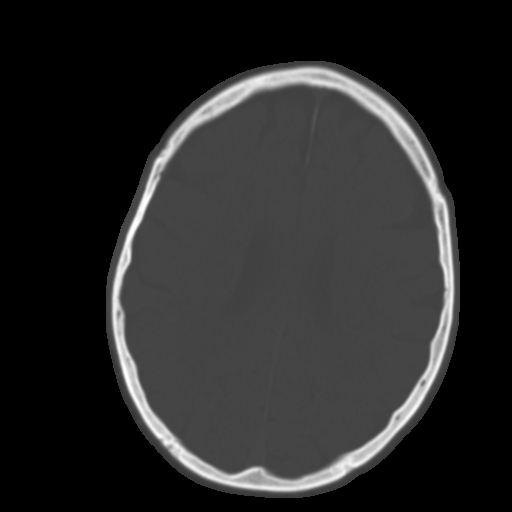
[im 19/28  brain]
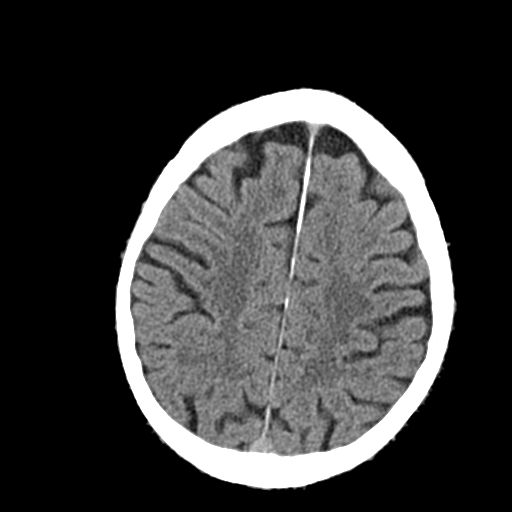
[im 22/28  brain]
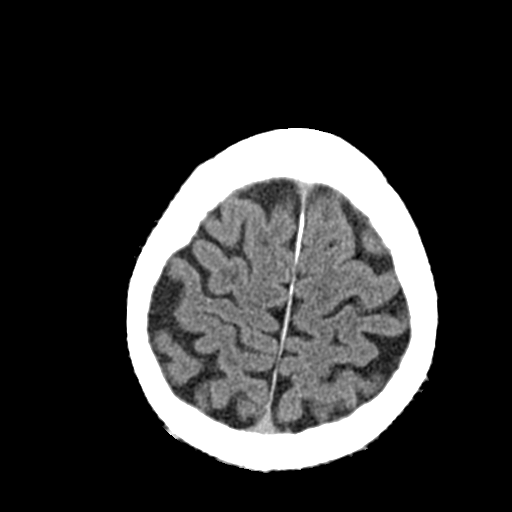
[im 25/28  brain]
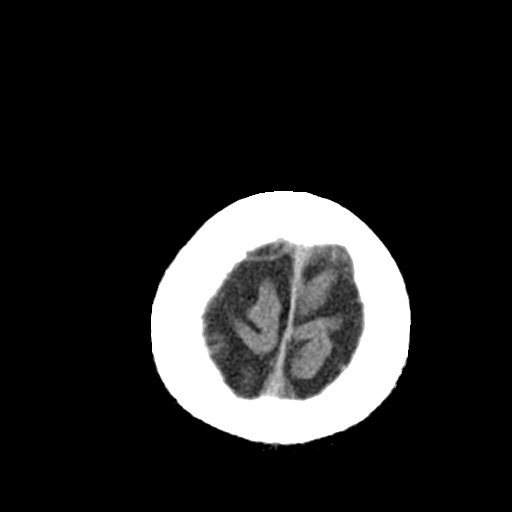

[Series 3: head wo 5.0 hr68 · axial · 0.42mm/px · z∈[-79,-64]mm · 2 of 28 slices shown]
[im 4/28  brain]
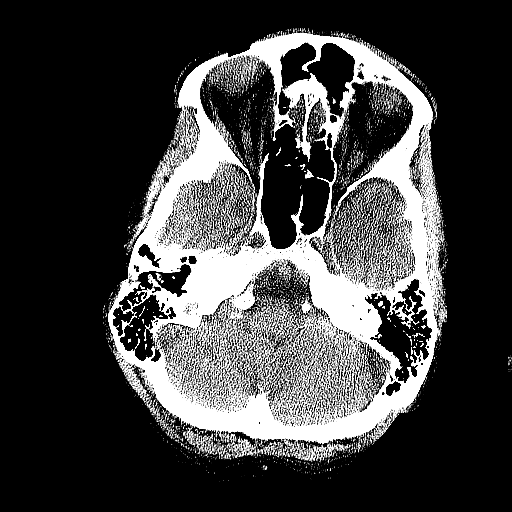
[im 7/28  brain]
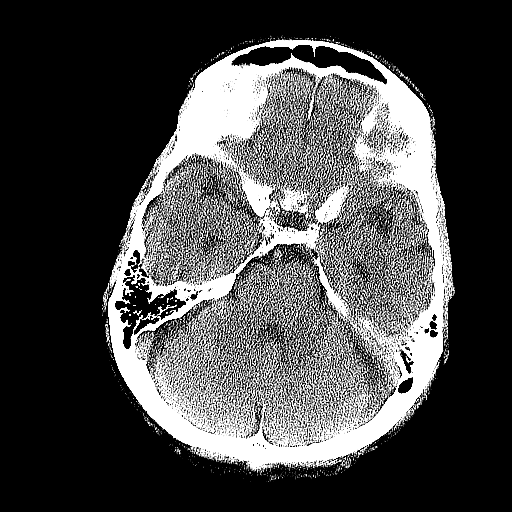

[Series 5: head with 3.0 mpr coronal · coronal · 0.29mm/px · 3 of 66 slices shown]
[im 22/66  brain]
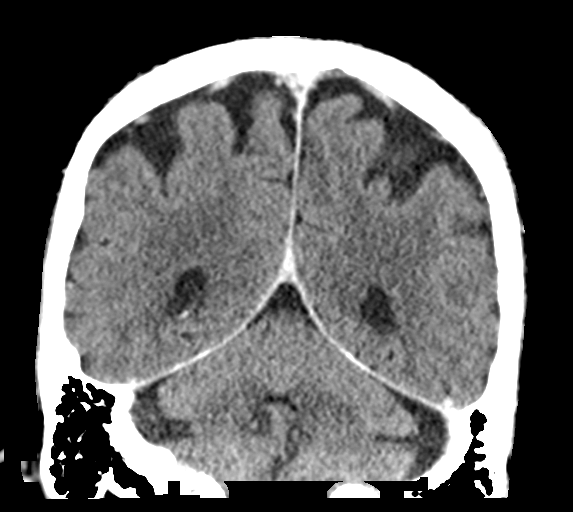
[im 29/66  brain]
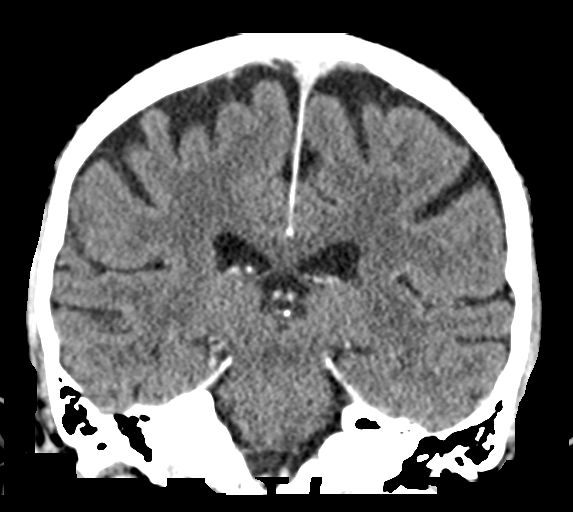
[im 37/66  brain]
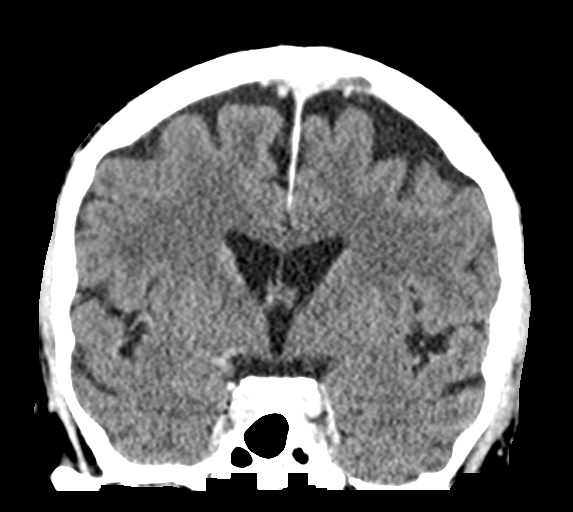

[Series 6: head with 3.0 mpr sagittal · sagittal · 0.28mm/px · 3 of 57 slices shown]
[im 19/57  brain]
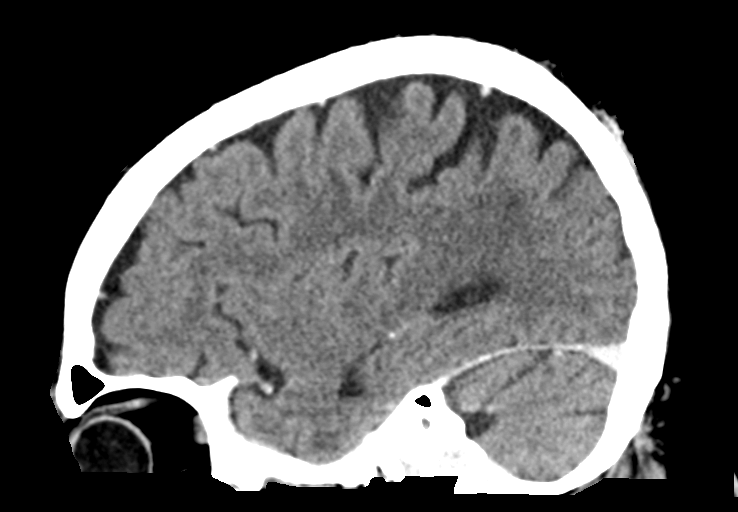
[im 29/57  brain]
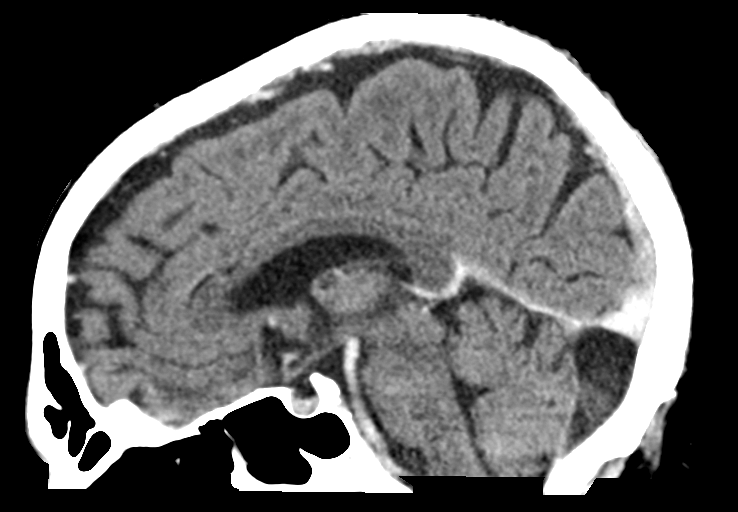
[im 38/57  brain]
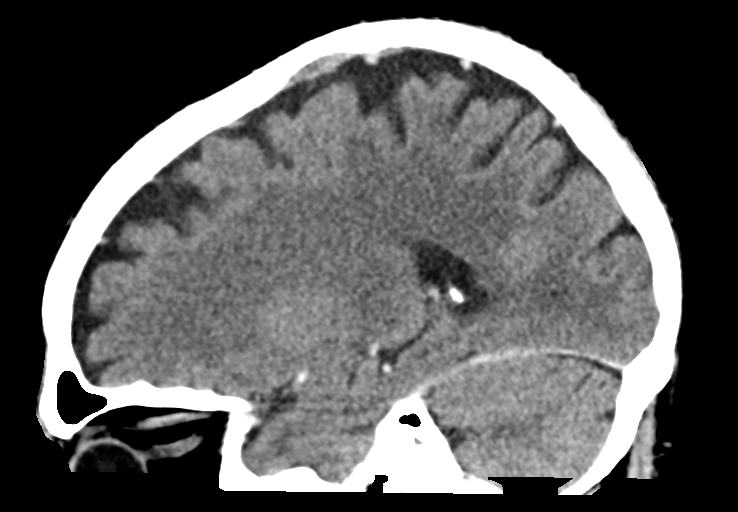

[16 of 47 positions shown; findings below may reference images not displayed]

FINDINGS: Brain: There is no evidence of an acute infarct, intracranial
hemorrhage, mass, midline shift, or extra-axial fluid collection.
Mild cerebral atrophy is within normal limits for age. Hypodensities
in the cerebral white matter bilaterally are nonspecific but
compatible with minimal chronic small vessel ischemic disease, also
not considered abnormal for age. No abnormal enhancement is
identified.

Vascular: Calcified atherosclerosis at the skull base. The major
dural venous sinuses and large arteries at the base of the brain are
grossly patent.

Skull: No fracture or suspicious osseous lesion.

Sinuses/Orbits: Mild right anterior ethmoid air cell mucosal
thickening. Clear mastoid air cells. Cerumen in both external
auditory canals. Right cataract extraction.

Other: None.
IMPRESSION: Unremarkable CT appearance of the brain for age.

## 2022-06-24 ENCOUNTER — Ambulatory Visit: Payer: Medicare Other | Admitting: Cardiology

## 2022-07-02 DIAGNOSIS — N3010 Interstitial cystitis (chronic) without hematuria: Secondary | ICD-10-CM | POA: Diagnosis not present

## 2022-07-17 ENCOUNTER — Other Ambulatory Visit: Payer: Self-pay | Admitting: Internal Medicine

## 2022-07-21 ENCOUNTER — Ambulatory Visit: Payer: Medicare Other | Admitting: Gastroenterology

## 2022-08-12 DIAGNOSIS — H25812 Combined forms of age-related cataract, left eye: Secondary | ICD-10-CM | POA: Diagnosis not present

## 2022-08-12 DIAGNOSIS — H401132 Primary open-angle glaucoma, bilateral, moderate stage: Secondary | ICD-10-CM | POA: Diagnosis not present

## 2022-08-12 DIAGNOSIS — Z961 Presence of intraocular lens: Secondary | ICD-10-CM | POA: Diagnosis not present

## 2022-08-13 ENCOUNTER — Ambulatory Visit (INDEPENDENT_AMBULATORY_CARE_PROVIDER_SITE_OTHER): Payer: Medicare Other | Admitting: Family

## 2022-08-13 ENCOUNTER — Telehealth: Payer: Self-pay | Admitting: Family

## 2022-08-13 ENCOUNTER — Ambulatory Visit (HOSPITAL_BASED_OUTPATIENT_CLINIC_OR_DEPARTMENT_OTHER)
Admission: RE | Admit: 2022-08-13 | Discharge: 2022-08-13 | Disposition: A | Discharge status: Home / Self Care | Payer: Medicare Other | Source: Ambulatory Visit | Attending: Family | Admitting: Family

## 2022-08-13 VITALS — BP 152/55 | HR 52 | Temp 97.4°F | Resp 16 | Wt 187.0 lb

## 2022-08-13 DIAGNOSIS — R0781 Pleurodynia: Secondary | ICD-10-CM | POA: Insufficient documentation

## 2022-08-13 HISTORY — DX: Pleurodynia: R07.81

## 2022-08-13 NOTE — Telephone Encounter (Signed)
Patient advised of results and provider's comments 

## 2022-08-13 NOTE — Telephone Encounter (Signed)
Please advise pt that his chest x-ray and his rib x-ray are both normal.  He must have really badly bruised that area. Recommend topical salon pas patches and tylenol as needed as we discussed at his visit.  Let us know if symptoms do not improve in 1-2 weeks.

## 2022-08-13 NOTE — Progress Notes (Signed)
Subjective:     Patient ID: Robert Krause, male    DOB: [birth year], 88 y.o.   MRN: [id number]  Chief Complaint  Patient presents with   Flank Pain    Complains of left flank pain after injury 3 days ago.    HPI Patient is in today with chief complaint of left flank pain. Pain began 3 days ago after he bumped his left lower ribs into a piece of wood while he was crawling around on his deck.  Notes that it hurts to take a deep breath.   Health Maintenance Due  Topic Date Due   COVID-19 Vaccine (6 - Pfizer risk series) 11/08/2021   INFLUENZA VACCINE  06/24/2022    Past Medical History:  Diagnosis Date   Abdominal pain, right upper quadrant 04/24/2016   ACTINIC KERATOSIS, CHEEK, LEFT 08/28/2009   Qualifier: Diagnosis of  By: Arnoldo Morale MD, John E    Adenomatous polyp of colon 10/2005   ALLERGIC RHINITIS 09/28/2007   Qualifier: Diagnosis of  By: Arnoldo Morale MD, Balinda Quails    Annual physical exam >>>>>>>>>>>>>>>>>>> 07/18/2014   Anxiety    Anxiety and depression 05/29/2019   BPH (benign prostatic hyperplasia)    Carpal tunnel syndrome, left upper limb 01/01/2021   Carpal tunnel syndrome, right upper limb 01/01/2021   Chronic interstitial cystitis    Dr Amalia Hailey   Chronic nausea 11/12/2020   Colon polyp 04/14/2011   Contracture of palmar fascia 10/16/2010   Qualifier: Diagnosis of  By: Arnoldo Morale MD, Balinda Quails    CTS (carpal tunnel syndrome) 01/27/2017   Essential hypertension 09/28/2007   Qualifier: Diagnosis of  By: Arnoldo Morale MD, Balinda Quails    FINGER SUP FB W/O MAJ OPEN WOUND&W/O MENTION INF 02/11/2008   Qualifier: Diagnosis of  By: Arnoldo Morale MD, Balinda Quails    Glaucoma    Hyperlipidemia    Hypertension    Osteoarthritis    no per pt   OSTEOARTHRITIS 09/28/2007   Qualifier: Diagnosis of  By: Arnoldo Morale MD, Balinda Quails    Other chronic sinusitis 09/28/2007   Qualifier: Diagnosis of  By: Arnoldo Morale MD, Balinda Quails    PCP NOTES >>>>>> 11/08/2015   PROSTATITIS, ACUTE, CHRONIC 03/26/2009   Qualifier: Diagnosis  of  By: Arnoldo Morale MD, Balinda Quails    SCC (squamous cell carcinoma)    Venous [place]    right inferior clavicle, shave    Past Surgical History:  Procedure Laterality Date   APPENDECTOMY     CATARACT EXTRACTION Right 06-2015   COLONOSCOPY     POLYPECTOMY     TRANSURETHRAL RESECTION OF PROSTATE     UPPER GASTROINTESTINAL ENDOSCOPY      Family History  Problem Relation Age of Onset   Anxiety disorder Other    Hyperlipidemia Father    Hypertension Father    CAD Father    Colon cancer Neg Hx    Prostate cancer Neg Hx    Esophageal cancer Neg Hx    Rectal cancer Neg Hx    Stomach cancer Neg Hx     Social History   Socioeconomic History   Marital status: Married    Spouse name: Not on file   Number of children: 1   Years of education: Not on file   Highest education level: Not on file  Occupational History   Occupation: retired- [employer]    Employer: RETIRED  Tobacco Use   Smoking status: Never   Smokeless tobacco: Never  Vaping Use   Vaping Use: Never  used  Substance and Sexual Activity   Alcohol use: Yes    Alcohol/week: 3.0 standard drinks of alcohol    Types: 3 Glasses of wine per week    Comment: wine   Drug use: No   Sexual activity: Not on file  Other Topics Concern   Not on file  Social History Narrative   Born in [place], raised in [place], moved to [place] in the 60s   Household: pt and wife   Son lives in the [place] Strain: [place]  (08/24/2020)   Overall Financial Resource Strain (CARDIA)    Difficulty of Paying Living Expenses: Not very hard  Food Insecurity: Not on file  Transportation Needs: Not on file  Physical Activity: Not on file  Stress: Not on file  Social Connections: Not on file  Intimate Partner Violence: Not on file    Outpatient Medications Prior to Visit  Medication Sig Dispense Refill   ALPRAZolam (XANAX) 0.25 MG tablet Take 0.25 mg by mouth as needed for anxiety.     gabapentin (NEURONTIN)  100 MG capsule Take 1 capsule (100 mg total) by mouth at bedtime. 30 capsule 2   irbesartan-hydrochlorothiazide (AVALIDE) 150-12.5 MG tablet TAKE 1 TABLET BY MOUTH DAILY 90 tablet 1   latanoprost (XALATAN) 0.005 % ophthalmic solution Place 1 drop into both eyes at bedtime.     meclizine (ANTIVERT) 25 MG tablet Take 1 tablet (25 mg total) by mouth 3 (three) times daily as needed for dizziness. 20 tablet 0   pravastatin (PRAVACHOL) 10 MG tablet Take 1 tablet (10 mg total) by mouth daily. 30 tablet 0   Tamsulosin HCl (FLOMAX) 0.4 MG CAPS Take 1 capsule by mouth Daily.     No facility-administered medications prior to visit.    No Known Allergies  ROS    See HPI Objective:    Physical Exam Constitutional:      General: He is not in acute distress.    Appearance: He is well-developed.  HENT:     Head: Normocephalic and atraumatic.  Cardiovascular:     Rate and Rhythm: Normal rate and regular rhythm.     Heart sounds: No murmur heard. Pulmonary:     Effort: Pulmonary effort is normal. No respiratory distress.     Breath sounds: Normal breath sounds. No wheezing or rales.  Musculoskeletal:     Comments: + tenderness to palpation overlying left lower anterior rib cage.   Skin:    General: Skin is warm and dry.  Neurological:     Mental Status: He is alert and oriented to person, place, and time.  Psychiatric:        Behavior: Behavior normal.        Thought Content: Thought content normal.     BP (!) 152/55 (BP Location: Right Arm, Patient Position: Sitting, Cuff Size: Small)   Pulse (!) 52   Temp (!) 97.4 F (36.3 C) (Oral)   Resp 16   Wt 187 lb (84.8 kg)   SpO2 100%   BMI 26.08 kg/m  Wt Readings from Last 3 Encounters:  08/13/22 187 lb (84.8 kg)  03/05/22 186 lb (84.4 kg)  01/27/22 182 lb (82.6 kg)       Assessment & Plan:   Problem List Items Addressed This Visit       Unprioritized   Rib pain on left side - Primary    New. Suspect rib fracture. Will send  for x-ray.  Discussed application of Salan pas patch to affected area as well as extra strength tylenol as needed for pain.        Relevant Orders   DG Ribs Unilateral W/Chest Left    I am having Zaydrian Faythe Casa maintain his tamsulosin, latanoprost, ALPRAZolam, gabapentin, meclizine, irbesartan-hydrochlorothiazide, and pravastatin.  No orders of the defined types were placed in this encounter.

## 2022-08-13 NOTE — Assessment & Plan Note (Addendum)
New. Suspect rib fracture. Will send for x-ray.  Discussed application of Salon Pas patch to affected area as well as extra strength tylenol as needed for pain.

## 2022-08-14 ENCOUNTER — Other Ambulatory Visit: Payer: Self-pay | Admitting: Internal Medicine

## 2022-08-16 ENCOUNTER — Other Ambulatory Visit: Payer: Self-pay | Admitting: Internal Medicine

## 2022-08-21 DIAGNOSIS — H6123 Impacted cerumen, bilateral: Secondary | ICD-10-CM | POA: Diagnosis not present

## 2022-09-03 ENCOUNTER — Ambulatory Visit: Payer: Medicare Other | Attending: Cardiology | Admitting: Cardiology

## 2022-09-03 ENCOUNTER — Encounter: Payer: Self-pay | Admitting: Cardiology

## 2022-09-03 VITALS — BP 120/64 | HR 68 | Ht 71.0 in | Wt 188.0 lb

## 2022-09-03 DIAGNOSIS — I10 Essential (primary) hypertension: Secondary | ICD-10-CM | POA: Diagnosis not present

## 2022-09-03 DIAGNOSIS — E782 Mixed hyperlipidemia: Secondary | ICD-10-CM

## 2022-09-03 NOTE — Patient Instructions (Signed)

## 2022-09-03 NOTE — Progress Notes (Signed)
Cardiology Office Note:    Date:  09/03/2022   ID:  Robert Krause, DOB [birth year], MRN [id number]  PCP:  Robert Branch, MD  Cardiologist:  Robert Lindau, MD   Referring MD: Robert Branch, MD    ASSESSMENT:    1. Essential hypertension   2. Mixed hyperlipidemia    PLAN:    In order of problems listed above:  Primary prevention stressed with the patient.  Importance of compliance with diet and medication stressed any vocalized understanding.  He was advised to walk at least half an hour a day 5 days a week.  Promises to do so. Essential hypertension: Blood pressure stable and diet was emphasized.  Lifestyle modification urged. Mixed dyslipidemia: On lipid-lowering medications.  Lipids followed by primary care.  Lipids evaluated from [facility] sheet and found to be fine. Patient will be seen in follow-up appointment in 12 months or earlier if the patient has any concerns    Medication Adjustments/Labs and Tests Ordered: Current medicines are reviewed at length with the patient today.  Concerns regarding medicines are outlined above.  No orders of the defined types were placed in this encounter.  No orders of the defined types were placed in this encounter.    No chief complaint on file.    History of Present Illness:    Robert Krause is a 88 y.o. male.  Patient has past medical history of essential hypertension and dyslipidemia.  He denies any problems at this time and takes care of activities of daily living.  No chest pain orthopnea or PND.  He is an active gentleman and exercises on a regular basis.  At the time of my evaluation, the patient is alert awake oriented and in no distress.  Past Medical History:  Diagnosis Date   Abdominal pain, right upper quadrant 04/24/2016   ACTINIC KERATOSIS, CHEEK, LEFT 08/28/2009   Qualifier: Diagnosis of  By: Robert Morale MD, Robert Krause    Adenomatous polyp of Robert 10/2005   ALLERGIC RHINITIS 09/28/2007   Qualifier: Diagnosis of  By:  Robert Morale MD, Robert Krause    Annual physical exam >>>>>>>>>>>>>>>>>>> 07/18/2014   Anxiety    Anxiety and depression 05/29/2019   BPH (benign prostatic hyperplasia)    Carpal tunnel syndrome, left upper limb 01/01/2021   Carpal tunnel syndrome, right upper limb 01/01/2021   Chronic interstitial cystitis    Dr Robert Krause   Chronic nausea 11/12/2020   Robert polyp 04/14/2011   Contracture of palmar fascia 10/16/2010   Qualifier: Diagnosis of  By: Robert Morale MD, Robert Krause    CTS (carpal tunnel syndrome) 01/27/2017   Essential hypertension 09/28/2007   Qualifier: Diagnosis of  By: Robert Morale MD, Robert Krause    FINGER SUP FB W/O MAJ OPEN WOUND&W/O MENTION INF 02/11/2008   Qualifier: Diagnosis of  By: Robert Morale MD, Robert Krause    Glaucoma    Hyperlipidemia    Hypertension    Osteoarthritis    no per pt   OSTEOARTHRITIS 09/28/2007   Qualifier: Diagnosis of  By: Robert Morale MD, Robert Krause    Other chronic sinusitis 09/28/2007   Qualifier: Diagnosis of  By: Robert Morale MD, Robert Krause    PCP NOTES >>>>>> 11/08/2015   PROSTATITIS, ACUTE, CHRONIC 03/26/2009   Qualifier: Diagnosis of  By: Robert Morale MD, Robert Krause    Rib pain on left side 08/13/2022   SCC (squamous cell carcinoma)    Venous [place]    right inferior clavicle, shave    Past Surgical  History:  Procedure Laterality Date   APPENDECTOMY     CATARACT EXTRACTION Right 06-2015   COLONOSCOPY     POLYPECTOMY     TRANSURETHRAL RESECTION OF PROSTATE     UPPER GASTROINTESTINAL ENDOSCOPY      Current Medications: Current Meds  Medication Sig   ALPRAZolam (XANAX) 0.25 MG tablet Take 0.25 mg by mouth as needed for anxiety.   gabapentin (NEURONTIN) 100 MG capsule Take 1 capsule (100 mg total) by mouth at bedtime.   irbesartan-hydrochlorothiazide (AVALIDE) 150-12.5 MG tablet Take 1 tablet by mouth daily.   latanoprost (XALATAN) 0.005 % ophthalmic solution Place 1 drop into both eyes at bedtime.   meclizine (ANTIVERT) 25 MG tablet Take 1 tablet (25 mg total) by mouth 3 (three) times daily  as needed for dizziness.   pravastatin (PRAVACHOL) 10 MG tablet Take 1 tablet (10 mg total) by mouth daily.   Tamsulosin HCl (FLOMAX) 0.4 MG CAPS Take 1 capsule by mouth Daily.     Allergies:   Patient has no known allergies.   Social History   Socioeconomic History   Marital status: Married    Spouse name: Not on file   Number of children: 1   Years of education: Not on file   Highest education level: Not on file  Occupational History   Occupation: retired- Company secretary: RETIRED  Tobacco Use   Smoking status: Never   Smokeless tobacco: Never  Vaping Use   Vaping Use: Never used  Substance and Sexual Activity   Alcohol use: Yes    Alcohol/week: 3.0 standard drinks of alcohol    Types: 3 Glasses of wine per week    Comment: wine   Drug use: No   Sexual activity: Not on file  Other Topics Concern   Not on file  Social History Narrative   Born in [place], raised in [place], moved to [place] in the 60s   Household: pt and wife   Son lives in the [place]    Social Determinants of Health   Financial Resource Strain: [place]  (08/24/2020)   Overall Financial Resource Strain (CARDIA)    Difficulty of Paying Living Expenses: Not very hard  Food Insecurity: Not on file  Transportation Needs: Not on file  Physical Activity: Not on file  Stress: Not on file  Social Connections: Not on file     Family History: The patient's family history includes Anxiety disorder in an other family member; CAD in his father; Hyperlipidemia in his father; Hypertension in his father. There is no history of Robert cancer, Prostate cancer, Esophageal cancer, Rectal cancer, or Stomach cancer.  ROS:   Please see the history of present illness.    All other systems reviewed and are negative.  EKGs/Labs/Other Studies Reviewed:    The following studies were reviewed today: EKG-sinus rhythm and nonspecific ST-T changes   Recent Labs: 11/04/2021: ALT 10; TSH 1.60 01/27/2022: BUN 23; Creatinine, Ser  1.07; Hemoglobin 15.2; Platelets 202; Potassium 3.9; Sodium 135  Recent Lipid Panel    Component Value Date/Time   CHOL 141 11/04/2021 1320   TRIG 60.0 11/04/2021 1320   TRIG 55 10/05/2006 1439   HDL 52.70 11/04/2021 1320   CHOLHDL 3 11/04/2021 1320   VLDL 12.0 11/04/2021 1320   LDLCALC 76 11/04/2021 1320   LDLDIRECT 153.4 06/06/2013 1156    Physical Exam:    VS:  BP 120/64   Pulse 68   Ht '5\' 11"'$  (1.803 m)   Wt  188 lb (85.3 kg)   SpO2 98%   BMI 26.22 kg/m     Wt Readings from Last 3 Encounters:  09/03/22 188 lb (85.3 kg)  08/13/22 187 lb (84.8 kg)  03/05/22 186 lb (84.4 kg)     GEN: Patient is in no acute distress HEENT: Normal NECK: No JVD; No carotid bruits LYMPHATICS: No lymphadenopathy CARDIAC: Hear sounds regular, 2/6 systolic murmur at the apex. RESPIRATORY:  Clear to auscultation without rales, wheezing or rhonchi  ABDOMEN: Soft, non-tender, non-distended MUSCULOSKELETAL:  No edema; No deformity  SKIN: Warm and dry NEUROLOGIC:  Alert and oriented x 3 PSYCHIATRIC:  Normal affect   Signed, Robert Lindau, MD  09/03/2022 3:52 PM    [facility]

## 2022-09-18 ENCOUNTER — Encounter: Payer: Self-pay | Admitting: Orthopedic Surgery

## 2022-09-18 ENCOUNTER — Ambulatory Visit (INDEPENDENT_AMBULATORY_CARE_PROVIDER_SITE_OTHER): Payer: Medicare Other | Admitting: Orthopedic Surgery

## 2022-09-18 VITALS — BP 156/83 | HR 53 | Ht 71.0 in | Wt 188.0 lb

## 2022-09-18 DIAGNOSIS — G5601 Carpal tunnel syndrome, right upper limb: Secondary | ICD-10-CM

## 2022-09-18 DIAGNOSIS — G5602 Carpal tunnel syndrome, left upper limb: Secondary | ICD-10-CM | POA: Diagnosis not present

## 2022-09-18 NOTE — Progress Notes (Addendum)
Orthopedic Spine Surgery Office Note  Assessment: Patient is a 88 y.o. male with right sided carpal tunnel syndrome, possibly on the side. Has EMG/NCS evidence of bilateral carpal tunnel syndrome, but no provocative test findings on the left and his pain is more in the forearm   Plan: -He has tried wrist bracing without relief. He is interested in operative management since it bothers him on a daily basis. Since he is taking care of his wife with her cervical spine fracture and works with his hands, he wants to put off an operative intervention until the winter. We talked about starting with the right hand since that is his non-dominant hand and he has exam and symptoms more consistent with carpal tunnel on that side. I would consider doing an injection on the left side at the time of surgery for diagnostic purposes.  -He will return to the office in December to see how he is doing and discuss how he would like to proceed with treatment. XRs needed at that visit: none   Patient expressed understanding of the plan and all questions were answered to the patient's satisfaction.   ___________________________________________________________________________   History:  Patient is a 88 y.o. male who presents today for bilateral carpal tunnel syndrome. Patient has a long history of pain that radiates from his wrist into his fingers on the right side. Notices it is worse at night and in the morning but gets better if he shakes his hand. It initially was just felt in the night or when first getting up but now he has the pain constantly. In regards to his left hand, he also feels that at night and in the morning. However, his pain is more on the ventral wrist and on the ventral forearm. He states it feels like an electrical pain. He does not have pain radiation down from the neck. He got an EMG/NCS on 08/06/2021 that showed moderate/severe left median nerve entrapment and severe right median nerve entrapment at  the wrist. There was no trauma or injury that brought on the pain. He has tried splinting but that did not help. Feels his fingers are numb.   Treatments tried: activity modification, bracing  Review of systems: Denies fevers and chills, night sweats, unexplained weight loss, history of cancer, pain that wakes him at night  Past medical history: Actinic keratosis Carpal tunnel syndrome Allergic rhinitis Anxiety Depression BPH Dupuytren's  HTN HLD Glaucoma SCC  Allergies: NKDA  Past surgical history:  Appendectomy Cataract surgery Polypectomy TURP  Social history: Denies use of nicotine product (smoking, vaping, patches, smokeless) Alcohol use: yes, 3/week Denies recreational drug use   Physical Exam:  General: no acute distress, appears stated age Neurologic: alert, answering questions appropriately, following commands Respiratory: unlabored breathing on room air, symmetric chest rise Psychiatric: appropriate affect, normal cadence to speech  RUE: no thenar or hypothenar atrophy, sensation intact to light touch in m/u/r nerve distributions, hand warm and well perfused, AIN/PIN/IO intact ,palpable radial pulse, positive durkan's, positive tinel's, negative phalens  Negative tinels and phalens at elbow  LUE: no thenar or hypothenar atrophy, sensation intact to light touch in m/u/r nerve distributions, hand warm and well perfused, AIN/PIN/IO intact, palpable radial pulse, negative durkan's, negative tinel's, negative phalen's  Negative tinels and phalens at the elbow  -Spurling: negative bilaterally -Hoffman sign: negative bilaterally  Imaging: No imaging was obtained at this visit   Patient name: Robert Krause Patient MRN: [id number] Date of visit: 09/18/22

## 2022-09-23 ENCOUNTER — Ambulatory Visit: Payer: Medicare Other | Admitting: Gastroenterology

## 2022-10-08 ENCOUNTER — Ambulatory Visit: Payer: Medicare Other | Admitting: Orthopedic Surgery

## 2022-10-15 ENCOUNTER — Ambulatory Visit: Payer: Medicare Other | Admitting: Gastroenterology

## 2022-11-10 ENCOUNTER — Ambulatory Visit: Payer: Medicare Other | Admitting: Orthopedic Surgery

## 2022-11-25 ENCOUNTER — Ambulatory Visit: Payer: Medicare Other | Admitting: Gastroenterology

## 2022-11-25 ENCOUNTER — Encounter: Payer: Self-pay | Admitting: Gastroenterology

## 2022-11-25 VITALS — BP 120/68 | HR 58 | Ht 70.0 in | Wt 183.4 lb

## 2022-11-25 DIAGNOSIS — K5900 Constipation, unspecified: Secondary | ICD-10-CM

## 2022-11-25 DIAGNOSIS — R110 Nausea: Secondary | ICD-10-CM | POA: Diagnosis not present

## 2022-11-25 MED ORDER — PROMETHAZINE HCL 12.5 MG PO TABS: 12.5000 mg | ORAL_TABLET | Freq: Three times a day (TID) | ORAL | 3 refills | Status: DC | PRN

## 2022-11-25 MED ORDER — PANTOPRAZOLE SODIUM 40 MG PO TBEC: 40.0000 mg | DELAYED_RELEASE_TABLET | Freq: Every day | ORAL | 3 refills | Status: DC

## 2022-11-25 NOTE — Patient Instructions (Signed)
_______________________________________________________  If you are age 87 or older, your body mass index should be between 23-30. Your Body mass index is 26.32 kg/m. If this is out of the aforementioned range listed, please consider follow up with your Primary Care Provider.  If you are age 46 or younger, your body mass index should be between 19-25. Your Body mass index is 26.32 kg/m. If this is out of the aformentioned range listed, please consider follow up with your Primary Care Provider.   ________________________________________________________  The [facility] providers would like to encourage you to use [facility] to communicate with providers for non-urgent requests or questions.  Due to long hold times on the telephone, sending your provider a message by [facility] may be a faster and more efficient way to get a response.  Please allow 48 business hours for a response.  Please remember that this is for non-urgent requests.  _______________________________________________________  We have sent the following medications to your pharmacy for you to pick up at your convenience:  Pantoprazole, Phenergan.  You may take Milk of Magnesia for your constipation

## 2022-11-25 NOTE — Progress Notes (Signed)
    Assessment     Recurrent nausea, suspect recurrent erosive gastropathy Mild weight loss secondary to above Constipation    Recommendations    Resume pantoprazole 40 mg daily and Phenergan 12.5 mg 3 times daily as needed Increase daily water and fiber intake.  MOM qd or qod titrated for an adequate bowel movements daily REV in 6 weeks.   HPI    This is an 89 year old male with recurrent nausea and constipation.  He relates that his nausea resolved after his visit in 2021 however returned several months ago.  He also has noted a slightly decreased appetite and of 5 pound weight loss since October.  He notes gradually worsening constipation over the past year and currently is having a hard bowel movement about every 3 to 4 days.  He takes milk of magnesia as needed which has been effective.  No other gastrointestinal complaints.   Labs / Imaging       Latest Ref Rng & Units 11/04/2021    1:20 PM 11/12/2020    9:28 AM 05/15/2020    8:50 AM  Hepatic Function  Total Protein 6.0 - 8.3 g/dL  6.5  6.4   Albumin 3.5 - 5.2 g/dL  3.7  3.8   AST 0 - 37 U/L _0 ALT 0 - 53 U/L _1 Alk Phosphatase 39 - 117 U/L  58  59   Total Bilirubin 0.2 - 1.2 mg/dL  1.1  1.0        Latest Ref Rng & Units 01/27/2022    8:00 PM 07/30/2021    9:33 AM 05/15/2020    8:50 AM  CBC  WBC 4.0 - 10.5 K/uL 5.7  5.6  5.8   Hemoglobin 13.0 - 17.0 g/dL 15.2  15.2  15.5   Hematocrit 39.0 - 52.0 % 43.6  45.5  45.7   Platelets 150 - 400 K/uL 202  167.0  181     Current Medications, Allergies, Past Medical History, Past Surgical History, Family History and Social History were reviewed in [employer] record.   Physical Exam: General: Well developed, well nourished, elderly, no acute distress Head: Normocephalic and atraumatic Eyes: Sclerae anicteric, EOMI Ears: Normal auditory acuity Mouth: No deformities or lesions noted Lungs: Clear throughout to auscultation Heart:  Regular rate and rhythm; No murmurs, rubs or bruits Abdomen: Soft, non tender and non distended. No masses, hepatosplenomegaly or hernias noted. Normal Bowel sounds Rectal: Not done Musculoskeletal: Symmetrical with no gross deformities  Pulses:  Normal pulses noted Extremities: No edema or deformities noted Neurological: Alert oriented x 4, grossly nonfocal Psychological:  Alert and cooperative. Normal mood and affect   Samarra Ridgely T. Fuller Plan, MD 11/25/2022, 8:26 AM

## 2022-12-04 ENCOUNTER — Ambulatory Visit: Payer: Medicare Other | Admitting: Orthopedic Surgery

## 2022-12-04 DIAGNOSIS — G5601 Carpal tunnel syndrome, right upper limb: Secondary | ICD-10-CM | POA: Diagnosis not present

## 2022-12-04 NOTE — Progress Notes (Addendum)
Orthopedic Surgery Office Note   Assessment: Patient is a 89 y.o. male with right sided carpal tunnel syndrome, possible left side too. Has EMG/NCS evidence of bilateral carpal tunnel syndrome, but no provocative test findings on the left. Pain and numbness is more in the hand now on the left side as opposed to the last visit.      Plan: -He has tried wrist bracing and anti-inflammatories without relief.  We talked about injections as a potential treatment option as well, but is not interested in that at this time.  Accordingly, discussed carpal tunnel release as a treatment option.  After covering the risks, benefits, and alternatives, patient elected to proceed with operative management -He will next be seen at the date of surgery     Operative management in the form of right carpal tunnel release was discussed with the patient today.  I covered the risks which included but were not limited to infection, wound dehiscence, stiffness, recurrence, persistent pain or numbness, median nerve injury, injury to one of the flexor tendons, palm numbness.  I told him that the benefits of surgery would be improvement in that pain radiate into his hand and fingertips.  He will hopefully also regain some of the sensation in his hand, but he has had the symptoms for several years now so we will have to monitor to see how much improvement he gets.  The alternatives to surgery would be injection, bracing, anti-inflammatories, and continued monitoring. After this discussion, he expressed understanding and elected to proceed. All his questions were answered to his satisfaction.    ___________________________________________________________________________     History:   Patient is a 89 y.o. male who is following up today on his bilateral hand pain.  He wants to focus more on his right hand today because it is becoming more painful and symptomatic with time.  He has had pain on that side for several years.  Pain  starts in the wrist and radiates into all the fingertips.  Notices that it is worse at night and improves somewhat as the day goes on.  He feels decreased sensation in the entire hand.  He does not have any pain in the forearm or in the arm on that side.  Sometimes gets tinging in the same distribution as the pain, but this is not as bothersome or regular. No radiating pain from the neck on that side.  No trauma or injury brought on the pain.  Of note, he had an EMG/NCS on 08/06/2021 that showed severe right median nerve entrapment at the wrist and moderate to severe left median nerve entrapment. No fibrillations.   Treatments tried: activity modification, bracing, antiinflammatories    Review of systems: Denies fevers and chills, night sweats, unexplained weight loss, history of cancer, pain that wakes him at night   Past medical history: Actinic keratosis Carpal tunnel syndrome Allergic rhinitis Anxiety Depression BPH Dupuytren's  HTN HLD Glaucoma SCC   Allergies: NKDA   Past surgical history:  Appendectomy Cataract surgery Polypectomy TURP   Social history: Denies use of nicotine product (smoking, vaping, patches, smokeless) Alcohol use: yes, 3/week Denies recreational drug use     Physical Exam:   General: no acute distress, appears stated age Neurologic: alert, answering questions appropriately, following commands Respiratory: unlabored breathing on room air, symmetric chest rise Psychiatric: appropriate affect, normal cadence to speech   RUE: no thenar or hypothenar atrophy, decrease but intact to light touch in median nerve distribution, sensation intact to light touch  in radial/ulnar/axillary nerve distributions, hand warm and well perfused, AIN/PIN/IO intact ,palpable radial pulse, positive durkan's, negative tinel's, negative phalens, negative froment sign   Negative tinels and phalens at elbow. Negative spurling. Negative hoffman bilaterally.   Grip strength  was 70 on the right, 75 on the left Pinch strength was 20 pounds on right right, 23 on the left  Imaging: No imaging was obtained at this visit     Patient name: Robert Krause Patient MRN: [id number] Date of visit: 12/04/22

## 2022-12-09 ENCOUNTER — Encounter: Payer: Self-pay | Admitting: Internal Medicine

## 2022-12-09 ENCOUNTER — Ambulatory Visit (INDEPENDENT_AMBULATORY_CARE_PROVIDER_SITE_OTHER): Payer: Medicare Other | Admitting: Internal Medicine

## 2022-12-09 VITALS — BP 126/68 | HR 56 | Temp 98.0°F | Resp 18 | Ht 70.0 in | Wt 186.1 lb

## 2022-12-09 DIAGNOSIS — I10 Essential (primary) hypertension: Secondary | ICD-10-CM

## 2022-12-09 DIAGNOSIS — Z23 Encounter for immunization: Secondary | ICD-10-CM | POA: Diagnosis not present

## 2022-12-09 DIAGNOSIS — E782 Mixed hyperlipidemia: Secondary | ICD-10-CM | POA: Diagnosis not present

## 2022-12-09 DIAGNOSIS — R739 Hyperglycemia, unspecified: Secondary | ICD-10-CM

## 2022-12-09 DIAGNOSIS — Z0000 Encounter for general adult medical examination without abnormal findings: Secondary | ICD-10-CM

## 2022-12-09 LAB — COMPREHENSIVE METABOLIC PANEL
ALT: 10 U/L (ref 0–53)
AST: 15 U/L (ref 0–37)
Albumin: 3.8 g/dL (ref 3.5–5.2)
Alkaline Phosphatase: 55 U/L (ref 39–117)
BUN: 14 mg/dL (ref 6–23)
CO2: 32 mEq/L (ref 19–32)
Calcium: 8.9 mg/dL (ref 8.4–10.5)
Chloride: 95 mEq/L — ABNORMAL LOW (ref 96–112)
Creatinine, Ser: 1 mg/dL (ref 0.40–1.50)
GFR: 68.33 mL/min (ref >60.00)
Glucose, Bld: 105 mg/dL — ABNORMAL HIGH (ref 70–99)
Potassium: 4.2 mEq/L (ref 3.5–5.1)
Sodium: 133 mEq/L — ABNORMAL LOW (ref 135–145)
Total Bilirubin: 1 mg/dL (ref 0.2–1.2)
Total Protein: 6.5 g/dL (ref 6.0–8.3)

## 2022-12-09 LAB — CBC WITH DIFFERENTIAL/PLATELET
Basophils Absolute: 0.1 10*3/uL (ref 0.0–0.1)
Basophils Relative: 1.3 % (ref 0.0–3.0)
Eosinophils Absolute: 0.1 10*3/uL (ref 0.0–0.7)
Eosinophils Relative: 2.9 % (ref 0.0–5.0)
HCT: 44.1 % (ref 39.0–52.0)
Hemoglobin: 15.1 g/dL (ref 13.0–17.0)
Lymphocytes Relative: 35.1 % (ref 12.0–46.0)
Lymphs Abs: 1.7 10*3/uL (ref 0.7–4.0)
MCHC: 34.3 g/dL (ref 30.0–36.0)
MCV: 93 fl (ref 78.0–100.0)
Monocytes Absolute: 0.5 10*3/uL (ref 0.1–1.0)
Monocytes Relative: 9.9 % (ref 3.0–12.0)
Neutro Abs: 2.4 10*3/uL (ref 1.4–7.7)
Neutrophils Relative %: 50.8 % (ref 43.0–77.0)
Platelets: 206 10*3/uL (ref 150.0–400.0)
RBC: 4.74 Mil/uL (ref 4.22–5.81)
RDW: 12.8 % (ref 11.5–15.5)
WBC: 4.8 10*3/uL (ref 4.0–10.5)

## 2022-12-09 LAB — LIPID PANEL
Cholesterol: 146 mg/dL (ref 0–200)
HDL: 53.2 mg/dL (ref >39.00)
LDL Cholesterol: 79 mg/dL (ref 0–99)
NonHDL: 92.84
Total CHOL/HDL Ratio: 3
Triglycerides: 70 mg/dL (ref 0.0–149.0)
VLDL: 14 mg/dL (ref 0.0–40.0)

## 2022-12-09 LAB — HEMOGLOBIN A1C: Hgb A1c MFr Bld: 5.5 % (ref 4.6–6.5)

## 2022-12-09 NOTE — Assessment & Plan Note (Signed)
Here for CPX Prediabetes: Unable to exercise due to weather, recommend to go back to his regular walks once is safe to do so.  Check A1c TGP:QDIYMEBR well-controlled on Avalide.  Checking labs. Saw cardiology 08-2022, they emphasize primary prevention.  Next follow-up 1 year High cholesterol: On Pravachol, checking labs. Anxiety: Hardly ever takes Xanax. Chronic nausea: Saw GI few days ago, Dx recurrent nausea, weight loss noted as well.  Also c/o  constipation was recommended MOM.  They recommended a follow-up in 6 weeks. Interstitial cystitis: On gabapentin, sees urology. RTC 6 months

## 2022-12-09 NOTE — Patient Instructions (Addendum)
Today you are receiving the pneumonia shot.  Vaccines I recommend:  RSV vaccine  Please drop off a copy of your Healthcare Power of Attorney for your chart.   Check the  blood pressure regularly BP GOAL is between 110/65 and  135/85. If it is consistently higher or lower, let me know   GO TO THE LAB : Get the blood work     [place], [place] back for a checkup in 6 months.

## 2022-12-09 NOTE — Assessment & Plan Note (Signed)
-  Td :2017  - pnm 23: 2004, 2016; prevanr  2015; PNM 20: Today - zostavax: 2014 per pt - s/p  shingrix (x2) -COVID vaccine  (UTD)  - had a flu shot  - CCS   cscope 2012, Cscope 12/2017. He is 89 y/o   -Prostate ca screening-- per urology -Diet exercise: Counseled  -Labs: CMP FLP CBC A1c - Healthcare power of attorney: Recommend to bring a copy.

## 2022-12-09 NOTE — Progress Notes (Signed)
Subjective:    Patient ID: Robert Krause, male    DOB: 1934/07/20, 89 y.o.   MRN: [id number]  DOS:  12/09/2022 Type of visit - description: Here for CPX  In general feels well. Has chronic symptoms, at baseline.  Mostly episodic nausea, some constipation. Also LUTS but denies gross hematuria.  Wt Readings from Last 3 Encounters:  12/09/22 186 lb 2 oz (84.4 kg)  11/25/22 183 lb 6.4 oz (83.2 kg)  09/18/22 188 lb (85.3 kg)    BP Readings from Last 3 Encounters:  12/09/22 126/68  11/25/22 120/68  09/18/22 (!) 156/83    Review of Systems  Other than above, a 14 point review of systems is negative     Past Medical History:  Diagnosis Date   Abdominal pain, right upper quadrant 04/24/2016   ACTINIC KERATOSIS, CHEEK, LEFT 08/28/2009   Qualifier: Diagnosis of  By: Arnoldo Morale MD, John E    Adenomatous polyp of colon 10/2005   ALLERGIC RHINITIS 09/28/2007   Qualifier: Diagnosis of  By: Arnoldo Morale MD, Balinda Quails    Annual physical exam >>>>>>>>>>>>>>>>>>> 07/18/2014   Anxiety    Anxiety and depression 05/29/2019   BPH (benign prostatic hyperplasia)    Carpal tunnel syndrome, left upper limb 01/01/2021   Carpal tunnel syndrome, right upper limb 01/01/2021   Chronic interstitial cystitis    Dr Amalia Hailey   Chronic nausea 11/12/2020   Colon polyp 04/14/2011   Contracture of palmar fascia 10/16/2010   Qualifier: Diagnosis of  By: Arnoldo Morale MD, Balinda Quails    CTS (carpal tunnel syndrome) 01/27/2017   Essential hypertension 09/28/2007   Qualifier: Diagnosis of  By: Arnoldo Morale MD, Balinda Quails    FINGER SUP FB W/O MAJ OPEN WOUND&W/O MENTION INF 02/11/2008   Qualifier: Diagnosis of  By: Arnoldo Morale MD, Balinda Quails    Glaucoma    Hyperlipidemia    Hypertension    Osteoarthritis    no per pt   OSTEOARTHRITIS 09/28/2007   Qualifier: Diagnosis of  By: Arnoldo Morale MD, Balinda Quails    Other chronic sinusitis 09/28/2007   Qualifier: Diagnosis of  By: Arnoldo Morale MD, Balinda Quails    PCP NOTES >>>>>> 11/08/2015   PROSTATITIS, ACUTE,  CHRONIC 03/26/2009   Qualifier: Diagnosis of  By: Arnoldo Morale MD, John E    Rib pain on left side 08/13/2022   SCC (squamous cell carcinoma)    Venous lake    right inferior clavicle, shave    Past Surgical History:  Procedure Laterality Date   APPENDECTOMY     CATARACT EXTRACTION Right 06-2015   COLONOSCOPY     POLYPECTOMY     TRANSURETHRAL RESECTION OF PROSTATE     UPPER GASTROINTESTINAL ENDOSCOPY     Social History   Socioeconomic History   Marital status: Married    Spouse name: Not on file   Number of children: 1   Years of education: Not on file   Highest education level: Not on file  Occupational History   Occupation: retired- [employer]    Employer: RETIRED  Tobacco Use   Smoking status: Never   Smokeless tobacco: Never  Vaping Use   Vaping Use: Never used  Substance and Sexual Activity   Alcohol use: Yes    Alcohol/week: 3.0 standard drinks of alcohol    Types: 3 Glasses of wine per week    Comment: wine   Drug use: No   Sexual activity: Not on file  Other Topics Concern   Not on file  Social History  Narrative   Born in [place], raised in [place], moved to [place] in the 60s   Household: pt and wife   Son lives in the [place] Strain: [place]  (08/24/2020)   Overall Financial Resource Strain (CARDIA)    Difficulty of Paying Living Expenses: Not very hard  Food Insecurity: Not on file  Transportation Needs: Not on file  Physical Activity: Not on file  Stress: Not on file  Social Connections: Not on file  Intimate Partner Violence: Not on file    Current Outpatient Medications  Medication Instructions   ALPRAZolam (XANAX) 0.25 mg, Oral, As needed   gabapentin (NEURONTIN) 100 mg, Oral, Daily at bedtime   irbesartan-hydrochlorothiazide (AVALIDE) 150-12.5 MG tablet 1 tablet, Oral, Daily   latanoprost (XALATAN) 0.005 % ophthalmic solution 1 drop, Both Eyes, Daily at bedtime   meclizine (ANTIVERT) 25 mg, Oral, 3 times  daily PRN   pantoprazole (PROTONIX) 40 mg, Oral, Daily   pravastatin (PRAVACHOL) 10 mg, Oral, Daily   promethazine (PHENERGAN) 12.5 mg, Oral, 3 times daily PRN   Tamsulosin HCl (FLOMAX) 0.4 MG CAPS 1 capsule, Daily       Objective:   Physical Exam BP 126/68   Pulse (!) 56   Temp 98 F (36.7 C) (Oral)   Resp 18   Ht '5\' 10"'$  (1.778 m)   Wt 186 lb 2 oz (84.4 kg)   SpO2 98%   BMI 26.71 kg/m  General: Well developed, NAD, BMI noted Neck: No  thyromegaly  HEENT:  Normocephalic . Face symmetric, atraumatic Lungs:  CTA B Normal respiratory effort, no intercostal retractions, no accessory muscle use. Heart: RRR,  no murmur.  Abdomen:  Not distended, soft, non-tender. No rebound or rigidity.   Lower extremities: no pretibial edema bilaterally  Skin: Exposed areas without rash. Not pale. Not jaundice Neurologic:  alert & oriented X3.  Speech normal, gait appropriate for age and unassisted Strength symmetric and appropriate for age.  Psych: Cognition and judgment appear intact.  Cooperative with normal attention span and concentration.  Behavior appropriate. No anxious or depressed appearing.     Assessment     Assessment Prediabetes HTN CV: --Bradycardia: Holter 2016 essentially negative --06/07/2019 dx was  DOE and palpitations: ECHO unremarkable, nuclear stress test EF 45%, no ischemic changes Hyperlipidemia Anxiety: on xanax, stress related to interstitial cystitis, lexapro intol (-2020) Insomnia: On melatonin, not interested in other medications DJD CTS: B, offered surgery before  Obesity: BMI 32 Glaucoma GU: Dr. Amalia Hailey --BPH, TURP --Interstitial cystitis (on gabapentin - xanax prn) Venous lake, right inferior clavicle Chronic nausea: GB U/S 2017 (-),  saw GI, EGD 11-2018: Erosive gastritis, BX with no H. pylori, + reactive gastropathy.   CT abdomen 08-2019: Benign.   PLAN Here for CPX Prediabetes: Unable to exercise due to weather, recommend to go back to his  regular walks once is safe to do so.  Check A1c JHE:RDEYCXKG well-controlled on Avalide.  Checking labs. Saw cardiology 08-2022, they emphasize primary prevention.  Next follow-up 1 year High cholesterol: On Pravachol, checking labs. Anxiety: Hardly ever takes Xanax. Chronic nausea: Saw GI few days ago, Dx recurrent nausea, weight loss noted as well.  Also c/o  constipation was recommended MOM.  They recommended a follow-up in 6 weeks. Interstitial cystitis: On gabapentin, sees urology. RTC 6 months

## 2022-12-22 ENCOUNTER — Encounter (HOSPITAL_COMMUNITY): Payer: Self-pay | Admitting: Orthopedic Surgery

## 2022-12-22 ENCOUNTER — Other Ambulatory Visit: Payer: Self-pay

## 2022-12-22 NOTE — Progress Notes (Signed)
Spoke with pt for pre-op call. Pt speaks fluent English, it was listed on his chart that he spoke [place]. Called numerous times via [employer] and pt did not pick up. When he heard the last interpreter start with instructions he picked up and wanted to know why our number did not start with "[phone]". I explained to him that we had it documented that he needed and interpreter.   Pt denies cardiac history. Pt is treated for HTN. States he is not diabetic.   Shower instructions given to pt and he voiced understanding.

## 2022-12-23 ENCOUNTER — Encounter (HOSPITAL_COMMUNITY)
Admission: RE | Disposition: A | Discharge status: Home / Self Care | Payer: Self-pay | Source: Home / Self Care | Attending: Orthopedic Surgery

## 2022-12-23 ENCOUNTER — Ambulatory Visit (HOSPITAL_COMMUNITY): Payer: Medicare Other | Admitting: Certified Registered"

## 2022-12-23 ENCOUNTER — Encounter (HOSPITAL_COMMUNITY): Payer: Self-pay | Admitting: Orthopedic Surgery

## 2022-12-23 ENCOUNTER — Ambulatory Visit (HOSPITAL_BASED_OUTPATIENT_CLINIC_OR_DEPARTMENT_OTHER): Payer: Medicare Other | Admitting: Certified Registered"

## 2022-12-23 ENCOUNTER — Other Ambulatory Visit: Payer: Self-pay

## 2022-12-23 ENCOUNTER — Ambulatory Visit (HOSPITAL_COMMUNITY)
Admission: RE | Admit: 2022-12-23 | Discharge: 2022-12-23 | Disposition: A | Discharge status: Home / Self Care | Payer: Medicare Other | Attending: Orthopedic Surgery | Admitting: Orthopedic Surgery

## 2022-12-23 DIAGNOSIS — K219 Gastro-esophageal reflux disease without esophagitis: Secondary | ICD-10-CM | POA: Diagnosis not present

## 2022-12-23 DIAGNOSIS — I10 Essential (primary) hypertension: Secondary | ICD-10-CM | POA: Insufficient documentation

## 2022-12-23 DIAGNOSIS — G5601 Carpal tunnel syndrome, right upper limb: Secondary | ICD-10-CM | POA: Insufficient documentation

## 2022-12-23 DIAGNOSIS — F418 Other specified anxiety disorders: Secondary | ICD-10-CM

## 2022-12-23 HISTORY — DX: Gastro-esophageal reflux disease without esophagitis: K21.9

## 2022-12-23 HISTORY — PX: CARPAL TUNNEL RELEASE: SHX101

## 2022-12-23 SURGERY — CARPAL TUNNEL RELEASE
Anesthesia: Monitor Anesthesia Care | Laterality: Right

## 2022-12-23 MED ORDER — LACTATED RINGERS IV SOLN: INTRAVENOUS | Status: DC

## 2022-12-23 MED ORDER — HYDROCODONE-ACETAMINOPHEN 5-325 MG PO TABS: 1.0000 | ORAL_TABLET | ORAL | 0 refills | Status: AC | PRN

## 2022-12-23 MED ORDER — DEXAMETHASONE SODIUM PHOSPHATE 10 MG/ML IJ SOLN
INTRAMUSCULAR | Status: AC
  Filled 2022-12-23: qty 1

## 2022-12-23 MED ORDER — ACETAMINOPHEN 500 MG PO TABS
1000.0000 mg | ORAL_TABLET | Freq: Once | ORAL | Status: AC
  Administered 2022-12-23: 1000 mg via ORAL
  Filled 2022-12-23: qty 2

## 2022-12-23 MED ORDER — LIDOCAINE 2% (20 MG/ML) 5 ML SYRINGE
INTRAMUSCULAR | Status: AC
  Filled 2022-12-23: qty 10

## 2022-12-23 MED ORDER — EPHEDRINE 5 MG/ML INJ
INTRAVENOUS | Status: AC
  Filled 2022-12-23: qty 15

## 2022-12-23 MED ORDER — PROPOFOL 10 MG/ML IV BOLUS
INTRAVENOUS | Status: AC
  Filled 2022-12-23: qty 20

## 2022-12-23 MED ORDER — DEXAMETHASONE SODIUM PHOSPHATE 10 MG/ML IJ SOLN
INTRAMUSCULAR | Status: DC | PRN
  Administered 2022-12-23: 5 mg via INTRAVENOUS

## 2022-12-23 MED ORDER — ONDANSETRON HCL 4 MG/2ML IJ SOLN
INTRAMUSCULAR | Status: DC | PRN
  Administered 2022-12-23: 4 mg via INTRAVENOUS

## 2022-12-23 MED ORDER — BUPIVACAINE-EPINEPHRINE (PF) 0.5% -1:200000 IJ SOLN
INTRAMUSCULAR | Status: AC
  Filled 2022-12-23: qty 30

## 2022-12-23 MED ORDER — PHENYLEPHRINE 80 MCG/ML (10ML) SYRINGE FOR IV PUSH (FOR BLOOD PRESSURE SUPPORT)
PREFILLED_SYRINGE | INTRAVENOUS | Status: AC
  Filled 2022-12-23: qty 10

## 2022-12-23 MED ORDER — LIDOCAINE 2% (20 MG/ML) 5 ML SYRINGE
INTRAMUSCULAR | Status: DC | PRN
  Administered 2022-12-23: 80 mg via INTRAVENOUS

## 2022-12-23 MED ORDER — PROPOFOL 1000 MG/100ML IV EMUL
INTRAVENOUS | Status: AC
  Filled 2022-12-23: qty 100

## 2022-12-23 MED ORDER — FENTANYL CITRATE (PF) 100 MCG/2ML IJ SOLN: 25.0000 ug | INTRAMUSCULAR | Status: DC | PRN

## 2022-12-23 MED ORDER — ORAL CARE MOUTH RINSE: 15.0000 mL | Freq: Once | OROMUCOSAL | Status: AC

## 2022-12-23 MED ORDER — ACETAMINOPHEN 500 MG PO TABS: 500.0000 mg | ORAL_TABLET | Freq: Four times a day (QID) | ORAL | 0 refills | Status: AC | PRN

## 2022-12-23 MED ORDER — GLYCOPYRROLATE 0.2 MG/ML IJ SOLN
INTRAMUSCULAR | Status: DC | PRN
  Administered 2022-12-23: .2 mg via INTRAVENOUS

## 2022-12-23 MED ORDER — PROPOFOL 10 MG/ML IV BOLUS
INTRAVENOUS | Status: DC | PRN
  Administered 2022-12-23: 75 ug/kg/min via INTRAVENOUS

## 2022-12-23 MED ORDER — PROPOFOL 500 MG/50ML IV EMUL
INTRAVENOUS | Status: DC | PRN
  Administered 2022-12-23 (×2): 40 mg via INTRAVENOUS
  Administered 2022-12-23: 20 mg via INTRAVENOUS

## 2022-12-23 MED ORDER — CHLORHEXIDINE GLUCONATE 0.12 % MT SOLN
15.0000 mL | Freq: Once | OROMUCOSAL | Status: AC
  Administered 2022-12-23: 15 mL via OROMUCOSAL
  Filled 2022-12-23: qty 15

## 2022-12-23 MED ORDER — ONDANSETRON HCL 4 MG/2ML IJ SOLN
INTRAMUSCULAR | Status: AC
  Filled 2022-12-23: qty 2

## 2022-12-23 MED ORDER — BUPIVACAINE-EPINEPHRINE 0.5% -1:200000 IJ SOLN
INTRAMUSCULAR | Status: DC | PRN
  Administered 2022-12-23: 10 mL

## 2022-12-23 SURGICAL SUPPLY — 43 items
APL PRP STRL LF DISP 70% ISPRP (MISCELLANEOUS) ×1
BAG COUNTER SPONGE SURGICOUNT (BAG) ×2 IMPLANT
BAG SPNG CNTER NS LX DISP (BAG)
BNDG CMPR 9X4 STRL LF SNTH (GAUZE/BANDAGES/DRESSINGS) ×1
BNDG ELASTIC 3X5.8 VLCR STR LF (GAUZE/BANDAGES/DRESSINGS) ×2 IMPLANT
BNDG ELASTIC 4X5.8 VLCR STR LF (GAUZE/BANDAGES/DRESSINGS) ×2 IMPLANT
BNDG ESMARK 4X9 LF (GAUZE/BANDAGES/DRESSINGS) ×2 IMPLANT
BNDG GAUZE DERMACEA FLUFF 4 (GAUZE/BANDAGES/DRESSINGS) ×2 IMPLANT
BNDG GZE DERMACEA 4 6PLY (GAUZE/BANDAGES/DRESSINGS) ×1
CHLORAPREP W/TINT 26 (MISCELLANEOUS) ×2 IMPLANT
CORD BIPOLAR FORCEPS 12FT (ELECTRODE) ×2 IMPLANT
COVER SURGICAL LIGHT HANDLE (MISCELLANEOUS) ×2 IMPLANT
CUFF TOURN SGL QUICK 18X4 (TOURNIQUET CUFF) ×2 IMPLANT
CUFF TOURN SGL QUICK 24 (TOURNIQUET CUFF)
CUFF TRNQT CYL 24X4X16.5-23 (TOURNIQUET CUFF) IMPLANT
DRAPE SURG 17X23 STRL (DRAPES) ×2 IMPLANT
DRSG XEROFORM 1X8 (GAUZE/BANDAGES/DRESSINGS) IMPLANT
GAUZE PAD ABD 8X10 STRL (GAUZE/BANDAGES/DRESSINGS) ×4 IMPLANT
GAUZE SPONGE 4X4 12PLY STRL (GAUZE/BANDAGES/DRESSINGS) ×2 IMPLANT
GAUZE XEROFORM 1X8 LF (GAUZE/BANDAGES/DRESSINGS) ×2 IMPLANT
GLOVE BIO SURGEON STRL SZ7.5 (GLOVE) ×4 IMPLANT
GLOVE BIOGEL PI IND STRL 8 (GLOVE) ×2 IMPLANT
GOWN STRL REUS W/ TWL LRG LVL3 (GOWN DISPOSABLE) ×4 IMPLANT
GOWN STRL REUS W/TWL LRG LVL3 (GOWN DISPOSABLE) ×2
KIT BASIN OR (CUSTOM PROCEDURE TRAY) ×2 IMPLANT
KIT TURNOVER KIT B (KITS) ×2 IMPLANT
NDL HYPO 25GX1X1/2 BEV (NEEDLE) IMPLANT
NEEDLE HYPO 25GX1X1/2 BEV (NEEDLE) IMPLANT
NS IRRIG 1000ML POUR BTL (IV SOLUTION) ×2 IMPLANT
PACK ORTHO EXTREMITY (CUSTOM PROCEDURE TRAY) ×2 IMPLANT
PAD ARMBOARD 7.5X6 YLW CONV (MISCELLANEOUS) ×4 IMPLANT
PADDING CAST ABS COTTON 4X4 ST (CAST SUPPLIES) ×2 IMPLANT
SPONGE T-LAP 4X18 ~~LOC~~+RFID (SPONGE) ×2 IMPLANT
SUCTION FRAZIER HANDLE 10FR (MISCELLANEOUS)
SUCTION TUBE FRAZIER 10FR DISP (MISCELLANEOUS) IMPLANT
SUT ETHILON 3 0 PS 1 (SUTURE) ×2 IMPLANT
SUT VIC AB 3-0 SH 18 (SUTURE) ×2 IMPLANT
SYR CONTROL 10ML LL (SYRINGE) IMPLANT
TOWEL GREEN STERILE (TOWEL DISPOSABLE) ×2 IMPLANT
TOWEL GREEN STERILE FF (TOWEL DISPOSABLE) ×2 IMPLANT
TUBE CONNECTING 12X1/4 (SUCTIONS) IMPLANT
UNDERPAD 30X36 HEAVY ABSORB (UNDERPADS AND DIAPERS) ×2 IMPLANT
WATER STERILE IRR 1000ML POUR (IV SOLUTION) ×2 IMPLANT

## 2022-12-23 NOTE — Progress Notes (Signed)
Orthopedic Tech Progress Note Patient Details:  Robert Krause [birth year] [id number]  PACU RN called requesting an ARM SLING   Ortho Devices Type of Ortho Device: Arm sling Ortho Device/Splint Location: RUE Ortho Device/Splint Interventions: Ordered   Post Interventions Patient Tolerated: Well Instructions Provided: Care of device  Janit Pagan 12/23/2022, 2:44 PM

## 2022-12-23 NOTE — Transfer of Care (Signed)
22Immediate Anesthesia Transfer of Care Note  Patient: Robert Krause  Procedure(s) Performed: RIGHT CARPAL TUNNEL RELEASE (Right)  Patient Location: PACU  Anesthesia Type:General  Level of Consciousness: awake  Airway & Oxygen Therapy: Patient Spontanous Breathing and Patient connected to nasal cannula oxygen  Post-op Assessment: Report given to RN and Post -op Vital signs reviewed and stable  Post vital signs: Reviewed and stable  Last Vitals:  Vitals Value Taken Time  BP    Temp    Pulse 66 12/23/22 1403  Resp    SpO2 97 % 12/23/22 1403  Vitals shown include unvalidated device data.  Last Pain:  Vitals:   12/23/22 1152  TempSrc:   PainSc: 0-No pain      Patients Stated Pain Goal: 0 (75/30/10 4045)  Complications: No notable events documented.

## 2022-12-23 NOTE — Anesthesia Postprocedure Evaluation (Signed)
Anesthesia Post Note  Patient: Robert Krause  Procedure(s) Performed: RIGHT CARPAL TUNNEL RELEASE (Right)     Patient location during evaluation: PACU Anesthesia Type: MAC Level of consciousness: awake and alert, patient cooperative and oriented Pain management: pain level controlled Vital Signs Assessment: post-procedure vital signs reviewed and stable Respiratory status: spontaneous breathing, nonlabored ventilation and respiratory function stable Cardiovascular status: stable and blood pressure returned to baseline Postop Assessment: no apparent nausea or vomiting and adequate PO intake Anesthetic complications: no   No notable events documented.  Last Vitals:  Vitals:   12/23/22 1403 12/23/22 1415  BP: 105/63 116/70  Pulse: 66 (!) 58  Resp: 13 15  Temp: (!) 36.2 C   SpO2: 97% 99%    Last Pain:  Vitals:   12/23/22 1403  TempSrc:   PainSc: 0-No pain                 Charod Slawinski,E. Sisto Granillo

## 2022-12-23 NOTE — H&P (Signed)
Orthopedic Surgery H&P Note  Assessment: Patient is a 89 y.o. male with right carpal tunnel syndrome   Plan: -To OR when ready -Site marked -Consent verified -Antibiotics: none -Anticipate discharge to home after surgery   Operative management in the form of right carpal tunnel release was again discussed with the patient today.  I covered the risks which included but were not limited to infection, wound dehiscence, stiffness, recurrence, persistent pain or numbness, median nerve injury, injury to one of the flexor tendons, palm numbness.  I told him that the benefits of surgery would be improvement in that pain radiate into his hand and fingertips.  He will hopefully also regain some of the sensation in his hand, but he has had the symptoms for several years now so we will have to monitor to see how much improvement he gets.  The alternatives to surgery would be injection, bracing, anti-inflammatories, and continued monitoring. After this discussion, he expressed understanding and elected to proceed. All his questions were answered to his satisfaction.    ___________________________________________________________________________   Chief complaint: right hand pain and numbness  History:  Patient is a 89 y.o. male who was previously seen in the office for bilateral hand pain (R>L). He has more symptoms on the right side. It is becoming more painful and he has lost more sensation in his hand on the right side. He had an EMG that was consistent with carpal tunnel syndrome. Symptoms have been present for several years and have gotten worse with time. At out last visit, we discussed injection versus surgical release. He was interested in surgical release since symptoms have been present for awhile and he did not wanted to minimize the chance of return of symptoms. He presents today for carpal tunnel release. No change in his symptoms. See office notes for further details  Review of  systems: General: denies fevers and chills, myalgias Neurologic: denies recent changes in vision, slurred speech Abdomen: denies nausea, vomiting, hematemesis Respiratory: denies cough, shortness of breath  Past medical history: Actinic keratosis Carpal tunnel syndrome Allergic rhinitis Anxiety Depression BPH Dupuytren's  HTN HLD Glaucoma SCC   Allergies: NKDA   Past surgical history:  Appendectomy Cataract surgery Polypectomy TURP   Social history: Denies use of nicotine product (smoking, vaping, patches, smokeless) Alcohol use: yes, 3/week Denies recreational drug use  Family history: -reviewed and not pertinent to carpal tunnel syndrome  Physical Exam:  General: no acute distress, appears stated age Neurologic: alert, answering questions appropriately, following commands Cardiovascular: regular rate, no cyanosis Respiratory: unlabored breathing on room air, symmetric chest rise Psychiatric: appropriate affect, normal cadence to speech  RUE: no thenar or hypothenar atrophy, decrease but intact to light touch in median nerve distribution, sensation intact to light touch in radial/ulnar/axillary nerve distributions, hand warm and well perfused, AIN/PIN/IO intact ,palpable radial pulse, positive durkan's, negative tinel's, negative phalens,   EMG/NCS on 08/06/2021 that showed severe right median nerve entrapment at the wrist and moderate to severe left median nerve entrapment. No fibrillations   Patient name: Robert Krause Patient MRN: [id number] Date: 12/23/22

## 2022-12-23 NOTE — Op Note (Signed)
Orthopedic Spine Surgery Operative Report  Procedure: Right carpal tunnel release  Modifier: none  Date of procedure: 12/23/2022  Patient name: Robert Krause MRN: [id number] DOB: 01-23-1934  Surgeon: Ileene Rubens, MD Assistant: None Pre-operative diagnosis: right carpal tunnel syndrome Post-operative diagnosis: same as above Findings: thickened transverse carpal ligament  Specimens: none Anesthesia: MAC EBL: 5cc Complications: none Pre-incision antibiotic: none  Implants: none   Indication for procedure: Patient is a 89 y.o. male who presented to the office with symptoms consistent with right carpal tunnel syndrome. He also had an EMG/NCS that was consistent with carpal tunnel syndrome. He had tried bracing but that was not providing him with adequate relief. His symptoms got progressively worse so injection was discussed. He was not interested in that treatment since he wanted to minimize his chance of recurrence. Accordingly, operative management in the form of right carpal tunnel release was discussed with him. The risks were covered which included but were not limited to infection, wound dehiscence, stiffness, recurrence, persistent pain or numbness, median nerve injury, injury to one of the flexor tendons, palm numbness. I told him that the benefits of surgery would be improvement in that pain that radiates into his hand and fingertips.  He will hopefully regain some of the sensation in his hand, but he has had the symptoms for several years now so we will have to monitor to see how much improvement in his sensation occurs.  The alternatives to surgery would be injection, bracing, anti-inflammatories, and continued monitoring. After this discussion, he expressed understanding and elected to proceed with surgery. All his questions were answered to his satisfaction.     Procedure Description: The patient was met in the pre-operative holding area. The patient's identity and consent  were verified. The operative site was marked. The patient's remaining questions about the surgery were answered. The patient was brought back to the operating room. He was transferred in the supine position on the operating table. All bony prominences were well padded. A well padded forearm tourniquet was placed. The surgical area was cleansed with alcohol. The patient's skin was then prepped and draped in a standard, sterile fashion. A time out was performed that identified the patient, the procedure, and the operative site. All team members agreed with what was stated in the time out.   A vertical incision in line with radial aspect of the ring finder from the cup of Diogenes to the wrist crease. An esmarch was used to exsanguinate the extremity and the tourniquet was inflated to 227mHg. 10cc of 0.5% Marcaine with epinephrine was injected into the skin and soft tissue in line with the marked incision. A 15 blade was used to make an incision through the skin and dermis in line with the previously marked incision. Blunt dissection with ragnell and scissors was used to dissect down to the transverse carpal ligament. The transverse carpal ligament was then visualized in the wound. 15 blade knife was used to incise the transverse carpal ligament in line with the length of the skin incision. The knife was used to incise the ligament distally until fat was seen distal to the ligament. A tenotomy scissors was placed on top of the medial nerve and slid underneath the transverse carpal ligament proximally to past the wrist crease and was used to cut any remaining ligament. A ragnell retractor was then easily passed above the median nerve both proximally and distally. No further areas of compression were seen.   The wound was irrigated with sterile  saline. Tourniquet was let down. Hemostasis was achieved. The skin as closed with a 3-0 nylon in horizontal mattress fashion. All counts were correct at the end of the case.  The incision was dressed with xeroform, gauze, and ace wrap. The patient was transferred back to a bed and brought to the post-anesthesia care unit by anesthesia staff in stable condition.  Post-operative plan: The patient will recover in the post-anesthesia care unit and then go home. He will leave the dressings in place for 3 days and then will be allowed to let soap/water run over the incision and can keep it open to air. He will be encouraged to move his fingers and wrist. He will be seen in the office in approximately 2 weeks.   Ileene Rubens, MD Orthopedic Surgeon

## 2022-12-23 NOTE — Discharge Instructions (Signed)
Orthopedic Surgery Discharge Instructions  Patient name: Robert Krause Procedure Performed: right carpal tunnel release Date of Surgery: 12/23/2022 Surgeon: Ileene Rubens, MD  Pre-operative Diagnosis: right carpal tunnel syndrome Post-operative Diagnosis: same as above  Discharge Date: 12/23/2022 Discharged to: home Discharge Condition: stable  Activity: You are encouraged to move the fingers and wrist as much as possible during the post-operative period to prevent stiffness. You should not do any heavy lifting with the right hand. You should try to keep the lifting on the right hand to the weight of a coffee cup or less. Once this incision is healed, you can return to full activity.   Incision Care: Your incision site has a dressing over it. That dressing should remain in place and dry at all times for a total of three days after surgery. On 12/27/2022, you can remove the dressing. Underneath the dressing, you will find stitches. You should leave the stitches in place. They will be removed in the office. Do not pick, rub, or scrub at them. Do not put cream or lotion over the surgical area. After three days and once the dressing is off, it is okay to let soap and water run over your incision. Again, do not pick, scrub, or rub at the stitches when bathing. Do not submerge (e.g., take a bath, swim, go in a hot tub, etc.) until six weeks after surgery. There may be some bloody drainage from the incision into the dressing after surgery. This is normal. You do not need to replace the dressing. Continue to leave it in place for the three days as instructed above. Should the dressing become saturated with blood or drainage, please call the office for further instructions.   Medications: You have been prescribed Norco. This is a narcotic pain medication and should only be taken as prescribed. You should not drink alcohol or operate heavy machinery (including driving) while taking this medication. The  Norco can cause constipation as a side effect. You can use over the counter miralax if you develop this symptom. Should you remain constipated, please increase the dose of miralax to twice daily. Tylenol has been prescribed to be taken every 6 hours, which will give you additional pain relief.  You can use over-the-counter NSAIDs (ibuprofen, Aleve, Celebrex, naproxen, meloxicam, etc.) for additional pain relief after this surgery. These medications are safe to take with the Tylenol you have been prescribed. You should not take these medications if you have or have had kidney problems or gastrointestinal ulcers. Take these medications as instructed on the packaging.   State law only allows for opioid prescriptions one week at a time. If you are running out of opioid medication near the end of the week, please call the office during business hours before running out so I can send you another prescription.   You may resume any home blood thinners (warfarin, lovenox, apixaban, plavix, xarelto, etc) 72 hours after your surgery. Take these medications as they were previously prescribed.  Driving: You should not drive while taking narcotic pain medications. You should start getting back to driving slowly and you may want to try driving in a parking lot before doing anything more.   Diet: You are safe to resume your regular diet after surgery.   Reasons to Call the Office After Surgery: You should feel free to call the office with any concerns or questions you have in the post-operative period, but you should definitely notify the office if you develop: -shortness of breath, chest  pain, or trouble breathing -excessive bleeding, drainage, redness, or swelling around the surgical site -fevers, chills, or pain that is getting worse with each passing day -persistent nausea or vomiting -new weakness in the hand -new or worsening numbness or tingling in the hand -other concerns about your surgery  Follow Up  Appointments: You should have an office appointment scheduled for approximately two weeks after surgery. If you do not remember when this appointment is or do not already have it scheduled, please call the office to schedule.   Office Information:  -Ileene Rubens, MD -Phone number: [phone] -Address: [address]       [place], [place] [address]

## 2022-12-23 NOTE — Progress Notes (Addendum)
Orthopedic Surgery Progress Note   Assessment: Patient is a 89 y.o. male with right carpal tunnel syndrome status post CTR   Plan: -Operative plans: complete -Diet: regular -Antibiotics: none -Encouraged to move wrist and fingers as much as desired -Coffee cup weight bearing right upper extremity -Pain control -Dispo: discharge to home  ___________________________________________________________________________  Subjective: No acute events since surgery. Recovering in PACU. Pain radiating into fingers is improved. No nausea or emesis.    Physical Exam:  General: no acute distress, appears stated age Neurologic: alert, answering questions appropriately, following commands Respiratory: unlabored breathing on room air, symmetric chest rise Psychiatric: appropriate affect, normal cadence to speech  MSK:   -Right upper extremity  Dressing in place of the hand and wrist and is c/d/i Flexes and extends all fingers  AIN/PIN/IO intact  Sensation intact to light touch in median/ulnar/radial/axillary nerve distributions. Sensation intact in palm  Hand warm and well perfused, palpable radial pulse, cap refill<2s   Patient name: Robert Krause Patient MRN: [id number] Date: 12/23/22

## 2022-12-23 NOTE — Brief Op Note (Signed)
12/23/2022  2:03 PM  PATIENT:  Robert Krause  89 y.o. male  PRE-OPERATIVE DIAGNOSIS:  RIGHT CARPAL TUNNEL SYNDROME  POST-OPERATIVE DIAGNOSIS:  RIGHT CARPAL TUNNEL SYNDROME  PROCEDURE:  Procedure(s): RIGHT CARPAL TUNNEL RELEASE (Right)  SURGEON:  Surgeon(s) and Role:    * Callie Fielding, MD - Primary  PHYSICIAN ASSISTANT:   ASSISTANTS: none   ANESTHESIA:   MAC  EBL:  5cc   BLOOD ADMINISTERED:none  DRAINS: none   LOCAL MEDICATIONS USED: 0.5% marcaine with epinephrine  SPECIMEN:  No Specimen  DISPOSITION OF SPECIMEN:  N/A  COUNTS:  YES  TOURNIQUET:   Total Tourniquet Time Documented: Forearm (Right) - 20 minutes Total: Forearm (Right) - 20 minutes   DICTATION: .Note written in EPIC  PLAN OF CARE: Discharge to home after PACU  PATIENT DISPOSITION:  PACU - hemodynamically stable.   Delay start of Pharmacological VTE agent (>24hrs) due to surgical blood loss or risk of bleeding: no

## 2022-12-23 NOTE — Anesthesia Preprocedure Evaluation (Signed)
Anesthesia Evaluation  Patient identified by MRN, date of birth, ID band Patient awake    Reviewed: Allergy & Precautions, NPO status , Patient's Chart, lab work & pertinent test results  History of Anesthesia Complications Negative for: history of anesthetic complications  Airway Mallampati: II  TM Distance: >3 FB Neck ROM: Full    Dental  (+) Missing, Dental Advisory Given, Caps   Pulmonary neg pulmonary ROS   breath sounds clear to auscultation       Cardiovascular hypertension, Pt. on medications (-) angina  Rhythm:Regular Rate:Normal  '20 ECHO: EF 60-65%, normal LVF, normal RVF, no significant valvular abnormalities   Neuro/Psych   Anxiety Depression    negative neurological ROS     GI/Hepatic Neg liver ROS,GERD  Medicated and Controlled,,  Endo/Other  negative endocrine ROS    Renal/GU negative Renal ROS     Musculoskeletal   Abdominal   Peds  Hematology   Anesthesia Other Findings   Reproductive/Obstetrics                              Anesthesia Physical Anesthesia Plan  ASA: 3  Anesthesia Plan: MAC   Post-op Pain Management: Tylenol PO (pre-op)* and Minimal or no pain anticipated   Induction: Intravenous  PONV Risk Score and Plan: 1 and Treatment may vary due to age or medical condition  Airway Management Planned: Natural Airway and Nasal Cannula  Additional Equipment: None  Intra-op Plan:   Post-operative Plan:   Informed Consent: I have reviewed the patients History and Physical, chart, labs and discussed the procedure including the risks, benefits and alternatives for the proposed anesthesia with the patient or authorized representative who has indicated his/her understanding and acceptance.     Dental advisory given  Plan Discussed with: CRNA and Surgeon  Anesthesia Plan Comments:          Anesthesia Quick Evaluation

## 2022-12-24 ENCOUNTER — Encounter (HOSPITAL_COMMUNITY): Payer: Self-pay | Admitting: Orthopedic Surgery

## 2023-01-05 ENCOUNTER — Encounter: Payer: Self-pay | Admitting: Orthopedic Surgery

## 2023-01-05 ENCOUNTER — Ambulatory Visit (INDEPENDENT_AMBULATORY_CARE_PROVIDER_SITE_OTHER): Payer: Medicare Other | Admitting: Orthopedic Surgery

## 2023-01-05 VITALS — BP 134/77 | HR 61 | Ht 70.0 in | Wt 186.0 lb

## 2023-01-05 DIAGNOSIS — G5601 Carpal tunnel syndrome, right upper limb: Secondary | ICD-10-CM

## 2023-01-05 NOTE — Progress Notes (Signed)
Orthopedic Surgery Post-operative Office Visit  Procedure: right carpal tunnel release Date of Surgery: 12/23/2022  Assessment: Patient is a 89 y.o. who still has decreased sensation in his right hand after carpal tunnel release   Plan: -Operative plans complete -Sutures removed today in the office -Out of bed as tolerated, no brace -Okay to let soap and water run over the incision, do not submerge -Pain management: OTC medications as needed -Return to office in 4 weeks, lumbar x-rays needed at next visit: none  ___________________________________________________________________________   Subjective: Has been doing well since surgery. Not having any pain or paresthesias in his right hand. Not taking any pain medication. He still has decreased sensation in all his fingers on the right side. Has not noticed any improvement in the sensation since surgery. Has not noticed any redness or drainage from the incision.   Objective:  General: no acute distress, appropriate affect Neurologic: alert, answering questions appropriately, following commands Respiratory: unlabored breathing on room air Skin: incision over the volar right wrist appears well approximated with nylon suture in place, no active or expressible drainage, no erythema around the incision  R hand exam:  -Anterior interosseous nerve, posterior interosseous nerve, ulnar nerve intact -Palpable radial pulse -Cap refill <2s -Flexes and extends fingers -Decreased but intact sensation to light touch in thumb, index, middle, ring, and small finger on the right side. Sensation intact to light touch in median, ulnar, radial distributions on the left side.   Imaging: None obtained at today's visit    Patient name: Robert Krause Patient MRN: [id number] Date of visit: 01/05/23

## 2023-01-12 ENCOUNTER — Ambulatory Visit: Payer: Medicare Other | Admitting: Gastroenterology

## 2023-02-04 ENCOUNTER — Encounter: Payer: Medicare Other | Admitting: Orthopedic Surgery

## 2023-02-09 ENCOUNTER — Other Ambulatory Visit: Payer: Self-pay | Admitting: Internal Medicine

## 2023-02-11 ENCOUNTER — Ambulatory Visit: Payer: Medicare Other | Admitting: Gastroenterology

## 2023-02-13 DIAGNOSIS — N3080 Other cystitis without hematuria: Secondary | ICD-10-CM | POA: Diagnosis not present

## 2023-02-13 DIAGNOSIS — N3010 Interstitial cystitis (chronic) without hematuria: Secondary | ICD-10-CM | POA: Diagnosis not present

## 2023-02-16 ENCOUNTER — Other Ambulatory Visit: Payer: Self-pay | Admitting: Internal Medicine

## 2023-03-05 ENCOUNTER — Encounter: Payer: Self-pay | Admitting: Orthopedic Surgery

## 2023-03-05 NOTE — Progress Notes (Signed)
Orthopedic Surgery Post-operative Office Visit   Procedure: right carpal tunnel release Date of Surgery: 12/23/2022   Assessment: Patient is a 89 y.o. who has had improvement in his pain and minimal improvement in his sensation after carpal tunnel release     Plan: -Operative plans complete -Okay to submerge wound at this time -Can return to full activity as tolerated -Pain management: OTC medications as needed -Patient will return to the office on an as-needed basis   ___________________________________________________________________________     Subjective: Patient states he is not having any pain around the wrist where the incision was.  The pain rating into his hand is better.  He now has pain after working with his hand for a while.  It gets better if he rests it.  He still has decreased sensation throughout the hand.  He said the sensation is a little bit improved since surgery but not significantly improved.  He has not noticed any redness or drainage from the incision.  No new numbness or paresthesias.   Objective:   General: no acute distress, appropriate affect Neurologic: alert, answering questions appropriately, following commands Respiratory: unlabored breathing on room air Skin: incision over the volar right wrist appears well healed   R hand exam:  -AIN/PIN/IO intact -Palpable radial pulse -Cap refill <2s -Flexes and extends fingers -Decreased but intact sensation to light touch in thumb, index, middle, ring, and small finger on the right side. Sensation intact to light touch in median, ulnar, radial distributions on the left side.   Grip strength 65 on the right, 75 on the left Pinch strength was 18 pounds on right right, 23 on the left   Imaging: None obtained at today's visit      Patient name: Robert Krause Patient MRN: [id number] Date of visit: 03/05/23

## 2023-04-27 DIAGNOSIS — H401131 Primary open-angle glaucoma, bilateral, mild stage: Secondary | ICD-10-CM | POA: Diagnosis not present

## 2023-05-04 ENCOUNTER — Ambulatory Visit: Payer: Medicare Other | Admitting: Gastroenterology

## 2023-05-04 ENCOUNTER — Other Ambulatory Visit (INDEPENDENT_AMBULATORY_CARE_PROVIDER_SITE_OTHER): Payer: Medicare Other

## 2023-05-04 ENCOUNTER — Encounter: Payer: Self-pay | Admitting: Gastroenterology

## 2023-05-04 VITALS — BP 110/70 | HR 61 | Ht 70.0 in | Wt 193.0 lb

## 2023-05-04 DIAGNOSIS — R110 Nausea: Secondary | ICD-10-CM

## 2023-05-04 DIAGNOSIS — K5900 Constipation, unspecified: Secondary | ICD-10-CM | POA: Diagnosis not present

## 2023-05-04 LAB — CBC WITH DIFFERENTIAL/PLATELET
Basophils Absolute: 0.1 10*3/uL (ref 0.0–0.1)
Basophils Relative: 1.2 % (ref 0.0–3.0)
Eosinophils Absolute: 0.1 10*3/uL (ref 0.0–0.7)
Eosinophils Relative: 2.7 % (ref 0.0–5.0)
HCT: 44.2 % (ref 39.0–52.0)
Hemoglobin: 14.9 g/dL (ref 13.0–17.0)
Lymphocytes Relative: 31 % (ref 12.0–46.0)
Lymphs Abs: 1.6 10*3/uL (ref 0.7–4.0)
MCHC: 33.8 g/dL (ref 30.0–36.0)
MCV: 94.4 fl (ref 78.0–100.0)
Monocytes Absolute: 0.5 10*3/uL (ref 0.1–1.0)
Monocytes Relative: 10.2 % (ref 3.0–12.0)
Neutro Abs: 2.9 10*3/uL (ref 1.4–7.7)
Neutrophils Relative %: 54.9 % (ref 43.0–77.0)
Platelets: 197 10*3/uL (ref 150.0–400.0)
RBC: 4.68 Mil/uL (ref 4.22–5.81)
RDW: 13 % (ref 11.5–15.5)
WBC: 5.2 10*3/uL (ref 4.0–10.5)

## 2023-05-04 LAB — COMPREHENSIVE METABOLIC PANEL
ALT: 13 U/L (ref 0–53)
AST: 17 U/L (ref 0–37)
Albumin: 3.8 g/dL (ref 3.5–5.2)
Alkaline Phosphatase: 50 U/L (ref 39–117)
BUN: 14 mg/dL (ref 6–23)
CO2: 29 mEq/L (ref 19–32)
Calcium: 8.9 mg/dL (ref 8.4–10.5)
Chloride: 97 mEq/L (ref 96–112)
Creatinine, Ser: 0.95 mg/dL (ref 0.40–1.50)
GFR: 72.47 mL/min (ref >60.00)
Glucose, Bld: 101 mg/dL — ABNORMAL HIGH (ref 70–99)
Potassium: 4.2 mEq/L (ref 3.5–5.1)
Sodium: 134 mEq/L — ABNORMAL LOW (ref 135–145)
Total Bilirubin: 0.9 mg/dL (ref 0.2–1.2)
Total Protein: 6.9 g/dL (ref 6.0–8.3)

## 2023-05-04 LAB — TSH: TSH: 2.06 u[IU]/mL (ref 0.35–5.50)

## 2023-05-04 MED ORDER — PROMETHAZINE HCL 12.5 MG PO TABS: 12.5000 mg | ORAL_TABLET | Freq: Two times a day (BID) | ORAL | 0 refills | Status: DC | PRN

## 2023-05-04 NOTE — Progress Notes (Signed)
    Assessment     Chronic nausea with infrequent vomiting. R/O gastritis, ulcer, GERD, vertigo   Chronic constipation   Recommendations    ENT appt is scheduled this week Continue pantoprazole 40 mg po qd, refill phenergan 12.5 mg po bid prn  CBC, CMP, TSH, tTG, IgA Schedule EGD. The risks (including bleeding, perforation, infection, missed lesions, medication reactions and possible hospitalization or surgery if complications occur), benefits, and alternatives to endoscopy with possible biopsy and possible dilation were discussed with the patient and they consent to proceed.   Benefiber qd and MOM qd prn   HPI    This is an 89 year old male with chronic nausea, infrequent vomiting.  He is accompanied by his wife.  His symptoms improved while taking Phenergan and since discontinuing Phenergan his symptoms returned.  He as gained 10 lbs. since Jan 2024. He relates recurrent dizziness and he was diagnosed with vertigo.  He notes episodes of nausea are often associated with dizziness.  He remains on pantoprazole daily.  EGD Dec 2020 - Normal esophagus.  - Erosive gastropathy with no bleeding and no stigmata of recent bleeding. Biopsied.  - Normal duodenal bulb and second portion of the duodenum.   Labs / Imaging       Latest Ref Rng & Units 12/09/2022    8:40 AM 11/04/2021    1:20 PM 11/12/2020    9:28 AM  Hepatic Function  Total Protein 6.0 - 8.3 g/dL 6.5   6.5   Albumin 3.5 - 5.2 g/dL 3.8   3.7   AST 0 - 37 U/L 15  15  17    ALT 0 - 53 U/L 10  10  14    Alk Phosphatase 39 - 117 U/L 55   58   Total Bilirubin 0.2 - 1.2 mg/dL 1.0   1.1        Latest Ref Rng & Units 12/09/2022    8:40 AM 01/27/2022    8:00 PM 07/30/2021    9:33 AM  CBC  WBC 4.0 - 10.5 K/uL 4.8  5.7  5.6   Hemoglobin 13.0 - 17.0 g/dL 78.2  95.6  21.3   Hematocrit 39.0 - 52.0 % 44.1  43.6  45.5   Platelets 150.0 - 400.0 K/uL 206.0  202  167.0    Current Medications, Allergies, Past Medical History, Past  Surgical History, Family History and Social History were reviewed in [employer] record.   Physical Exam: General: Well developed, well nourished, no acute distress Head: Normocephalic and atraumatic Eyes: Sclerae anicteric, EOMI Ears: Normal auditory acuity Mouth: No deformities or lesions noted Lungs: Clear throughout to auscultation Heart: Regular rate and rhythm; No murmurs, rubs or bruits Abdomen: Soft, non tender and non distended. No masses, hepatosplenomegaly or hernias noted. Normal Bowel sounds Rectal: Not done Musculoskeletal: Symmetrical with no gross deformities  Pulses:  Normal pulses noted Extremities: No edema or deformities noted Neurological: Alert oriented x 4, grossly nonfocal Psychological:  Alert and cooperative. Normal mood and affect   Raizy Auzenne T. Russella Dar, MD 05/04/2023, 8:47 AM

## 2023-05-04 NOTE — Patient Instructions (Signed)
Your provider has requested that you go to the basement level for lab work before leaving today. Press "B" on the elevator. The lab is located at the first door on the left as you exit the elevator.  We have sent the following medications to your pharmacy for you to pick up at your convenience:  Phenergan  You have been scheduled for an endoscopy. Please follow written instructions given to you at your visit today. If you use inhalers (even only as needed), please bring them with you on the day of your procedure.  _______________________________________________________  If your blood pressure at your visit was 140/90 or greater, please contact your primary care physician to follow up on this.  _______________________________________________________  If you are age 51 or older, your body mass index should be between 23-30. Your Body mass index is 27.69 kg/m. If this is out of the aforementioned range listed, please consider follow up with your Primary Care Provider.  If you are age 32 or younger, your body mass index should be between 19-25. Your Body mass index is 27.69 kg/m. If this is out of the aformentioned range listed, please consider follow up with your Primary Care Provider.   ________________________________________________________  The [facility] providers would like to encourage you to use [facility] to communicate with providers for non-urgent requests or questions.  Due to long hold times on the telephone, sending your provider a message by [facility] may be a faster and more efficient way to get a response.  Please allow 48 business hours for a response.  Please remember that this is for non-urgent requests.  _______________________________________________________

## 2023-05-05 LAB — IGA: Immunoglobulin A: 155 mg/dL (ref 70–320)

## 2023-05-05 LAB — TISSUE TRANSGLUTAMINASE, IGA: (tTG) Ab, IgA: <1 U/mL

## 2023-05-06 DIAGNOSIS — H6123 Impacted cerumen, bilateral: Secondary | ICD-10-CM | POA: Diagnosis not present

## 2023-05-15 ENCOUNTER — Ambulatory Visit (AMBULATORY_SURGERY_CENTER): Payer: Medicare Other | Admitting: Gastroenterology

## 2023-05-15 ENCOUNTER — Encounter: Payer: Self-pay | Admitting: Gastroenterology

## 2023-05-15 VITALS — BP 131/67 | HR 50 | Temp 98.4°F | Resp 18 | Ht 70.0 in | Wt 193.0 lb

## 2023-05-15 DIAGNOSIS — K3189 Other diseases of stomach and duodenum: Secondary | ICD-10-CM

## 2023-05-15 DIAGNOSIS — K2970 Gastritis, unspecified, without bleeding: Secondary | ICD-10-CM

## 2023-05-15 DIAGNOSIS — R110 Nausea: Secondary | ICD-10-CM

## 2023-05-15 DIAGNOSIS — K259 Gastric ulcer, unspecified as acute or chronic, without hemorrhage or perforation: Secondary | ICD-10-CM | POA: Diagnosis not present

## 2023-05-15 MED ORDER — SODIUM CHLORIDE 0.9 % IV SOLN: 500.0000 mL | Freq: Once | INTRAVENOUS | Status: DC

## 2023-05-15 NOTE — Progress Notes (Signed)
Called to room to assist during endoscopic procedure.  Patient ID and intended procedure confirmed with present staff. Received instructions for my participation in the procedure from the performing physician.  

## 2023-05-15 NOTE — Progress Notes (Signed)
VS completed by DT.  Pt's states no medical or surgical changes since previsit or office visit.  

## 2023-05-15 NOTE — Progress Notes (Signed)
A and O x3. Report to RN. Tolerated MAC anesthesia well.Teeth unchanged after procedure. 

## 2023-05-15 NOTE — Patient Instructions (Addendum)
-   Resume previous diet. - Continue present medications. - Resume pantoprazole 40 mg po qd. - Await pathology results. - Avoid or minimize aspirin, ibuprofen, naproxen, or other non-steroidal anti-inflammatory drugs. - Follow up with PCP. GI follow up prn.  YOU HAD AN ENDOSCOPIC PROCEDURE TODAY AT THE [facility]:   Refer to the procedure report that was given to you for any specific questions about what was found during the examination.  If the procedure report does not answer your questions, please call your gastroenterologist to clarify.  If you requested that your care partner not be given the details of your procedure findings, then the procedure report has been included in a sealed envelope for you to review at your convenience later.  YOU SHOULD EXPECT: Some feelings of bloating in the abdomen. Passage of more gas than usual.  Walking can help get rid of the air that was put into your GI tract during the procedure and reduce the bloating. If you had a lower endoscopy (such as a colonoscopy or flexible sigmoidoscopy) you may notice spotting of blood in your stool or on the toilet paper. If you underwent a bowel prep for your procedure, you may not have a normal bowel movement for a few days.  Please Note:  You might notice some irritation and congestion in your nose or some drainage.  This is from the oxygen used during your procedure.  There is no need for concern and it should clear up in a day or so.  SYMPTOMS TO REPORT IMMEDIATELY:  Following upper endoscopy (EGD)  Vomiting of blood or coffee ground material  New chest pain or pain under the shoulder blades  Painful or persistently difficult swallowing  New shortness of breath  Fever of 100F or higher  Black, tarry-looking stools  For urgent or emergent issues, a gastroenterologist can be reached at any hour by calling [phone]. Do not use MyChart messaging for urgent concerns.    DIET:  We do recommend a small  meal at first, but then you may proceed to your regular diet.  Drink plenty of fluids but you should avoid alcoholic beverages for 24 hours.  ACTIVITY:  You should plan to take it easy for the rest of today and you should NOT DRIVE or use heavy machinery until tomorrow (because of the sedation medicines used during the test).    FOLLOW UP: Our staff will call the number listed on your records the next business day following your procedure.  We will call around 7:15- 8:00 am to check on you and address any questions or concerns that you may have regarding the information given to you following your procedure. If we do not reach you, we will leave a message.     If any biopsies were taken you will be contacted by phone or by letter within the next 1-3 weeks.  Please call us at [phone] if you have not heard about the biopsies in 3 weeks.    SIGNATURES/CONFIDENTIALITY: You and/or your care partner have signed paperwork which will be entered into your electronic medical record.  These signatures attest to the fact that that the information above on your After Visit Summary has been reviewed and is understood.  Full responsibility of the confidentiality of this discharge information lies with you and/or your care-partner.

## 2023-05-15 NOTE — Progress Notes (Signed)
See 05/04/2023 H&P, no changes

## 2023-05-15 NOTE — Op Note (Signed)
[facility] Patient Name: Robert Krause Procedure Date: 05/15/2023 9:36 AM MRN: [id number] Endoscopist: Meryl Dare , MD, [phone] Age: 89 Referring MD:  Date of Birth: 1934-08-29 Gender: Male Account #: [id number] Procedure:                Upper GI endoscopy Indications:              Nausea with intermittent vomiting Medicines:                Monitored Anesthesia Care Procedure:                Pre-Anesthesia Assessment:                           - Prior to the procedure, a History and Physical                            was performed, and patient medications and                            allergies were reviewed. The patient's tolerance of                            previous anesthesia was also reviewed. The risks                            and benefits of the procedure and the sedation                            options and risks were discussed with the patient.                            All questions were answered, and informed consent                            was obtained. Prior Anticoagulants: The patient has                            taken no anticoagulant or antiplatelet agents. ASA                            Grade Assessment: II - A patient with mild systemic                            disease. After reviewing the risks and benefits,                            the patient was deemed in satisfactory condition to                            undergo the procedure.                           After obtaining informed consent, the endoscope was  passed under direct vision. Throughout the                            procedure, the patient's blood pressure, pulse, and                            oxygen saturations were monitored continuously. The                            Olympus Scope [id number] was introduced through the                            mouth, and advanced to the second part of duodenum.                            The upper GI  endoscopy was accomplished without                            difficulty. The patient tolerated the procedure                            well. Scope In: Scope Out: Findings:                 The examined esophagus was normal.                           A few localized medium erosions with no bleeding                            and no stigmata of recent bleeding were found in                            the gastric fundus and on the greater curvature of                            the stomach. Biopsies were taken with a cold                            forceps for histology.                           The exam of the stomach was otherwise normal.                           The duodenal bulb and second portion of the                            duodenum were normal. Complications:            No immediate complications. Estimated Blood Loss:     Estimated blood loss was minimal. Impression:               - Normal esophagus.                           -  Erosive gastropathy with no bleeding and no                            stigmata of recent bleeding. Biopsied.                           - Normal duodenal bulb and second portion of the                            duodenum. Recommendation:           - Patient has a contact number available for                            emergencies. The signs and symptoms of potential                            delayed complications were discussed with the                            patient. Return to normal activities tomorrow.                            Written discharge instructions were provided to the                            patient.                           - Resume previous diet.                           - Continue present medications.                           - Resume pantoprazole 40 mg po qd.                           - Await pathology results.                           - Avoid or minimize aspirin, ibuprofen, naproxen,                            or  other non-steroidal anti-inflammatory drugs.                           - Follow up with PCP. GI follow up prn. Meryl Dare, MD 05/15/2023 9:58:49 AM This report has been signed electronically.

## 2023-05-19 ENCOUNTER — Telehealth: Payer: Self-pay | Admitting: *Deleted

## 2023-05-19 NOTE — Telephone Encounter (Signed)
  Follow up Call-     05/15/2023    8:57 AM  Call back number  Post procedure Call Back phone  # [phone]  Permission to leave phone message Yes     Patient questions:  Do you have a fever, pain , or abdominal swelling? No. Pain Score  0 *  Have you tolerated food without any problems? Yes.    Have you been able to return to your normal activities? Yes.    Do you have any questions about your discharge instructions: Diet   No. Medications  No. Follow up visit  No.  Do you have questions or concerns about your Care? No.  Actions: * If pain score is 4 or above: No action needed, pain <4.

## 2023-05-31 ENCOUNTER — Encounter: Payer: Self-pay | Admitting: Gastroenterology

## 2023-06-08 ENCOUNTER — Other Ambulatory Visit: Payer: Self-pay

## 2023-06-09 ENCOUNTER — Ambulatory Visit: Payer: Medicare Other | Admitting: Internal Medicine

## 2023-06-09 ENCOUNTER — Encounter: Payer: Self-pay | Admitting: Internal Medicine

## 2023-06-09 VITALS — BP 132/70 | HR 58 | Temp 97.8°F | Resp 16 | Ht 70.0 in | Wt 195.4 lb

## 2023-06-09 DIAGNOSIS — G4700 Insomnia, unspecified: Secondary | ICD-10-CM | POA: Diagnosis not present

## 2023-06-09 DIAGNOSIS — I10 Essential (primary) hypertension: Secondary | ICD-10-CM

## 2023-06-09 DIAGNOSIS — F419 Anxiety disorder, unspecified: Secondary | ICD-10-CM | POA: Diagnosis not present

## 2023-06-09 MED ORDER — ZOLPIDEM TARTRATE 5 MG PO TABS: 5.0000 mg | ORAL_TABLET | Freq: Every evening | ORAL | 0 refills | Status: DC | PRN

## 2023-06-09 NOTE — Progress Notes (Unsigned)
Subjective:    Patient ID: Robert Krause, male    DOB: January 30, 1934, 89 y.o.   MRN: [id number]  DOS:  06/09/2023 Type of visit - description: Routine follow-up  In general feeling well. Continue with chronic nausea and constipation, saw GI, chart reviewed.  Stress has increased, his wife had an accident and he is taking more responsibilities at home. That has created difficulty sleeping which is chronic but slightly worse lately. Also feels more fatigued than usual but denies chest pain, SOB, DOE, edema or palpitations.   Review of Systems See above   Past Medical History:  Diagnosis Date   Abdominal pain, right upper quadrant 04/24/2016   ACTINIC KERATOSIS, CHEEK, LEFT 08/28/2009   Qualifier: Diagnosis of  By: Lovell Sheehan MD, John E    Adenomatous polyp of colon 10/2005   ALLERGIC RHINITIS 09/28/2007   Qualifier: Diagnosis of  By: Lovell Sheehan MD, Balinda Quails    Annual physical exam >>>>>>>>>>>>>>>>>>> 07/18/2014   Anxiety    Anxiety and depression 05/29/2019   BPH (benign prostatic hyperplasia)    Carpal tunnel syndrome, left upper limb 01/01/2021   Carpal tunnel syndrome, right upper limb 01/01/2021   Chronic interstitial cystitis    Dr Logan Bores   Chronic nausea 11/12/2020   Colon polyp 04/14/2011   Contracture of palmar fascia 10/16/2010   Qualifier: Diagnosis of  By: Lovell Sheehan MD, John E    COVID 05/2022   mild   CTS (carpal tunnel syndrome) 01/27/2017   Essential hypertension 09/28/2007   Qualifier: Diagnosis of  By: Lovell Sheehan MD, Balinda Quails    FINGER SUP FB W/O MAJ OPEN WOUND&W/O MENTION INF 02/11/2008   Qualifier: Diagnosis of  By: Lovell Sheehan MD, Balinda Quails    GERD (gastroesophageal reflux disease)    Glaucoma    Hyperlipidemia    Hypertension    Other chronic sinusitis 09/28/2007   Qualifier: Diagnosis of  By: Lovell Sheehan MD, Balinda Quails    PCP NOTES >>>>>> 11/08/2015   PROSTATITIS, ACUTE, CHRONIC 03/26/2009   Qualifier: Diagnosis of  By: Lovell Sheehan MD, John E    Rib pain on left side  08/13/2022   SCC (squamous cell carcinoma)    pt denies   Venous lake    right inferior clavicle, shave    Past Surgical History:  Procedure Laterality Date   APPENDECTOMY     CARPAL TUNNEL RELEASE Right 12/23/2022   Procedure: RIGHT CARPAL TUNNEL RELEASE;  Surgeon: London Sheer, MD;  Location: [facility] OR;  Service: Orthopedics;  Laterality: Right;   CATARACT EXTRACTION Right 06-2015   COLONOSCOPY     POLYPECTOMY     TRANSURETHRAL RESECTION OF PROSTATE     UPPER GASTROINTESTINAL ENDOSCOPY      Current Outpatient Medications  Medication Instructions   acetaminophen (TYLENOL) 500 mg, Oral, Every 6 hours PRN   ALPRAZolam (XANAX) 0.25-0.5 mg, Oral, At bedtime PRN   irbesartan-hydrochlorothiazide (AVALIDE) 150-12.5 MG tablet 1 tablet, Oral, Daily   latanoprost (XALATAN) 0.005 % ophthalmic solution 1 drop, Both Eyes, Daily at bedtime   pantoprazole (PROTONIX) 40 mg, Oral, Daily   pravastatin (PRAVACHOL) 10 mg, Oral, Daily at bedtime   promethazine (PHENERGAN) 12.5 mg, Oral, 2 times daily PRN   tamsulosin (FLOMAX) 0.4 mg, Oral, Daily at bedtime       Objective:   Physical Exam BP 132/70   Pulse (!) 58   Temp 97.8 F (36.6 C) (Oral)   Resp 16   Ht 5\' 10"  (1.778 m)   Wt 195 lb 6  oz (88.6 kg)   SpO2 96%   BMI 28.03 kg/m  General:   Well developed, NAD, BMI noted. HEENT:  Normocephalic . Face symmetric, atraumatic Lungs:  CTA B Normal respiratory effort, no intercostal retractions, no accessory muscle use. Heart: RRR,  no murmur.  Lower extremities: no pretibial edema bilaterally  Skin: Not pale. Not jaundice Neurologic:  alert & oriented X3.  Speech normal, gait appropriate for age and unassisted Psych--  Cognition and judgment appear intact.  Cooperative with normal attention span and concentration.  Behavior appropriate. No anxious or depressed appearing.      Assessment     Assessment Prediabetes HTN CV: --Bradycardia: Holter 2016 essentially  negative --06/07/2019 dx was  DOE and palpitations: ECHO unremarkable, nuclear stress test EF 45%, no ischemic changes Hyperlipidemia Anxiety: on xanax, stress related to interstitial cystitis, lexapro intol (-2020) Insomnia  DJD  Glaucoma GU: Dr. Logan Bores --BPH, TURP --Interstitial cystitis --- xanax prn Venous lake, right inferior clavicle Chronic nausea-constipation: GB U/S 2017 (-),  saw GI, EGD 11-2018: Erosive gastritis, BX with no H. pylori, + reactive gastropathy.   CT abdomen 08-2019: Benign.  EGD 6/ 2024: Erosive gastropathy, BX reactive gastropathy.   PLAN HTN: BP is very good, recommend to check at home, continue Avalide.  Hyponatremia noted, probably from HCTZ.  Observation. Anxiety: Related to interstitial cystitis, although he picks up Xanax regularly from the pharmacy he tells me he takes it only 3-4 times a month.  Reassess on RTC, previously intolerant to Lexapro, we could try a different medication.  BuSpar?. Insomnia: He thinks related to stress, good habits discussed.  He requests help, recommend low-dose Ambien.  He takes Xanax very seldom.   Chronic nausea, chronic constipation: Saw GI 05/04/2023, celiac studies negative, extensive blood work negative, EGD done 05/15/2023, Erosive gastropathy, BX: Reactive gastropathy, no H. pylori, dysplasia or carcinoma He complained about chronic constipation encouraged to take MiraLAX daily for a while then as needed RTC 3 months  1==== Here for CPX Prediabetes: Unable to exercise due to weather, recommend to go back to his regular walks once is safe to do so.  Check A1c [id number] well-controlled on Avalide.  Checking labs. Saw cardiology 08-2022, they emphasize primary prevention.  Next follow-up 1 year High cholesterol: On Pravachol, checking labs. Anxiety: Hardly ever takes Xanax. Chronic nausea: Saw GI few days ago, Dx recurrent nausea, weight loss noted as well.  Also c/o  constipation was recommended MOM.  They  recommended a follow-up in 6 weeks. Interstitial cystitis: On gabapentin, sees urology. RTC 6 months

## 2023-06-09 NOTE — Patient Instructions (Addendum)
For difficulty sleeping:  You can take Ambien 5 mg before bedtime or if you wake up in the middle of the night.  Watch for side effects and for excessive somnolence.  Please read the following advice about healthy sleep:  Sleep hygiene: Basic rules for a good night's sleep  Sleep only as much as you need to feel rested and then get out of bed  Keep a regular sleep schedule  Avoid forcing sleep  Exercise regularly for at least 20 minutes, preferably 4 to 5 hours before bedtime  Avoid caffeinated beverages after lunch  Avoid alcohol near bedtime: no "night cap"  Avoid smoking, especially in the evening  Do not go to bed hungry  Adjust bedroom environment  Avoid prolonged use of light-emitting screens before bedtime   Deal with your worries before bedtime   For constipation: MiraLAX daily, 17 g with lots of water.  Check the  blood pressure regularly BP GOAL is between 110/65 and  135/85. If it is consistently higher or lower, let me know      GO TO THE FRONT DESK, PLEASE SCHEDULE YOUR APPOINTMENTS Come back for a checkup in 3 months   Vaccines I recommend: Covid booster RSV vaccine Flu shot this fall

## 2023-06-10 DIAGNOSIS — G4700 Insomnia, unspecified: Secondary | ICD-10-CM

## 2023-06-10 HISTORY — DX: Insomnia, unspecified: G47.00

## 2023-06-10 NOTE — Assessment & Plan Note (Signed)
HTN: BP is very good, recommend to check at home, continue Avalide.  Hyponatremia noted, probably from HCTZ.  Observation. Anxiety: Related to interstitial cystitis exacerbations-sxs, although he picks up Xanax regularly from the pharmacy he tells me he takes it only 3-4 times a month.  Reassess on RTC, previously intolerant to Lexapro, we could try a different medication.  BuSpar?. Insomnia: He thinks related to stress, good habits discussed.  He requests help, recommend low-dose Ambien.  He takes Xanax very seldom. Chronic nausea, chronic constipation: Saw GI 05/04/2023, celiac studies negative, extensive blood work negative, EGD done 05/15/2023, Erosive gastropathy, BX: Reactive gastropathy, no H. pylori, dysplasia or carcinoma He complained about chronic constipation encouraged to take MiraLAX daily for a while then as needed RTC 3 months

## 2023-07-28 DIAGNOSIS — C44622 Squamous cell carcinoma of skin of right upper limb, including shoulder: Secondary | ICD-10-CM | POA: Diagnosis not present

## 2023-07-28 DIAGNOSIS — S50811A Abrasion of right forearm, initial encounter: Secondary | ICD-10-CM | POA: Diagnosis not present

## 2023-08-04 ENCOUNTER — Other Ambulatory Visit: Payer: Self-pay

## 2023-08-04 DIAGNOSIS — K219 Gastro-esophageal reflux disease without esophagitis: Secondary | ICD-10-CM | POA: Insufficient documentation

## 2023-08-06 ENCOUNTER — Ambulatory Visit: Payer: Medicare Other | Attending: Cardiology | Admitting: Cardiology

## 2023-08-06 ENCOUNTER — Encounter: Payer: Self-pay | Admitting: Cardiology

## 2023-08-06 VITALS — BP 120/60 | HR 64 | Ht 70.6 in | Wt 194.1 lb

## 2023-08-06 DIAGNOSIS — R0609 Other forms of dyspnea: Secondary | ICD-10-CM | POA: Diagnosis not present

## 2023-08-06 DIAGNOSIS — E782 Mixed hyperlipidemia: Secondary | ICD-10-CM

## 2023-08-06 DIAGNOSIS — I10 Essential (primary) hypertension: Secondary | ICD-10-CM | POA: Diagnosis not present

## 2023-08-06 HISTORY — DX: Other forms of dyspnea: R06.09

## 2023-08-06 NOTE — Patient Instructions (Signed)
Medication Instructions:  Your physician recommends that you continue on your current medications as directed. Please refer to the Current Medication list given to you today.   *If you need a refill on your cardiac medications before your next appointment, please call your pharmacy*   Lab Work: Your physician recommends that you have a BMP today in the office.   If you have labs (blood work) drawn today and your tests are completely normal, you will receive your results only by: MyChart Message (if you have MyChart) OR A paper copy in the mail If you have any lab test that is abnormal or we need to change your treatment, we will call you to review the results.   Testing/Procedures:   Your cardiac CT will be scheduled at one of the below locations:   [facility] [address] [place], [place] [address] [phone]  If scheduled at [facility], please arrive at the [facility] and Children's Entrance (Entrance C2) of [facility] 30 minutes prior to test start time. You can use the FREE valet parking offered at entrance C (encouraged to control the heart rate for the test)  Proceed to the [facility] Radiology Department (first floor) to check-in and test prep.  All radiology patients and guests should use entrance C2 at [facility], accessed from [facility], even though the hospital's physical address listed is [address].     Please follow these instructions carefully (unless otherwise directed):  Hold all erectile dysfunction medications at least 3 days (72 hrs) prior to test. (Ie viagra, cialis, sildenafil, tadalafil, etc) We will administer nitroglycerin during this exam.   On the Night Before the Test: Be sure to Drink plenty of water. Do not consume any caffeinated/decaffeinated beverages or chocolate 12 hours prior to your test. Do not take any antihistamines 12 hours prior to your test.   On the Day of the  Test: Drink plenty of water until 1 hour prior to the test. Do not eat any food 1 hour prior to test. You may take your regular medications prior to the test.       After the Test: Drink plenty of water. After receiving IV contrast, you may experience a mild flushed feeling. This is normal. On occasion, you may experience a mild rash up to 24 hours after the test. This is not dangerous. If this occurs, you can take Benadryl 25 mg and increase your fluid intake. If you experience trouble breathing, this can be serious. If it is severe call 911 IMMEDIATELY. If it is mild, please call our office. If you take any of these medications: Glipizide/Metformin, Avandament, Glucavance, please do not take 48 hours after completing test unless otherwise instructed.  We will call to schedule your test 2-4 weeks out understanding that some insurance companies will need an authorization prior to the service being performed.   For non-scheduling related questions, please contact the cardiac imaging nurse navigator should you have any questions/concerns: Rockwell Alexandria, Cardiac Imaging Nurse Navigator Larey Brick, Cardiac Imaging Nurse Navigator [facility] Direct Office Dial: [phone]   For scheduling needs, including cancellations and rescheduling, please call [place], [phone].   Your physician has requested that you have an echocardiogram. Echocardiography is a painless test that uses sound waves to create images of your heart. It provides your doctor with information about the size and shape of your heart and how well your heart's chambers and valves are  working. This procedure takes approximately one hour. There are no restrictions for this procedure. Please do NOT wear cologne, perfume, aftershave, or lotions (deodorant is allowed). Please arrive 15 minutes prior to your appointment time.  Your next appointment:   6 month(s)  The format for your next  appointment:   In Person  Provider:   Belva Crome, MD   Other Instructions Cardiac CT Angiogram A cardiac CT angiogram is a procedure to look at the heart and the area around the heart. It may be done to help find the cause of chest pains or other symptoms of heart disease. During this procedure, a substance called contrast dye is injected into the blood vessels in the area to be checked. A large X-ray machine, called a CT scanner, then takes detailed pictures of the heart and the surrounding area. The procedure is also sometimes called a coronary CT angiogram, coronary artery scanning, or CTA. A cardiac CT angiogram allows the health care provider to see how well blood is flowing to and from the heart. The health care provider will be able to see if there are any problems, such as: Blockage or narrowing of the coronary arteries in the heart. Fluid around the heart. Signs of weakness or disease in the muscles, valves, and tissues of the heart. Tell a health care provider about: Any allergies you have. This is especially important if you have had a previous allergic reaction to contrast dye. All medicines you are taking, including vitamins, herbs, eye drops, creams, and over-the-counter medicines. Any blood disorders you have. Any surgeries you have had. Any medical conditions you have. Whether you are pregnant or may be pregnant. Any anxiety disorders, chronic pain, or other conditions you have that may increase your stress or prevent you from lying still. What are the risks? Generally, this is a safe procedure. However, problems may occur, including: Bleeding. Infection. Allergic reactions to medicines or dyes. Damage to other structures or organs. Kidney damage from the contrast dye that is used. Increased risk of cancer from radiation exposure. This risk is low. Talk with your health care provider about: The risks and benefits of testing. How you can receive the lowest dose of  radiation. What happens before the procedure? Wear comfortable clothing and remove any jewelry, glasses, dentures, and hearing aids. Follow instructions from your health care provider about eating and drinking. This may include: For 12 hours before the procedure -- avoid caffeine. This includes tea, coffee, soda, energy drinks, and diet pills. Drink plenty of water or other fluids that do not have caffeine in them. Being well hydrated can prevent complications. For 4-6 hours before the procedure -- stop eating and drinking. The contrast dye can cause nausea, but this is less likely if your stomach is empty. Ask your health care provider about changing or stopping your regular medicines. This is especially important if you are taking diabetes medicines, blood thinners, or medicines to treat problems with erections (erectile dysfunction). What happens during the procedure?  Hair on your chest may need to be removed so that small sticky patches called electrodes can be placed on your chest. These will transmit information that helps to monitor your heart during the procedure. An IV will be inserted into one of your veins. You might be given a medicine to control your heart rate during the procedure. This will help to ensure that good images are obtained. You will be asked to lie on an exam table. This table will slide in and  out of the CT machine during the procedure. Contrast dye will be injected into the IV. You might feel warm, or you may get a metallic taste in your mouth. You will be given a medicine called nitroglycerin. This will relax or dilate the arteries in your heart. The table that you are lying on will move into the CT machine tunnel for the scan. The person running the machine will give you instructions while the scans are being done. You may be asked to: Keep your arms above your head. Hold your breath. Stay very still, even if the table is moving. When the scanning is complete, you  will be moved out of the machine. The IV will be removed. The procedure may vary among health care providers and hospitals. What can I expect after the procedure? After your procedure, it is common to have: A metallic taste in your mouth from the contrast dye. A feeling of warmth. A headache from the nitroglycerin. Follow these instructions at home: Take over-the-counter and prescription medicines only as told by your health care provider. If you are told, drink enough fluid to keep your urine pale yellow. This will help to flush the contrast dye out of your body. Most people can return to their normal activities right after the procedure. Ask your health care provider what activities are safe for you. It is up to you to get the results of your procedure. Ask your health care provider, or the department that is doing the procedure, when your results will be ready. Keep all follow-up visits as told by your health care provider. This is important. Contact a health care provider if: You have any symptoms of allergy to the contrast dye. These include: Shortness of breath. Rash or hives. A racing heartbeat. Summary A cardiac CT angiogram is a procedure to look at the heart and the area around the heart. It may be done to help find the cause of chest pains or other symptoms of heart disease. During this procedure, a large X-ray machine, called a CT scanner, takes detailed pictures of the heart and the surrounding area after a contrast dye has been injected into blood vessels in the area. Ask your health care provider about changing or stopping your regular medicines before the procedure. This is especially important if you are taking diabetes medicines, blood thinners, or medicines to treat erectile dysfunction. If you are told, drink enough fluid to keep your urine pale yellow. This will help to flush the contrast dye out of your body. This information is not intended to replace advice given to  you by your health care provider. Make sure you discuss any questions you have with your health care provider. Document Revised: 07/06/2019 Document Reviewed: 07/06/2019 Elsevier Patient Education  [employer].  Echocardiogram An echocardiogram is a test that uses sound waves to make images of your heart. This way of making images is often called ultrasound. The images from this test can help find out many things about your heart, including: The size and shape of your heart. The strength of your heart muscle and how well it's working. The size, thickness, and movement of your heart's walls. How your heart valves are working. Problems such as: A tumor or a growth from an infection around the heart valves. Areas of heart muscle that aren't working well because of poor blood flow or injury from a heart attack. An aneurysm. This is a weak or damaged part of an artery wall. An artery is  a blood vessel. Tell a health care provider about: Any allergies you have. All medicines you're taking, including vitamins, herbs, eye drops, creams, and over-the-counter medicines. Any bleeding problems you have. Any surgeries you've had. Any medical problems you have. Whether you're pregnant or may be pregnant. What are the risks? Your health care provider will talk with you about risks. These may include an allergic reaction to IV dye that may be used during the test. What happens before the test? You don't need to do anything to get ready for this test. You may eat and drink normally. What happens during the test?  You'll take off your clothes from the waist up and put on a hospital gown. Sticky patches called electrodes may be placed on your chest. These will be connected to a machine that monitors your heart rate and rhythm. You'll lie down on a table for the exam. A wand covered in gel will be moved over your chest. Sound waves from the wand will go to your heart and bounce back--or "echo"  back. The sound waves will go to a computer that uses them to make images of your heart. The images can be viewed on a monitor. The images will also be recorded on the computer so your provider can look at them later. You may be asked to change positions or hold your breath for a short time. This makes it easier to get different views or better views of your heart. In some cases, you may be given a dye through an IV. The IV is put into one of your veins. This dye can make the areas of your heart easier to see. The procedure may vary among providers and hospitals. What can I expect after the test? You may return to your normal diet, activities, and medicines unless your provider tells you not to. If an IV was placed for the test, it will be removed. It's up to you to get the results of your test. Ask your provider, or the department that's doing the test, when your results will be ready. This information is not intended to replace advice given to you by your health care provider. Make sure you discuss any questions you have with your health care provider. Document Revised: 01/09/2023 Document Reviewed: 01/09/2023 Elsevier Patient Education  2024 [employer].

## 2023-08-06 NOTE — Progress Notes (Signed)
Cardiology Office Note:    Date:  08/06/2023   ID:  Robert Krause, DOB [birth year], MRN [id number]  PCP:  Wanda Plump, MD  Cardiologist:  Garwin Brothers, MD   Referring MD: Wanda Plump, MD    ASSESSMENT:    1. Mixed hyperlipidemia   2. Essential hypertension   3. DOE (dyspnea on exertion)    PLAN:    In order of problems listed above:  Primary prevention stressed with the patient.  Importance of compliance with diet medication stressed and patient verbalized standing. Essential hypertension: Blood pressure stable and diet was emphasized.  Lifestyle modification urged Mixed dyslipidemia: On lipid-lowering medications followed by primary care.  Lipids reviewed.  Diet emphasized. Dyspnea on exertion: I discussed this with the patient at length.  This could be a symptom of ischemia and therefore we will do a CT FFR and he is agreeable. Cardiac murmur: Echocardiogram will be done to assess murmur heard on auscultation. Patient will be seen in follow-up appointment in 6 months or earlier if the patient has any concerns.    Medication Adjustments/Labs and Tests Ordered: Current medicines are reviewed at length with the patient today.  Concerns regarding medicines are outlined above.  Orders Placed This Encounter  Procedures   EKG 12-Lead   No orders of the defined types were placed in this encounter.    No chief complaint on file.    History of Present Illness:    Robert Krause is a 89 y.o. male.  Patient has past medical history of essential hypertension and dyslipidemia.  He has been noticing dyspnea on exertion more than usual.  No chest pain orthopnea or PND.  This is concerning.  He wants to be evaluated for this.  At the time of my evaluation, the patient is alert awake oriented and in no distress.  Past Medical History:  Diagnosis Date   ACTINIC KERATOSIS, CHEEK, LEFT 08/28/2009   Qualifier: Diagnosis of  By: Lovell Sheehan MD, John E    Adenomatous polyp of  colon 10/2005   ALLERGIC RHINITIS 09/28/2007   Qualifier: Diagnosis of  By: Lovell Sheehan MD, Balinda Quails    Annual physical exam >>>>>>>>>>>>>>>>>>> 07/18/2014   Anxiety    BPH (benign prostatic hyperplasia)    Carpal tunnel syndrome, left upper limb 01/01/2021   Chronic interstitial cystitis    Dr Logan Bores   Chronic nausea 11/12/2020   Colon polyp 04/14/2011   Contracture of palmar fascia 10/16/2010   Qualifier: Diagnosis of  By: Lovell Sheehan MD, John E    COVID 05/2022   mild   CTS (carpal tunnel syndrome) 01/27/2017   Essential hypertension 09/28/2007   Qualifier: Diagnosis of  By: Lovell Sheehan MD, Balinda Quails    FINGER SUP FB W/O MAJ OPEN WOUND&W/O MENTION INF 02/11/2008   Qualifier: Diagnosis of  By: Lovell Sheehan MD, Balinda Quails    GERD (gastroesophageal reflux disease)    Glaucoma    Hyperlipidemia    Insomnia 06/10/2023   Other chronic sinusitis 09/28/2007   Qualifier: Diagnosis of  By: Lovell Sheehan MD, Balinda Quails    PCP NOTES >>>>>> 11/08/2015   PROSTATITIS, ACUTE, CHRONIC 03/26/2009   Qualifier: Diagnosis of  By: Lovell Sheehan MD, John E    Rib pain on left side 08/13/2022   SCC (squamous cell carcinoma)    pt denies   Venous [place]    right inferior clavicle, shave    Past Surgical History:  Procedure Laterality Date   APPENDECTOMY     CARPAL TUNNEL  RELEASE Right 12/23/2022   Procedure: RIGHT CARPAL TUNNEL RELEASE;  Surgeon: London Sheer, MD;  Location: [facility] OR;  Service: Orthopedics;  Laterality: Right;   CATARACT EXTRACTION Right 06-2015   COLONOSCOPY     POLYPECTOMY     TRANSURETHRAL RESECTION OF PROSTATE     UPPER GASTROINTESTINAL ENDOSCOPY      Current Medications: Current Meds  Medication Sig   acetaminophen (TYLENOL) 500 MG tablet Take 1 tablet (500 mg total) by mouth every 6 (six) hours as needed for moderate pain or mild pain.   ALPRAZolam (XANAX) 0.25 MG tablet Take 0.25-0.5 mg by mouth at bedtime as needed for anxiety.   irbesartan-hydrochlorothiazide (AVALIDE) 150-12.5 MG tablet Take 1 tablet by  mouth daily.   latanoprost (XALATAN) 0.005 % ophthalmic solution Place 1 drop into both eyes at bedtime.   pravastatin (PRAVACHOL) 10 MG tablet Take 1 tablet (10 mg total) by mouth at bedtime.   Tamsulosin HCl (FLOMAX) 0.4 MG CAPS Take 0.4 mg by mouth at bedtime.   zolpidem (AMBIEN) 5 MG tablet Take 1 tablet (5 mg total) by mouth at bedtime as needed for sleep.     Allergies:   Patient has no known allergies.   Social History   Socioeconomic History   Marital status: Married    Spouse name: Not on file   Number of children: 1   Years of education: Not on file   Highest education level: Not on file  Occupational History   Occupation: retired- Sales executive: RETIRED  Tobacco Use   Smoking status: Never    Passive exposure: Never   Smokeless tobacco: Never  Vaping Use   Vaping status: Never Used  Substance and Sexual Activity   Alcohol use: Yes    Alcohol/week: 3.0 standard drinks of alcohol    Types: 3 Glasses of wine per week    Comment: wine   Drug use: No   Sexual activity: Not on file  Other Topics Concern   Not on file  Social History Narrative   Born in [place], raised in [place], moved to [place] in the 60s   Household: pt and wife   Son lives in the [place]    Social Determinants of Health   Financial Resource Strain: Low Risk  (08/24/2020)   Overall Financial Resource Strain (CARDIA)    Difficulty of Paying Living Expenses: Not very hard  Food Insecurity: Low Risk  (05/06/2023)   Received from [facility]   Hunger Vital Sign    Worried About Running Out of Food in the Last Year: Never true    Ran Out of Food in the Last Year: Never true  Transportation Needs: Not on file (05/06/2023)  Physical Activity: Not on file  Stress: Not on file  Social Connections: Not on file     Family History: The patient's family history includes Anxiety disorder in an other family member; CAD in his father; Hyperlipidemia in his father; Hypertension in his father. There is no  history of Colon cancer, Prostate cancer, Esophageal cancer, Rectal cancer, or Stomach cancer.  ROS:   Please see the history of present illness.    All other systems reviewed and are negative.  EKGs/Labs/Other Studies Reviewed:    The following studies were reviewed today: I discussed my findings with the patient at length.  ..EKG Interpretation Date/Time:  Thursday August 06 2023 14:42:01 EDT Ventricular Rate:  64 PR Interval:  214 QRS Duration:  94 QT Interval:  386 QTC Calculation:  398 R Axis:   -32  Text Interpretation: Sinus rhythm with 1st degree A-V block Left axis deviation When compared with ECG of 27-Jan-2022 20:00, Inverted T waves have replaced nonspecific T wave abnormality in Inferior leads Confirmed by Belva Crome [phone]) on 08/06/2023 3:08:28 PM     Recent Labs: 05/04/2023: ALT 13; BUN 14; Creatinine, Ser 0.95; Hemoglobin 14.9; Platelets 197.0; Potassium 4.2; Sodium 134; TSH 2.06  Recent Lipid Panel    Component Value Date/Time   CHOL 146 12/09/2022 0840   TRIG 70.0 12/09/2022 0840   TRIG 55 10/05/2006 1439   HDL 53.20 12/09/2022 0840   CHOLHDL 3 12/09/2022 0840   VLDL 14.0 12/09/2022 0840   LDLCALC 79 12/09/2022 0840   LDLDIRECT 153.4 06/06/2013 1156    Physical Exam:    VS:  BP 120/60   Pulse 64   Ht 5' 10.6" (1.793 m)   Wt 194 lb 1.3 oz (88 kg)   SpO2 96%   BMI 27.38 kg/m     Wt Readings from Last 3 Encounters:  08/06/23 194 lb 1.3 oz (88 kg)  06/09/23 195 lb 6 oz (88.6 kg)  05/15/23 193 lb (87.5 kg)     GEN: Patient is in no acute distress HEENT: Normal NECK: No JVD; No carotid bruits LYMPHATICS: No lymphadenopathy CARDIAC: Hear sounds regular, 2/6 systolic murmur at the apex. RESPIRATORY:  Clear to auscultation without rales, wheezing or rhonchi  ABDOMEN: Soft, non-tender, non-distended MUSCULOSKELETAL:  No edema; No deformity  SKIN: Warm and dry NEUROLOGIC:  Alert and oriented x 3 PSYCHIATRIC:  Normal affect    Signed, Garwin Brothers, MD  08/06/2023 3:28 PM    [facility]

## 2023-08-07 ENCOUNTER — Other Ambulatory Visit: Payer: Self-pay | Admitting: Internal Medicine

## 2023-08-07 LAB — BASIC METABOLIC PANEL
BUN/Creatinine Ratio: 22 (ref 10–24)
BUN: 23 mg/dL (ref 8–27)
CO2: 22 mmol/L (ref 20–29)
Calcium: 8.8 mg/dL (ref 8.6–10.2)
Chloride: 99 mmol/L (ref 96–106)
Creatinine, Ser: 1.06 mg/dL (ref 0.76–1.27)
Glucose: 99 mg/dL (ref 70–99)
Potassium: 4.2 mmol/L (ref 3.5–5.2)
Sodium: 136 mmol/L (ref 134–144)
eGFR: 68 mL/min/{1.73_m2} (ref >59)

## 2023-08-16 ENCOUNTER — Other Ambulatory Visit: Payer: Self-pay | Admitting: Internal Medicine

## 2023-08-24 ENCOUNTER — Other Ambulatory Visit (INDEPENDENT_AMBULATORY_CARE_PROVIDER_SITE_OTHER): Payer: Medicare Other

## 2023-08-24 ENCOUNTER — Ambulatory Visit: Payer: Medicare Other | Admitting: Orthopedic Surgery

## 2023-08-24 DIAGNOSIS — G5602 Carpal tunnel syndrome, left upper limb: Secondary | ICD-10-CM

## 2023-08-24 DIAGNOSIS — G5603 Carpal tunnel syndrome, bilateral upper limbs: Secondary | ICD-10-CM

## 2023-08-24 DIAGNOSIS — G5601 Carpal tunnel syndrome, right upper limb: Secondary | ICD-10-CM | POA: Diagnosis not present

## 2023-08-24 NOTE — Progress Notes (Signed)
Robert Krause - 89 y.o. male MRN [id number]  Date of birth: 1934/10/26  Office Visit Note: Visit Date: 08/24/2023 PCP: Wanda Plump, MD Referred by: Wanda Plump, MD  Subjective: No chief complaint on file.  HPI: Robert Krause is a pleasant 89 y.o. male who presents today for ongoing left hand numbness and tingling of the radial sided digits that has been present for multiple years.  He does have a history of right carpal tunnel syndrome that underwent carpal tunnel release by Dr. Christell Constant 2 years prior.  At that time, he did have bilateral EMG studies which showed severe carpal tunnel syndrome bilaterally however he decided to wait on the left side as this is his dominant hand.  Since then, his symptoms have worsened, numbness and tingling is present, nocturnal symptoms are quite severe.  Has trialed bracing and activity modification without lasting relief.  Pertinent ROS were reviewed with the patient and found to be negative unless otherwise specified above in HPI.   Visit Reason: bilateral hand  Duration of symptoms: years Hand dominance: left Occupation:years Diabetic: No Smoking: No Heart/Lung History: none Blood Thinners:   none  Prior Testing/EMG: EMG 08/06/21 Injections (Date): none Treatments: surgery on right Prior Surgery: 12/23/22 [facility]   Assessment & Plan: Visit Diagnoses:  1. Bilateral carpal tunnel syndrome     Plan: Extensive discussion was had with the patient today about his ongoing left sided carpal tunnel syndrome that is refractory to conservative care.  Patient has both clinical and electrodiagnostic evidence to confirm this diagnosis.  At this juncture, he is indicated for left open versus endoscopic carpal tunnel release.  Risks and benefits of both operations were discussed in detail today.  Understanding all risks and benefits, patient would like to have surgery done in the form of left open carpal tunnel release under local anesthesia.  Risks  include but not limited to infection, bleeding, scarring, stiffness, nerve injury or vascular, tendon injury, risk of recurrence and need for subsequent operation were all discussed in detail.  Patient consented understanding the above.  Will move forward with surgical scheduling.   Follow-up: No follow-ups on file.   Meds & Orders: No orders of the defined types were placed in this encounter.   Orders Placed This Encounter  Procedures   XR Wrist Complete Right   XR Wrist Complete Left     Procedures: No procedures performed      Clinical History: No specialty comments available.  He reports that he has never smoked. He has never been exposed to tobacco smoke. He has never used smokeless tobacco.  Recent Labs    12/09/22 0840  HGBA1C 5.5    Objective:   Vital Signs: There were no vitals taken for this visit.  Physical Exam  Gen: Well-appearing, in no acute distress; non-toxic CV: Regular Rate. Well-perfused. Warm.  Resp: Breathing unlabored on room air; no wheezing. Psych: Fluid speech in conversation; appropriate affect; normal thought process  Ortho Exam PHYSICAL EXAM:  General: Patient is well appearing and in no distress. Cervical spine mobility is full in all directions:  Skin and Muscle: Well-healed prior right palmar incision from carpal tunnel release.  No other skin changes are apparent to upper extremities.   Range of Motion and Palpation Tests: Mobility is full about the elbows with flexion and extension.Forearm supination and pronation are 85/85 bilaterally.  Wrist flexion/extension is 75/65 bilaterally.  Digital flexion and extension are full.  Thumb opposition is  full to the base of the small fingers bilaterally.    Neurologic, Vascular, Motor: Sensation is diminished to light touch in the left median distribution.  2-point discrimination is greater than 10 mm.   Tinel's testing positive left carpal tunnel Phalen's positive left, Derkan's compression  positive left Thenar atrophy: Mild left APB: 4/5 left side  Fingers pink and well perfused.  Capillary refill is brisk.     Lab Results  Component Value Date   HGBA1C 5.5 12/09/2022     Imaging: XR Wrist Complete Right  Result Date: 08/24/2023 X-rays of the right wrist demonstrate thumb CMC degenerative change with osteophyte formation and joint space narrowing.  There is calcification notable along the radial artery particular at the level of the wrist.  Radiocarpal and midcarpal articulations are preserved.    XR Wrist Complete Left  Result Date: 08/24/2023 X-rays of the left wrist demonstrate thumb CMC degenerative change with osteophyte formation and joint space narrowing.  There is calcification notable along the radial artery particular at the level of the wrist.  Radiocarpal and midcarpal articulations are preserved.     Past Medical/Family/Surgical/Social History: Medications & Allergies reviewed per EMR, new medications updated. Patient Active Problem List   Diagnosis Date Noted   DOE (dyspnea on exertion) 08/06/2023   GERD (gastroesophageal reflux disease)    Insomnia 06/10/2023   Rib pain on left side 08/13/2022   COVID 05/2022   SCC (squamous cell carcinoma) 10/30/2021   Anxiety    BPH (benign prostatic hyperplasia)    Glaucoma    Venous lake    Carpal tunnel syndrome, left upper limb 01/01/2021   Chronic nausea 11/12/2020   CTS (carpal tunnel syndrome)-- B , was rec surgery before 01/27/2017   PCP NOTES >>>>>> 11/08/2015   Annual physical exam >>>>>>>>>>>>>>>>>>> 07/18/2014   Colon polyp 04/14/2011   CONTRACTURE OF PALMAR FASCIA 10/16/2010   ACTINIC KERATOSIS, CHEEK, LEFT 08/28/2009   PROSTATITIS, ACUTE, CHRONIC 03/26/2009   FINGER SUP FB W/O MAJ OPEN WOUND&W/O MENTION INF 02/11/2008   Hyperlipidemia 09/28/2007   Essential hypertension 09/28/2007   OTHER CHRONIC SINUSITIS 09/28/2007   ALLERGIC RHINITIS 09/28/2007   CYSTITIS, CHRONIC INTERSTITIAL -- xanax  prn, Dr Logan Bores  09/28/2007   OSTEOARTHRITIS 09/28/2007   Adenomatous polyp of colon 10/2005   Past Medical History:  Diagnosis Date   ACTINIC KERATOSIS, CHEEK, LEFT 08/28/2009   Qualifier: Diagnosis of  By: Lovell Sheehan MD, John E    Adenomatous polyp of colon 10/2005   ALLERGIC RHINITIS 09/28/2007   Qualifier: Diagnosis of  By: Lovell Sheehan MD, Balinda Quails    Annual physical exam >>>>>>>>>>>>>>>>>>> 07/18/2014   Anxiety    BPH (benign prostatic hyperplasia)    Carpal tunnel syndrome, left upper limb 01/01/2021   Chronic interstitial cystitis    Dr Logan Bores   Chronic nausea 11/12/2020   Colon polyp 04/14/2011   Contracture of palmar fascia 10/16/2010   Qualifier: Diagnosis of  By: Lovell Sheehan MD, John E    COVID 05/2022   mild   CTS (carpal tunnel syndrome) 01/27/2017   Essential hypertension 09/28/2007   Qualifier: Diagnosis of  By: Lovell Sheehan MD, Balinda Quails    FINGER SUP FB W/O MAJ OPEN WOUND&W/O MENTION INF 02/11/2008   Qualifier: Diagnosis of  By: Lovell Sheehan MD, Balinda Quails    GERD (gastroesophageal reflux disease)    Glaucoma    Hyperlipidemia    Insomnia 06/10/2023   Other chronic sinusitis 09/28/2007   Qualifier: Diagnosis of  By: Lovell Sheehan MD, Balinda Quails  PCP NOTES >>>>>> 11/08/2015   PROSTATITIS, ACUTE, CHRONIC 03/26/2009   Qualifier: Diagnosis of  By: Lovell Sheehan MD, Balinda Quails    Rib pain on left side 08/13/2022   SCC (squamous cell carcinoma)    pt denies   Venous [place]    right inferior clavicle, shave   Family History  Problem Relation Age of Onset   Anxiety disorder Other    Hyperlipidemia Father    Hypertension Father    CAD Father    Colon cancer Neg Hx    Prostate cancer Neg Hx    Esophageal cancer Neg Hx    Rectal cancer Neg Hx    Stomach cancer Neg Hx    Past Surgical History:  Procedure Laterality Date   APPENDECTOMY     CARPAL TUNNEL RELEASE Right 12/23/2022   Procedure: RIGHT CARPAL TUNNEL RELEASE;  Surgeon: London Sheer, MD;  Location: [facility] OR;  Service: Orthopedics;  Laterality:  Right;   CATARACT EXTRACTION Right 06-2015   COLONOSCOPY     POLYPECTOMY     TRANSURETHRAL RESECTION OF PROSTATE     UPPER GASTROINTESTINAL ENDOSCOPY     Social History   Occupational History   Occupation: retiredClinical cytogeneticist: RETIRED  Tobacco Use   Smoking status: Never    Passive exposure: Never   Smokeless tobacco: Never  Vaping Use   Vaping status: Never Used  Substance and Sexual Activity   Alcohol use: Yes    Alcohol/week: 3.0 standard drinks of alcohol    Types: 3 Glasses of wine per week    Comment: wine   Drug use: No   Sexual activity: Not on file    Sheana Bir Fara Boros) Denese Killings, M.D. [facility] 3:42 PM

## 2023-08-25 DIAGNOSIS — N3010 Interstitial cystitis (chronic) without hematuria: Secondary | ICD-10-CM | POA: Diagnosis not present

## 2023-08-26 ENCOUNTER — Encounter (HOSPITAL_COMMUNITY): Payer: Self-pay

## 2023-08-27 ENCOUNTER — Telehealth (HOSPITAL_COMMUNITY): Payer: Self-pay | Admitting: Emergency Medicine

## 2023-08-27 NOTE — Telephone Encounter (Signed)
Attempted to call patient regarding upcoming cardiac CT appointment. °Left message on voicemail with name and callback number °Dwan Fennel RN Navigator Cardiac Imaging °[facility] °[phone] Office °[phone] Cell ° °

## 2023-08-27 NOTE — Telephone Encounter (Signed)
Attempted to call patient regarding upcoming cardiac CT appointment. Left message on voicemail with name and callback number Hayley Sharpe RN Navigator Cardiac Imaging [facility] [phone] Office   

## 2023-08-28 ENCOUNTER — Ambulatory Visit (HOSPITAL_COMMUNITY)
Admission: RE | Admit: 2023-08-28 | Discharge: 2023-08-28 | Disposition: A | Discharge status: Home / Self Care | Payer: Medicare Other | Source: Ambulatory Visit | Attending: Cardiology | Admitting: Cardiology

## 2023-08-28 DIAGNOSIS — I2510 Atherosclerotic heart disease of native coronary artery without angina pectoris: Secondary | ICD-10-CM | POA: Insufficient documentation

## 2023-08-28 DIAGNOSIS — R0609 Other forms of dyspnea: Secondary | ICD-10-CM | POA: Diagnosis not present

## 2023-08-28 MED ORDER — NITROGLYCERIN 0.4 MG SL SUBL
SUBLINGUAL_TABLET | SUBLINGUAL | Status: AC
  Filled 2023-08-28: qty 2

## 2023-08-28 MED ORDER — IOHEXOL 350 MG/ML SOLN
180.0000 mL | Freq: Once | INTRAVENOUS | Status: AC | PRN
  Administered 2023-08-28: 180 mL via INTRAVENOUS

## 2023-08-28 MED ORDER — NITROGLYCERIN 0.4 MG SL SUBL
0.8000 mg | SUBLINGUAL_TABLET | Freq: Once | SUBLINGUAL | Status: AC
  Administered 2023-08-28: 0.8 mg via SUBLINGUAL

## 2023-09-01 ENCOUNTER — Ambulatory Visit (HOSPITAL_BASED_OUTPATIENT_CLINIC_OR_DEPARTMENT_OTHER)
Admission: RE | Admit: 2023-09-01 | Discharge: 2023-09-01 | Disposition: A | Discharge status: Home / Self Care | Payer: Medicare Other | Source: Ambulatory Visit | Attending: Cardiology | Admitting: Cardiology

## 2023-09-01 DIAGNOSIS — R0609 Other forms of dyspnea: Secondary | ICD-10-CM | POA: Insufficient documentation

## 2023-09-01 LAB — ECHOCARDIOGRAM COMPLETE
AR max vel: 2.35 cm2
AV Area VTI: 2.57 cm2
AV Area mean vel: 2.44 cm2
AV Mean grad: 4 mm[Hg]
AV Peak grad: 6.7 mm[Hg]
Ao pk vel: 1.29 m/s
Area-P 1/2: 1.81 cm2
Calc EF: 59.9 %
S' Lateral: 3 cm
Single Plane A2C EF: 61 %
Single Plane A4C EF: 60.7 %

## 2023-09-07 DIAGNOSIS — H52201 Unspecified astigmatism, right eye: Secondary | ICD-10-CM | POA: Diagnosis not present

## 2023-09-07 DIAGNOSIS — H401132 Primary open-angle glaucoma, bilateral, moderate stage: Secondary | ICD-10-CM | POA: Diagnosis not present

## 2023-09-07 DIAGNOSIS — H2512 Age-related nuclear cataract, left eye: Secondary | ICD-10-CM | POA: Diagnosis not present

## 2023-09-09 ENCOUNTER — Ambulatory Visit: Payer: Medicare Other | Admitting: Internal Medicine

## 2023-09-21 ENCOUNTER — Ambulatory Visit: Payer: Medicare Other | Admitting: Internal Medicine

## 2023-09-21 ENCOUNTER — Telehealth: Payer: Self-pay

## 2023-09-21 DIAGNOSIS — E782 Mixed hyperlipidemia: Secondary | ICD-10-CM

## 2023-09-21 NOTE — Telephone Encounter (Signed)
MyChart message

## 2023-09-21 NOTE — Telephone Encounter (Signed)
-----   Message from Aundra Dubin Revankar sent at 09/17/2023 10:49 AM EDT ----- Unremarkable.  Elevated mildly calcium score.  Please bring him for a Chem-7 liver lipid check when he can.  Diet and exercise.  Copy primary care.  Waiting for the rest of the radiology part of the report he should call us if he does not hear from [place] by 2 weeks. Garwin Brothers, MD 09/17/2023 10:49 AM

## 2023-09-25 ENCOUNTER — Encounter: Payer: Self-pay | Admitting: Internal Medicine

## 2023-09-25 ENCOUNTER — Ambulatory Visit (INDEPENDENT_AMBULATORY_CARE_PROVIDER_SITE_OTHER): Payer: Medicare Other | Admitting: Internal Medicine

## 2023-09-25 VITALS — BP 122/72 | HR 54 | Temp 97.8°F | Resp 16 | Ht 70.0 in | Wt 195.5 lb

## 2023-09-25 DIAGNOSIS — F419 Anxiety disorder, unspecified: Secondary | ICD-10-CM

## 2023-09-25 DIAGNOSIS — I10 Essential (primary) hypertension: Secondary | ICD-10-CM | POA: Diagnosis not present

## 2023-09-25 DIAGNOSIS — G4709 Other insomnia: Secondary | ICD-10-CM

## 2023-09-25 MED ORDER — PAROXETINE HCL 10 MG PO TABS: ORAL_TABLET | ORAL | 0 refills | Status: DC

## 2023-09-25 NOTE — Progress Notes (Unsigned)
Subjective:    Patient ID: Robert Krause, male    DOB: May 25, 1934, 89 y.o.   MRN: [id number]  DOS:  09/25/2023 Type of visit - description: Routine checkup  Since the last visit is doing about the same. Saw cardiology, note and results for a CT reviewed. Continuing anxiety and some difficulty sleeping.  Denies chest pain or difficulty breathing  Review of Systems See above   Past Medical History:  Diagnosis Date   ACTINIC KERATOSIS, CHEEK, LEFT 08/28/2009   Qualifier: Diagnosis of  By: Lovell Sheehan MD, John E    Adenomatous polyp of colon 10/2005   ALLERGIC RHINITIS 09/28/2007   Qualifier: Diagnosis of  By: Lovell Sheehan MD, Balinda Quails    Annual physical exam >>>>>>>>>>>>>>>>>>> 07/18/2014   Anxiety    BPH (benign prostatic hyperplasia)    Carpal tunnel syndrome, left upper limb 01/01/2021   Chronic interstitial cystitis    Dr Logan Bores   Chronic nausea 11/12/2020   Colon polyp 04/14/2011   Contracture of palmar fascia 10/16/2010   Qualifier: Diagnosis of  By: Lovell Sheehan MD, John E    COVID 05/2022   mild   CTS (carpal tunnel syndrome) 01/27/2017   Essential hypertension 09/28/2007   Qualifier: Diagnosis of  By: Lovell Sheehan MD, Balinda Quails    FINGER SUP FB W/O MAJ OPEN WOUND&W/O MENTION INF 02/11/2008   Qualifier: Diagnosis of  By: Lovell Sheehan MD, Balinda Quails    GERD (gastroesophageal reflux disease)    Glaucoma    Hyperlipidemia    Insomnia 06/10/2023   Other chronic sinusitis 09/28/2007   Qualifier: Diagnosis of  By: Lovell Sheehan MD, Balinda Quails    PCP NOTES >>>>>> 11/08/2015   PROSTATITIS, ACUTE, CHRONIC 03/26/2009   Qualifier: Diagnosis of  By: Lovell Sheehan MD, John E    Rib pain on left side 08/13/2022   SCC (squamous cell carcinoma)    pt denies   Venous lake    right inferior clavicle, shave    Past Surgical History:  Procedure Laterality Date   APPENDECTOMY     CARPAL TUNNEL RELEASE Right 12/23/2022   Procedure: RIGHT CARPAL TUNNEL RELEASE;  Surgeon: London Sheer, MD;  Location: [facility] OR;   Service: Orthopedics;  Laterality: Right;   CATARACT EXTRACTION Right 06-2015   COLONOSCOPY     POLYPECTOMY     TRANSURETHRAL RESECTION OF PROSTATE     UPPER GASTROINTESTINAL ENDOSCOPY      Current Outpatient Medications  Medication Instructions   acetaminophen (TYLENOL) 500 mg, Oral, Every 6 hours PRN   ALPRAZolam (XANAX) 0.25-0.5 mg, Oral, At bedtime PRN   irbesartan-hydrochlorothiazide (AVALIDE) 150-12.5 MG tablet 1 tablet, Oral, Daily   latanoprost (XALATAN) 0.005 % ophthalmic solution 1 drop, Both Eyes, Daily at bedtime   pantoprazole (PROTONIX) 40 mg, Oral, Daily   pravastatin (PRAVACHOL) 10 mg, Oral, Daily at bedtime   promethazine (PHENERGAN) 12.5 mg, Oral, 2 times daily PRN   tamsulosin (FLOMAX) 0.4 mg, Oral, Daily at bedtime   zolpidem (AMBIEN) 5 mg, Oral, At bedtime PRN       Objective:   Physical Exam BP 122/72   Pulse (!) 54   Temp 97.8 F (36.6 C) (Oral)   Resp 16   Ht 5\' 10"  (1.778 m)   Wt 195 lb 8 oz (88.7 kg)   SpO2 97%   BMI 28.05 kg/m  General:   Well developed, NAD, BMI noted. HEENT:  Normocephalic . Face symmetric, atraumatic Lungs:  CTA B Normal respiratory effort, no intercostal retractions, no accessory muscle  use. Heart: RRR,  no murmur.  Lower extremities: no pretibial edema bilaterally  Skin: Not pale. Not jaundice Neurologic:  alert & oriented X3.  Speech normal, gait appropriate for age and unassisted Psych--  Cognition and judgment appear intact.  Cooperative with normal attention span and concentration.  Behavior appropriate. No anxious or depressed appearing.      Assessment     Assessment Prediabetes HTN CV: --Bradycardia: Holter 2016 essentially negative --06/07/2019 dx was  DOE and palpitations: ECHO unremarkable, nuclear stress test EF 45%, no ischemic changes -- Coronary CTA (08/2023) : Coronary Ca+ score 65, mild nonobstructive CAD Hyperlipidemia Anxiety: on xanax, stress related to interstitial cystitis, lexapro  intol (-2020) Insomnia  DJD  Glaucoma GU: Dr. Logan Bores --BPH, TURP --Interstitial cystitis --- xanax prn Venous lake, right inferior clavicle Chronic nausea-constipation: GB U/S 2017 (-),  saw GI, EGD 11-2018: Erosive gastritis, BX with no H. pylori, + reactive gastropathy.   CT abdomen 08-2019: Benign.  EGD 6/ 2024: Erosive gastropathy, BX reactive gastropathy.   PLAN HTN: On Avalide, BP today is very good, twice since the last visit BP check at home was low, she was asymptomatic, he thinks it was a cuff malfunction.  Recommend to continue checking ambulatory BPs. Anxiety: See previous entries, related to interstitial cystitis, previously Lexapro intolerant (2020). We talk about options including BuSpar versus and other SSRIs.  We agreed on a rechallenge with SSRI, start Paxil, see instructions.  He also takes Xanax as needed. Insomnia, tried Ambien, help with some, does not like to continue taking it.  Cardiovascular: Cardiology OV 08/06/2023.  CT coronary performed, mildly elevated calcium score.  Plans to see cardiology again for further advice.  Denies chest pain or difficulty breathing. Vaccine advice provided RTC 4 months CPX

## 2023-09-25 NOTE — Patient Instructions (Addendum)
Vaccines I recommend: Covid booster  Start a medication called paroxetine 10 mg: 1 tablet at bedtime for 1 week, then 2 tablets at bedtime.  Continue checking your blood pressure regularly Blood pressure goal:  between 110/65 and  135/85. If it is consistently higher or lower, let me know      Next visit with me in 4 months, physical exam. Please schedule it at the front desk

## 2023-09-27 NOTE — Assessment & Plan Note (Signed)
HTN: On Avalide, BP today is very good, twice since the last visit, BPs at home were low, was asx, he thinks it was a cuff malfunction.  Recommend to continue checking ambulatory BPs. Anxiety: See previous entries, related to interstitial cystitis, previously Lexapro intolerant (2020). We talk about options including BuSpar versus and other SSRIs.  We agreed on a rechallenge with SSRI, start Paxil, see instructions.  Cont xanax as needed. Insomnia, tried Ambien, help with some, does not like to continue taking it. Cardiovascular: Cardiology OV 08/06/2023.  CT coronary performed, mildly elevated calcium score.  Plans to see cardiology again for further advice.  Denies chest pain or difficulty breathing. Vaccine advice provided RTC 4 months CPX

## 2023-09-28 ENCOUNTER — Telehealth: Payer: Self-pay | Admitting: Cardiology

## 2023-09-28 NOTE — Telephone Encounter (Signed)
Left vm to return call.    

## 2023-09-28 NOTE — Telephone Encounter (Signed)
Spoke with pt and advised where to go for labs. Pt verbalized understanding and had mo additional questions.

## 2023-09-28 NOTE — Telephone Encounter (Signed)
Patient is returning call in regards to CT Morph results.

## 2023-09-29 DIAGNOSIS — E782 Mixed hyperlipidemia: Secondary | ICD-10-CM | POA: Diagnosis not present

## 2023-09-30 LAB — COMPREHENSIVE METABOLIC PANEL
ALT: 11 [IU]/L (ref 0–44)
AST: 17 [IU]/L (ref 0–40)
Albumin: 4 g/dL (ref 3.7–4.7)
Alkaline Phosphatase: 67 [IU]/L (ref 44–121)
BUN/Creatinine Ratio: 11 (ref 10–24)
BUN: 12 mg/dL (ref 8–27)
Bilirubin Total: 0.8 mg/dL (ref 0.0–1.2)
CO2: 26 mmol/L (ref 20–29)
Calcium: 8.9 mg/dL (ref 8.6–10.2)
Chloride: 98 mmol/L (ref 96–106)
Creatinine, Ser: 1.11 mg/dL (ref 0.76–1.27)
Globulin, Total: 2.5 g/dL (ref 1.5–4.5)
Glucose: 115 mg/dL — ABNORMAL HIGH (ref 70–99)
Potassium: 4.8 mmol/L (ref 3.5–5.2)
Sodium: 137 mmol/L (ref 134–144)
Total Protein: 6.5 g/dL (ref 6.0–8.5)
eGFR: 65 mL/min/{1.73_m2} (ref >59)

## 2023-09-30 LAB — LIPID PANEL
Chol/HDL Ratio: 2.7 ratio (ref 0.0–5.0)
Cholesterol, Total: 149 mg/dL (ref 100–199)
HDL: 55 mg/dL (ref >39)
LDL Chol Calc (NIH): 79 mg/dL (ref 0–99)
Triglycerides: 75 mg/dL (ref 0–149)
VLDL Cholesterol Cal: 15 mg/dL (ref 5–40)

## 2023-10-12 DIAGNOSIS — H6123 Impacted cerumen, bilateral: Secondary | ICD-10-CM | POA: Diagnosis not present

## 2023-10-27 ENCOUNTER — Other Ambulatory Visit: Payer: Self-pay | Admitting: Internal Medicine

## 2023-11-10 ENCOUNTER — Telehealth: Payer: Self-pay | Admitting: Cardiology

## 2023-11-10 NOTE — Telephone Encounter (Signed)
Left vm to return our call  

## 2023-11-10 NOTE — Telephone Encounter (Signed)
Pt c/o of Chest Pain: STAT if active CP, including tightness, pressure, jaw pain, radiating pain to shoulder/upper arm/back, CP unrelieved by Nitro. Symptoms reported of SOB, nausea, vomiting, sweating.  1. Are you having CP right now?   No    2. Are you experiencing any other symptoms (ex. SOB, nausea, vomiting, sweating)?   Vomiting   3. Is your CP continuous or coming and going? Coming and going  4. Have you taken Nitroglycerin?   No   5. How long have you been experiencing CP?   Started last about a week   6. If NO CP at time of call then end call with telling Pt to call back or call 911 if Chest pain returns prior to return call from triage team.   Patient stated he is concerned he has been having som chest pains and throwing.

## 2023-11-10 NOTE — Telephone Encounter (Signed)
Denies chest pain presently. Appointment made for pt 11/11/23 and advised to go to the ED if pain returns. Pt verbalized understanding and had no additional questions.

## 2023-11-11 ENCOUNTER — Ambulatory Visit: Payer: Medicare Other | Attending: Cardiology | Admitting: Cardiology

## 2023-11-11 VITALS — BP 132/62 | HR 52 | Ht 70.0 in | Wt 195.0 lb

## 2023-11-11 DIAGNOSIS — I2510 Atherosclerotic heart disease of native coronary artery without angina pectoris: Secondary | ICD-10-CM

## 2023-11-11 DIAGNOSIS — E782 Mixed hyperlipidemia: Secondary | ICD-10-CM | POA: Diagnosis not present

## 2023-11-11 HISTORY — DX: Atherosclerotic heart disease of native coronary artery without angina pectoris: I25.10

## 2023-11-11 NOTE — Progress Notes (Signed)
Cardiology Office Note:    Date:  11/11/2023   ID:  Robert Krause, DOB [birth year], MRN [id number]  PCP:  Wanda Plump, MD  Cardiologist:  Garwin Brothers, MD   Referring MD: Wanda Plump, MD    ASSESSMENT:    1. Mixed hyperlipidemia   2. Mild CAD    PLAN:    In order of problems listed above:  Primary prevention stressed with the patient.  Importance of compliance with diet medication stressed and patient verbalized standing. Coronary artery disease: Stable and mild in nature.  I discussed secondary prevention at length diet emphasized exercise stressed and he promises to do better. Mixed dyslipidemia: On lipid-lowering medications.  Lipids reviewed and discussed with him at length. He was advised to walk at least half an hour a day 5 days a week and he promises to do so. Patient will be seen in follow-up appointment in 6 months or earlier if the patient has any concerns.    Medication Adjustments/Labs and Tests Ordered: Current medicines are reviewed at length with the patient today.  Concerns regarding medicines are outlined above.  Orders Placed This Encounter  Procedures   EKG 12-Lead   No orders of the defined types were placed in this encounter.    No chief complaint on file.    History of Present Illness:    Robert Krause is a 89 y.o. male.  Patient has past medical history of essential hypertension and mixed dyslipidemia.  He denies any problems at this time and takes care of activities of daily living.  No chest pain orthopnea or PND.  At the time of my evaluation, the patient is alert awake oriented and in no distress.  He is calcium score is elevated and he is here to discuss this.  Past Medical History:  Diagnosis Date   ACTINIC KERATOSIS, CHEEK, LEFT 08/28/2009   Qualifier: Diagnosis of  By: Lovell Sheehan MD, John E    Adenomatous polyp of colon 10/2005   ALLERGIC RHINITIS 09/28/2007   Qualifier: Diagnosis of  By: Lovell Sheehan MD, John E    Allergic  rhinitis 09/28/2007   Qualifier: Diagnosis of   By: Lovell Sheehan MD, Balinda Quails     IMO SNOMED Dx Update Oct 2024     Annual physical exam >>>>>>>>>>>>>>>>>>> 07/18/2014   Anxiety    BPH (benign prostatic hyperplasia)    Carpal tunnel syndrome, left upper limb 01/01/2021   Chronic interstitial cystitis    Dr Logan Bores   Chronic nausea 11/12/2020   Colon polyp 04/14/2011   Contracture of palmar fascia 10/16/2010   Qualifier: Diagnosis of  By: Lovell Sheehan MD, John E    COVID 05/2022   mild   CTS (carpal tunnel syndrome) 01/27/2017   DOE (dyspnea on exertion) 08/06/2023   Essential hypertension 09/28/2007   Qualifier: Diagnosis of  By: Lovell Sheehan MD, Balinda Quails    FINGER SUP FB W/O MAJ OPEN WOUND&W/O MENTION INF 02/11/2008   Qualifier: Diagnosis of  By: Lovell Sheehan MD, Balinda Quails    GERD (gastroesophageal reflux disease)    Glaucoma    Hyperlipidemia    Insomnia 06/10/2023   Osteoarthritis 09/28/2007   Qualifier: Diagnosis of   By: Lovell Sheehan MD, Balinda Quails     IMO SNOMED Dx Update Oct 2024     Other chronic sinusitis 09/28/2007   Qualifier: Diagnosis of  By: Lovell Sheehan MD, Balinda Quails    PCP NOTES >>>>>> 11/08/2015   PROSTATITIS, ACUTE, CHRONIC 03/26/2009   Qualifier: Diagnosis of  By: Lovell Sheehan MD, John E    Rib pain on left side 08/13/2022   SCC (squamous cell carcinoma)    pt denies   Venous lake    right inferior clavicle, shave    Past Surgical History:  Procedure Laterality Date   APPENDECTOMY     CARPAL TUNNEL RELEASE Right 12/23/2022   Procedure: RIGHT CARPAL TUNNEL RELEASE;  Surgeon: London Sheer, MD;  Location: [facility] OR;  Service: Orthopedics;  Laterality: Right;   CATARACT EXTRACTION Right 06-2015   COLONOSCOPY     POLYPECTOMY     TRANSURETHRAL RESECTION OF PROSTATE     UPPER GASTROINTESTINAL ENDOSCOPY      Current Medications: Current Meds  Medication Sig   acetaminophen (TYLENOL) 500 MG tablet Take 1 tablet (500 mg total) by mouth every 6 (six) hours as needed for moderate pain or mild pain.    ALPRAZolam (XANAX) 0.25 MG tablet Take 0.25-0.5 mg by mouth at bedtime as needed for anxiety.   irbesartan-hydrochlorothiazide (AVALIDE) 150-12.5 MG tablet Take 1 tablet by mouth daily.   latanoprost (XALATAN) 0.005 % ophthalmic solution Place 1 drop into both eyes at bedtime.   pantoprazole (PROTONIX) 40 MG tablet Take 1 tablet (40 mg total) by mouth daily.   pravastatin (PRAVACHOL) 10 MG tablet Take 1 tablet (10 mg total) by mouth at bedtime.   promethazine (PHENERGAN) 12.5 MG tablet Take 1 tablet (12.5 mg total) by mouth 2 (two) times daily as needed for nausea or vomiting.   Tamsulosin HCl (FLOMAX) 0.4 MG CAPS Take 0.4 mg by mouth at bedtime.     Allergies:   Patient has no known allergies.   Social History   Socioeconomic History   Marital status: Married    Spouse name: Not on file   Number of children: 1   Years of education: Not on file   Highest education level: Not on file  Occupational History   Occupation: retired- Sales executive: RETIRED  Tobacco Use   Smoking status: Never    Passive exposure: Never   Smokeless tobacco: Never  Vaping Use   Vaping status: Never Used  Substance and Sexual Activity   Alcohol use: Yes    Alcohol/week: 3.0 standard drinks of alcohol    Types: 3 Glasses of wine per week    Comment: wine   Drug use: No   Sexual activity: Not on file  Other Topics Concern   Not on file  Social History Narrative   Born in [place], raised in [place], moved to [place] in the 60s   Household: pt and wife   Son lives in the [place]    Social Drivers of Health   Financial Resource Strain: Low Risk  (08/24/2020)   Overall Financial Resource Strain (CARDIA)    Difficulty of Paying Living Expenses: Not very hard  Food Insecurity: Low Risk  (08/25/2023)   Received from [facility]   Hunger Vital Sign    Worried About Running Out of Food in the Last Year: Never true    Ran Out of Food in the Last Year: Never true  Transportation Needs: No Transportation Needs  (08/25/2023)   Received from [facility]    In the past 12 months, has lack of reliable transportation kept you from medical appointments, meetings, work or from getting things needed for daily living? : No  Physical Activity: Not on file  Stress: Not on file  Social Connections: Not on file  Family History: The patient's family history includes Anxiety disorder in an other family member; CAD in his father; Hyperlipidemia in his father; Hypertension in his father. There is no history of Colon cancer, Prostate cancer, Esophageal cancer, Rectal cancer, or Stomach cancer.  ROS:   Please see the history of present illness.    All other systems reviewed and are negative.  EKGs/Labs/Other Studies Reviewed:    The following studies were reviewed today: I discussed my findings with the patient at length   Recent Labs: 05/04/2023: Hemoglobin 14.9; Platelets 197.0; TSH 2.06 09/29/2023: ALT 11; BUN 12; Creatinine, Ser 1.11; Potassium 4.8; Sodium 137  Recent Lipid Panel    Component Value Date/Time   CHOL 149 09/29/2023 0833   TRIG 75 09/29/2023 0833   TRIG 55 10/05/2006 1439   HDL 55 09/29/2023 0833   CHOLHDL 2.7 09/29/2023 0833   CHOLHDL 3 12/09/2022 0840   VLDL 14.0 12/09/2022 0840   LDLCALC 79 09/29/2023 0833   LDLDIRECT 153.4 06/06/2013 1156    Physical Exam:    VS:  BP 132/62   Pulse (!) 52   Ht 5\' 10"  (1.778 m)   Wt 195 lb (88.5 kg)   SpO2 99%   BMI 27.98 kg/m     Wt Readings from Last 3 Encounters:  11/11/23 195 lb (88.5 kg)  09/25/23 195 lb 8 oz (88.7 kg)  08/06/23 194 lb 1.3 oz (88 kg)     GEN: Patient is in no acute distress HEENT: Normal NECK: No JVD; No carotid bruits LYMPHATICS: No lymphadenopathy CARDIAC: Hear sounds regular, 2/6 systolic murmur at the apex. RESPIRATORY:  Clear to auscultation without rales, wheezing or rhonchi  ABDOMEN: Soft, non-tender, non-distended MUSCULOSKELETAL:  No edema; No deformity  SKIN: Warm and  dry NEUROLOGIC:  Alert and oriented x 3 PSYCHIATRIC:  Normal affect   Signed, Garwin Brothers, MD  11/11/2023 11:41 AM    [facility]

## 2023-11-11 NOTE — Patient Instructions (Signed)
Medication Instructions:  Your physician recommends that you continue on your current medications as directed. Please refer to the Current Medication list given to you today.  *If you need a refill on your cardiac medications before your next appointment, please call your pharmacy*   Lab Work: None ordered If you have labs (blood work) drawn today and your tests are completely normal, you will receive your results only by: [place] (if you have MyChart) OR A paper copy in the mail If you have any lab test that is abnormal or we need to change your treatment, we will call you to review the results.   Testing/Procedures: None ordered   Follow-Up: At [facility], you and your health needs are our priority.  As part of our continuing mission to provide you with exceptional heart care, we have created designated Provider Care Teams.  These Care Teams include your primary Cardiologist (physician) and Advanced Practice Providers (APPs -  Physician Assistants and Nurse Practitioners) who all work together to provide you with the care you need, when you need it.  We recommend signing up for the patient portal called "MyChart".  Sign up information is provided on this After Visit Summary.  MyChart is used to connect with patients for Virtual Visits (Telemedicine).  Patients are able to view lab/test results, encounter notes, upcoming appointments, etc.  Non-urgent messages can be sent to your provider as well.   To learn more about what you can do with MyChart, go to [email or web address].    Your next appointment:   4 month(s)  The format for your next appointment:   In Person  Provider:   Jyl Heinz, MD    Other Instructions none  Important Information About Sugar

## 2023-11-30 ENCOUNTER — Ambulatory Visit: Payer: Medicare Other | Admitting: Orthopedic Surgery

## 2023-12-14 DIAGNOSIS — H2512 Age-related nuclear cataract, left eye: Secondary | ICD-10-CM | POA: Diagnosis not present

## 2023-12-14 DIAGNOSIS — H401132 Primary open-angle glaucoma, bilateral, moderate stage: Secondary | ICD-10-CM | POA: Diagnosis not present

## 2023-12-23 ENCOUNTER — Ambulatory Visit: Payer: Medicare Other | Admitting: Orthopedic Surgery

## 2023-12-23 ENCOUNTER — Other Ambulatory Visit: Payer: Self-pay | Admitting: Orthopedic Surgery

## 2023-12-23 ENCOUNTER — Other Ambulatory Visit: Payer: Self-pay

## 2023-12-23 DIAGNOSIS — M542 Cervicalgia: Secondary | ICD-10-CM | POA: Diagnosis not present

## 2023-12-23 MED ORDER — CELECOXIB 200 MG PO CAPS: 200.0000 mg | ORAL_CAPSULE | Freq: Two times a day (BID) | ORAL | 1 refills | Status: DC | PRN

## 2023-12-23 NOTE — Addendum Note (Signed)
Addended by: Willia Craze on: 12/23/2023 02:30 PM   Modules accepted: Orders

## 2023-12-23 NOTE — Progress Notes (Signed)
Orthopedic Spine Surgery Office Note  Assessment: Patient is a 89 y.o. male with neck pain that radiates into the right arm along the lateral aspect.  Has positive cervical SVA and degenerative changes within the cervical spine.  Possible cervical radiculopathy causing his radiating arm pain.   Plan: -Explained that initially conservative treatment is tried as a significant number of patients may experience relief with these treatment modalities. Discussed that the conservative treatments include:  -activity modification  -physical therapy  -over the counter pain medications  -medrol dosepak  -cervical steroid injections -Patient has tried activity modification -Recommended tylenol.  Prescribed Celebrex for additional pain relief -Patient should return to office on as-needed basis   Patient expressed understanding of the plan and all questions were answered to the patient's satisfaction.   __________________________________________________________________________  History: Patient is a 89 y.o. male who has been previously seen in the office for carpal tunnel syndrome.  He comes back in today with neck pain.  He feels the neck pain in the middle of his neck and he has pain that radiates into the right upper extremity.  He has noticed the right upper extremity pain for about 3 months now.  He feels along the lateral aspect of his arm.  It does not extend past the elbow.  He has no pain rating into the left upper extremity.  There is no trauma or injury that preceded the onset of his symptoms.  He has felt the neck pain for a longer period of time.  He notices it mostly as the day goes on.  He has not noticed any weakness in his arms or legs.  No saddle anesthesia.  No bowel or bladder incontinence.  Has trouble with fine motor skills on the left hand due to carpal tunnel syndrome and the decrease sensation in his hands.  No other trouble with fine motor skills in the hand.  No instability or  unsteadiness.  Previous treatments: activity modification   Physical Exam:  General: no acute distress, appears stated age Neurologic: alert, answering questions appropriately, following commands Respiratory: unlabored breathing on room air, symmetric chest rise Psychiatric: appropriate affect, normal cadence to speech   MSK (spine):  -Strength exam      Left  Right Grip strength                5/5  5/5 Interosseus   5/5   5/5 Wrist extension  5/5  5/5 Wrist flexion   5/5  5/5 Elbow flexion   5/5  5/5 Deltoid    5/5  5/5  -Sensory exam    Sensation intact to light touch in L3-S1 nerve distributions of bilateral lower extremities  Left shoulder exam: No pain through range of motion Right shoulder exam: No pain through range of motion   Imaging: XRs of the cervical spine from 12/23/2023 were independently reviewed and interpreted, showing disc height loss at C5/6 and C6/7.  Anterior osteophyte formation at those levels well.  Multiple levels with facet arthropathy.  Positive cervical SVA that measures 5.5 cm.  No fracture or dislocation seen.  No evidence of instability on flexion/extension views.    Patient name: Robert Krause Patient MRN: [id number] Date of visit: 12/23/23

## 2024-01-19 ENCOUNTER — Ambulatory Visit: Payer: Medicare Other | Admitting: Pediatrics

## 2024-01-26 ENCOUNTER — Encounter: Payer: Self-pay | Admitting: Internal Medicine

## 2024-01-26 ENCOUNTER — Ambulatory Visit: Payer: Medicare Other | Admitting: Internal Medicine

## 2024-01-26 VITALS — BP 116/66 | HR 54 | Temp 97.7°F | Resp 16 | Ht 70.0 in | Wt 195.1 lb

## 2024-01-26 DIAGNOSIS — E559 Vitamin D deficiency, unspecified: Secondary | ICD-10-CM | POA: Diagnosis not present

## 2024-01-26 DIAGNOSIS — Z0000 Encounter for general adult medical examination without abnormal findings: Secondary | ICD-10-CM | POA: Diagnosis not present

## 2024-01-26 DIAGNOSIS — F419 Anxiety disorder, unspecified: Secondary | ICD-10-CM

## 2024-01-26 DIAGNOSIS — E782 Mixed hyperlipidemia: Secondary | ICD-10-CM

## 2024-01-26 DIAGNOSIS — Z0001 Encounter for general adult medical examination with abnormal findings: Secondary | ICD-10-CM

## 2024-01-26 DIAGNOSIS — I10 Essential (primary) hypertension: Secondary | ICD-10-CM | POA: Diagnosis not present

## 2024-01-26 LAB — CBC WITH DIFFERENTIAL/PLATELET
Basophils Absolute: 0.1 10*3/uL (ref 0.0–0.1)
Basophils Relative: 1.1 % (ref 0.0–3.0)
Eosinophils Absolute: 0.2 10*3/uL (ref 0.0–0.7)
Eosinophils Relative: 3 % (ref 0.0–5.0)
HCT: 44.6 % (ref 39.0–52.0)
Hemoglobin: 15.1 g/dL (ref 13.0–17.0)
Lymphocytes Relative: 28.8 % (ref 12.0–46.0)
Lymphs Abs: 1.6 10*3/uL (ref 0.7–4.0)
MCHC: 33.8 g/dL (ref 30.0–36.0)
MCV: 94.8 fl (ref 78.0–100.0)
Monocytes Absolute: 0.5 10*3/uL (ref 0.1–1.0)
Monocytes Relative: 9.8 % (ref 3.0–12.0)
Neutro Abs: 3.1 10*3/uL (ref 1.4–7.7)
Neutrophils Relative %: 57.3 % (ref 43.0–77.0)
Platelets: 181 10*3/uL (ref 150.0–400.0)
RBC: 4.71 Mil/uL (ref 4.22–5.81)
RDW: 12.7 % (ref 11.5–15.5)
WBC: 5.5 10*3/uL (ref 4.0–10.5)

## 2024-01-26 LAB — VITAMIN D 25 HYDROXY (VIT D DEFICIENCY, FRACTURES): VITD: 31.95 ng/mL (ref 30.00–100.00)

## 2024-01-26 MED ORDER — MECLIZINE HCL 25 MG PO TABS: 25.0000 mg | ORAL_TABLET | Freq: Three times a day (TID) | ORAL | 0 refills | Status: AC | PRN

## 2024-01-26 NOTE — Patient Instructions (Signed)
  Check the  blood pressure regularly Blood pressure goal:  between 110/65 and  135/85. If it is consistently higher or lower, let me know     GO TO THE LAB : Get the blood work     Please go to the front desk and schedule the following: Follow-up in 6 months       "Health Care Power of attorney" (Also know as a  "Living will" or  Advance care planning documents)  If you already have a living will or healthcare power of attorney, is recommended you bring the copy to be scanned in your chart.   The document will be available to all the doctors you see in the system.  If you are over 88 y/o and don't have the document, please read:  Advance care planning is a process that supports adults in  understanding and sharing their preferences regarding future medical care.  The patient's preferences are recorded in documents called Advance Directives and the can be modified at any time while the patient is in full mental capacity.     More information at: [email or web address]

## 2024-01-26 NOTE — Progress Notes (Unsigned)
 Subjective:    Patient ID: Robert Krause, male    DOB: 1934/02/07, 89 y.o.   MRN: [id number]  DOS:  01/26/2024 Type of visit - description: CPX Since the last office visit is doing well. Chronic medical problems were addressed. He denies chest pain or difficulty breathing. Has chronic vertigo, request a refill of meclizine which he takes sporadically. Saw cardiology, note reviewed. Has chronic constipation on and off but denies blood in the stools or diarrhea.   Review of Systems See above   Past Medical History:  Diagnosis Date   ACTINIC KERATOSIS, CHEEK, LEFT 08/28/2009   Qualifier: Diagnosis of  By: Lovell Sheehan MD, John E    Adenomatous polyp of colon 10/2005   ALLERGIC RHINITIS 09/28/2007   Qualifier: Diagnosis of  By: Lovell Sheehan MD, John E    Allergic rhinitis 09/28/2007   Qualifier: Diagnosis of   By: Lovell Sheehan MD, Balinda Quails     IMO SNOMED Dx Update Oct 2024     Annual physical exam >>>>>>>>>>>>>>>>>>> 07/18/2014   Anxiety    BPH (benign prostatic hyperplasia)    Carpal tunnel syndrome, left upper limb 01/01/2021   Chronic interstitial cystitis    Dr Logan Bores   Chronic nausea 11/12/2020   Colon polyp 04/14/2011   Contracture of palmar fascia 10/16/2010   Qualifier: Diagnosis of  By: Lovell Sheehan MD, John E    COVID 05/2022   mild   CTS (carpal tunnel syndrome) 01/27/2017   DOE (dyspnea on exertion) 08/06/2023   Essential hypertension 09/28/2007   Qualifier: Diagnosis of  By: Lovell Sheehan MD, Balinda Quails    FINGER SUP FB W/O MAJ OPEN WOUND&W/O MENTION INF 02/11/2008   Qualifier: Diagnosis of  By: Lovell Sheehan MD, Balinda Quails    GERD (gastroesophageal reflux disease)    Glaucoma    Hyperlipidemia    Insomnia 06/10/2023   Osteoarthritis 09/28/2007   Qualifier: Diagnosis of   By: Lovell Sheehan MD, Balinda Quails     IMO SNOMED Dx Update Oct 2024     Other chronic sinusitis 09/28/2007   Qualifier: Diagnosis of  By: Lovell Sheehan MD, Balinda Quails    PCP NOTES >>>>>> 11/08/2015   PROSTATITIS, ACUTE, CHRONIC 03/26/2009    Qualifier: Diagnosis of  By: Lovell Sheehan MD, John E    Rib pain on left side 08/13/2022   SCC (squamous cell carcinoma)    pt denies   Venous lake    right inferior clavicle, shave    Past Surgical History:  Procedure Laterality Date   APPENDECTOMY     CARPAL TUNNEL RELEASE Right 12/23/2022   Procedure: RIGHT CARPAL TUNNEL RELEASE;  Surgeon: London Sheer, MD;  Location: [facility] OR;  Service: Orthopedics;  Laterality: Right;   CATARACT EXTRACTION Right 06-2015   COLONOSCOPY     POLYPECTOMY     TRANSURETHRAL RESECTION OF PROSTATE     UPPER GASTROINTESTINAL ENDOSCOPY      Current Outpatient Medications  Medication Instructions   acetaminophen (TYLENOL) 500 mg, Oral, Every 6 hours PRN   ALPRAZolam (XANAX) 0.25-0.5 mg, At bedtime PRN   celecoxib (CELEBREX) 200 MG capsule TAKE 1 CAPSULE(200 MG) BY MOUTH TWICE DAILY AS NEEDED FOR PAIN   irbesartan-hydrochlorothiazide (AVALIDE) 150-12.5 MG tablet 1 tablet, Oral, Daily   latanoprost (XALATAN) 0.005 % ophthalmic solution 1 drop, Daily at bedtime   pantoprazole (PROTONIX) 40 mg, Oral, Daily   PARoxetine (PAXIL) 20 mg, Oral, Daily at bedtime   pravastatin (PRAVACHOL) 10 mg, Oral, Daily at bedtime   promethazine (PHENERGAN) 12.5 mg, Oral,  2 times daily PRN   tamsulosin (FLOMAX) 0.4 mg, Daily at bedtime       Objective:   Physical Exam BP 116/66   Pulse (!) 54   Temp 97.7 F (36.5 C) (Oral)   Resp 16   Ht 5\' 10"  (1.778 m)   Wt 195 lb 2 oz (88.5 kg)   SpO2 98%   BMI 28.00 kg/m  General: Well developed, NAD, BMI noted Neck: No  thyromegaly  HEENT:  Normocephalic . Face symmetric, atraumatic Lungs:  CTA B Normal respiratory effort, no intercostal retractions, no accessory muscle use. Heart: RRR,  no murmur.  Abdomen:  Not distended, soft, non-tender. No rebound or rigidity.   Lower extremities: no pretibial edema bilaterally  Skin: Exposed areas without rash. Not pale. Not jaundice Neurologic:  alert & oriented X3.  Speech  normal, gait appropriate for age and unassisted Strength symmetric and appropriate for age.  Psych: Cognition and judgment appear intact.  Cooperative with normal attention span and concentration.  Behavior appropriate. No anxious or depressed appearing.     Assessment     Assessment Prediabetes HTN CV: --Bradycardia: Holter 2016 essentially negative --06/07/2019 dx was  DOE and palpitations: ECHO unremarkable, nuclear stress test EF 45%, no ischemic changes -- Coronary CTA (08/2023) : Coronary Ca+ score 65, mild nonobstructive CAD Hyperlipidemia Anxiety: on xanax, stress related to interstitial cystitis, lexapro intol (-2020) Insomnia  DJD  Glaucoma GU: Dr. Logan Bores --BPH, TURP --Interstitial cystitis --- xanax prn Venous lake, right inferior clavicle Chronic nausea-constipation: GB U/S 2017 (-),  saw GI, EGD 11-2018: Erosive gastritis, BX with no H. pylori, + reactive gastropathy.   CT abdomen 08-2019: Benign.  EGD 6/ 2024: Erosive gastropathy, BX reactive gastropathy. Vertigo  PLAN Here for CPX -Td :2017  - pnm 23: 2004, 2016; prevanr  2015; PNM 20: 2024 - zostavax: 2014 per pt; - s/p  shingrix (x2) - had a RSV per pt  -Had a flu shot and a covid vax ~ 08/2023 -  [facility]   cscope 2012, Cscope 12/2017.  Aged out of routine screening, encouraged to call if he has GI symptoms -Prostate ca screening-- per urology -Diet exercise: Counseled  -Labs: Reviewed, will get CBC and vitamin D. - Healthcare power of attorney: Recommend to bring a copy.   Prediabetes: Last A1c excellent, consider recheck on RTC HTN: BP is very good, recommend to check from time to time, continue Avalide.  Last BMP okay. Cardiovascular: On Pravachol, LDL September 29, 1999 2479, saw cardiology  November 11, 2023.  No changes made. DJD: c/ cervicalgia , saw orthopedics 12/23/2023.  Multiple recommendations including Celebrex but had gi s/e and self stopped it.  On Tylenol. Anxiety: See last visit, was Rx Paxil,  at this point he is not taking it.  Reports she is doing okay emotionally. Vertigo: He reports chronic issue with vertigo, on meclizine, request a refill.  Will do Rule out vitamin D deficiency: Request to check.  Will do. RTC 6 months.    11 HTN: On Avalide, BP today is very good, twice since the last visit, BPs at home were low, was asx, he thinks it was a cuff malfunction.  Recommend to continue checking ambulatory BPs. Anxiety: See previous entries, related to interstitial cystitis, previously Lexapro intolerant (2020). We talk about options including BuSpar versus and other SSRIs.  We agreed on a rechallenge with SSRI, start Paxil, see instructions.  Cont xanax as needed. Insomnia, tried Ambien, help with some, does not like to  continue taking it. Cardiovascular: Cardiology OV 08/06/2023.  CT coronary performed, mildly elevated calcium score.  Plans to see cardiology again for further advice.  Denies chest pain or difficulty breathing. Vaccine advice provided RTC 4 months CPX

## 2024-01-27 ENCOUNTER — Encounter: Payer: Self-pay | Admitting: Internal Medicine

## 2024-01-27 DIAGNOSIS — R42 Dizziness and giddiness: Secondary | ICD-10-CM | POA: Insufficient documentation

## 2024-01-27 HISTORY — DX: Dizziness and giddiness: R42

## 2024-01-27 NOTE — Assessment & Plan Note (Signed)
 Here for CPX -Td :2017  - pnm 23: 2004, 2016; prevanr  2015; PNM 20: 2024 - zostavax: 2014 per pt;  s/p  shingrix (x2) - had a RSV per pt  -Had a flu shot and a covid vax ~ 08/2023 -  [facility]   cscope 2012, Cscope 12/2017.  Aged out of routine screening but encouraged to call if he has GI symptoms -Prostate ca screening-- per urology -Diet exercise: Counseled  -Labs: Reviewed, will get CBC and vitamin D. - Healthcare power of attorney: Recommend to bring a copy.

## 2024-01-27 NOTE — Assessment & Plan Note (Signed)
 Here for CPX   We also discussed the following Prediabetes: Last A1c excellent, consider recheck on RTC HTN: BP is very good, recommend to check from time to time, continue Avalide.  Last BMP okay. High cholesterol: On Pravachol, LDL November 5 >> 55.  No change. Saw cardiology in December for his routine checkup.  No changes made. DJD: c/o cervicalgia , saw orthopedics 12/23/2023.  Took Celebrex temporarily but stopped it due to GI side effects, on Tylenol. Anxiety: See last visit, was Rx Paxil, at this point he is not taking it.  Reports she is doing okay emotionally. Vertigo: He reports a chronic issue with vertigo, on meclizine, request a refill.  Will do R/o Vitamin D deficiency: Request to check.  Will do. RTC 6 months.

## 2024-01-28 ENCOUNTER — Encounter: Payer: Self-pay | Admitting: Internal Medicine

## 2024-01-31 ENCOUNTER — Other Ambulatory Visit: Payer: Self-pay | Admitting: Internal Medicine

## 2024-02-08 ENCOUNTER — Other Ambulatory Visit: Payer: Self-pay | Admitting: Internal Medicine

## 2024-02-18 ENCOUNTER — Ambulatory Visit: Payer: Medicare Other | Admitting: Cardiology

## 2024-02-22 ENCOUNTER — Ambulatory Visit: Payer: Medicare Other | Admitting: Physician Assistant

## 2024-02-25 ENCOUNTER — Ambulatory Visit: Payer: Medicare Other | Admitting: Cardiology

## 2024-03-02 DIAGNOSIS — H2512 Age-related nuclear cataract, left eye: Secondary | ICD-10-CM | POA: Diagnosis not present

## 2024-03-02 DIAGNOSIS — H2181 Floppy iris syndrome: Secondary | ICD-10-CM | POA: Diagnosis not present

## 2024-03-02 DIAGNOSIS — H25812 Combined forms of age-related cataract, left eye: Secondary | ICD-10-CM | POA: Diagnosis not present

## 2024-03-08 ENCOUNTER — Ambulatory Visit (INDEPENDENT_AMBULATORY_CARE_PROVIDER_SITE_OTHER): Admitting: Internal Medicine

## 2024-03-08 ENCOUNTER — Other Ambulatory Visit (INDEPENDENT_AMBULATORY_CARE_PROVIDER_SITE_OTHER)

## 2024-03-08 ENCOUNTER — Ambulatory Visit (HOSPITAL_BASED_OUTPATIENT_CLINIC_OR_DEPARTMENT_OTHER)
Admission: RE | Admit: 2024-03-08 | Discharge: 2024-03-08 | Disposition: A | Discharge status: Home / Self Care | Source: Ambulatory Visit | Attending: Internal Medicine | Admitting: Internal Medicine

## 2024-03-08 ENCOUNTER — Encounter: Payer: Self-pay | Admitting: Internal Medicine

## 2024-03-08 VITALS — BP 134/80 | HR 68 | Temp 98.0°F | Resp 16 | Ht 70.0 in | Wt 194.1 lb

## 2024-03-08 DIAGNOSIS — R079 Chest pain, unspecified: Secondary | ICD-10-CM | POA: Diagnosis not present

## 2024-03-08 DIAGNOSIS — R110 Nausea: Secondary | ICD-10-CM | POA: Diagnosis not present

## 2024-03-08 DIAGNOSIS — R739 Hyperglycemia, unspecified: Secondary | ICD-10-CM

## 2024-03-08 LAB — CBC WITH DIFFERENTIAL/PLATELET
Basophils Absolute: 0.1 10*3/uL (ref 0.0–0.1)
Basophils Relative: 1.2 % (ref 0.0–3.0)
Eosinophils Absolute: 0.1 10*3/uL (ref 0.0–0.7)
Eosinophils Relative: 2.3 % (ref 0.0–5.0)
HCT: 42.8 % (ref 39.0–52.0)
Hemoglobin: 14.5 g/dL (ref 13.0–17.0)
Lymphocytes Relative: 33.2 % (ref 12.0–46.0)
Lymphs Abs: 1.8 10*3/uL (ref 0.7–4.0)
MCHC: 33.9 g/dL (ref 30.0–36.0)
MCV: 94.7 fl (ref 78.0–100.0)
Monocytes Absolute: 0.6 10*3/uL (ref 0.1–1.0)
Monocytes Relative: 11.1 % (ref 3.0–12.0)
Neutro Abs: 2.8 10*3/uL (ref 1.4–7.7)
Neutrophils Relative %: 52.2 % (ref 43.0–77.0)
Platelets: 220 10*3/uL (ref 150.0–400.0)
RBC: 4.52 Mil/uL (ref 4.22–5.81)
RDW: 13 % (ref 11.5–15.5)
WBC: 5.3 10*3/uL (ref 4.0–10.5)

## 2024-03-08 LAB — BASIC METABOLIC PANEL WITH GFR
BUN: 15 mg/dL (ref 6–23)
CO2: 29 meq/L (ref 19–32)
Calcium: 8.9 mg/dL (ref 8.4–10.5)
Chloride: 96 meq/L (ref 96–112)
Creatinine, Ser: 0.98 mg/dL (ref 0.40–1.50)
GFR: 69.4 mL/min (ref >60.00)
Glucose, Bld: 110 mg/dL — ABNORMAL HIGH (ref 70–99)
Potassium: 4.2 meq/L (ref 3.5–5.1)
Sodium: 130 meq/L — ABNORMAL LOW (ref 135–145)

## 2024-03-08 LAB — SEDIMENTATION RATE: Sed Rate: 4 mm/h (ref 0–20)

## 2024-03-08 MED ORDER — PROMETHAZINE HCL 12.5 MG PO TABS: 12.5000 mg | ORAL_TABLET | Freq: Two times a day (BID) | ORAL | 0 refills | Status: DC | PRN

## 2024-03-08 NOTE — Patient Instructions (Addendum)
 INSTRUCTIONS  FOR TODAY      GO TO THE LAB : Get the blood work    STOP BY THE FIRST FLOOR:  get the XR   Rest, drink plenty of fluids. If your tests came back negative we will wait for few more days. If the pain continue: Me know. If you have severe symptoms including severe pain, difficulty breathing, fever or chills, rash: Call or go to the emergency room

## 2024-03-08 NOTE — Addendum Note (Signed)
 Addended by: Tiphany Fayson D on: 03/08/2024 02:31 PM   Modules accepted: Orders

## 2024-03-08 NOTE — Assessment & Plan Note (Signed)
 Right-sided chest pain As described above, DDx include MSK, pleurisy, gallbladder less likely.  PE very unlikely. No red flag symptoms, vital signs are stable, not on distress. Plan: Chest x-ray stat, CBC BMP and sed rate. Further advised for results. Chronic nausea, chronic constipation: RF Phenergan today.

## 2024-03-08 NOTE — Progress Notes (Signed)
 Subjective:    Patient ID: Robert Krause, male    DOB: 04/01/1934, 89 y.o.   MRN: [id number]  DOS:  03/08/2024 Type of visit - description: acute  Sxs started ~ 10-12 days ago Pain located at the R side of the anterior chest , steady, no radiation. Does not change w/ po intake or deep breathing Worse when he turns in bed.  Has chronic GI symptoms (some nausea).  Denies diarrhea, has some constipation. Denies a rash in the area. No injury or fall No recent prolonged trips. No leg swelling. Denies difficulty breathing or cough. Has interstitial cystitis, symptoms at baseline, no gross hematuria.  Review of Systems See above   Past Medical History:  Diagnosis Date   ACTINIC KERATOSIS, CHEEK, LEFT 08/28/2009   Qualifier: Diagnosis of  By: Lovell Sheehan MD, John E    Adenomatous polyp of colon 10/2005   ALLERGIC RHINITIS 09/28/2007   Qualifier: Diagnosis of  By: Lovell Sheehan MD, John E    Allergic rhinitis 09/28/2007   Qualifier: Diagnosis of   By: Lovell Sheehan MD, Balinda Quails     IMO SNOMED Dx Update Oct 2024     Annual physical exam >>>>>>>>>>>>>>>>>>> 07/18/2014   Anxiety    BPH (benign prostatic hyperplasia)    Carpal tunnel syndrome, left upper limb 01/01/2021   Chronic interstitial cystitis    Dr Logan Bores   Chronic nausea 11/12/2020   Colon polyp 04/14/2011   Contracture of palmar fascia 10/16/2010   Qualifier: Diagnosis of  By: Lovell Sheehan MD, John E    COVID 05/2022   mild   CTS (carpal tunnel syndrome) 01/27/2017   DOE (dyspnea on exertion) 08/06/2023   Essential hypertension 09/28/2007   Qualifier: Diagnosis of  By: Lovell Sheehan MD, Balinda Quails    FINGER SUP FB W/O MAJ OPEN WOUND&W/O MENTION INF 02/11/2008   Qualifier: Diagnosis of  By: Lovell Sheehan MD, Balinda Quails    GERD (gastroesophageal reflux disease)    Glaucoma    Hyperlipidemia    Insomnia 06/10/2023   Mild CAD 11/11/2023   Osteoarthritis 09/28/2007   Qualifier: Diagnosis of   By: Lovell Sheehan MD, Balinda Quails     IMO SNOMED Dx Update Oct 2024      Other chronic sinusitis 09/28/2007   Qualifier: Diagnosis of  By: Lovell Sheehan MD, Balinda Quails    PCP NOTES >>>>>> 11/08/2015   PROSTATITIS, ACUTE, CHRONIC 03/26/2009   Qualifier: Diagnosis of  By: Lovell Sheehan MD, John E    Rib pain on left side 08/13/2022   SCC (squamous cell carcinoma)    pt denies   Venous lake    right inferior clavicle, shave   Vertigo 01/27/2024    Past Surgical History:  Procedure Laterality Date   APPENDECTOMY     CARPAL TUNNEL RELEASE Right 12/23/2022   Procedure: RIGHT CARPAL TUNNEL RELEASE;  Surgeon: London Sheer, MD;  Location: [facility] OR;  Service: Orthopedics;  Laterality: Right;   CATARACT EXTRACTION Right 06-2015   COLONOSCOPY     POLYPECTOMY     TRANSURETHRAL RESECTION OF PROSTATE     UPPER GASTROINTESTINAL ENDOSCOPY      Current Outpatient Medications  Medication Instructions   acetaminophen (TYLENOL) 500 mg, Oral, Every 6 hours PRN   ALPRAZolam (XANAX) 0.25-0.5 mg, At bedtime PRN   irbesartan-hydrochlorothiazide (AVALIDE) 150-12.5 MG tablet 1 tablet, Oral, Daily   latanoprost (XALATAN) 0.005 % ophthalmic solution 1 drop, Daily at bedtime   meclizine (ANTIVERT) 25 mg, Oral, 3 times daily PRN   pantoprazole (PROTONIX) 40  mg, Oral, Daily   pravastatin (PRAVACHOL) 10 mg, Oral, Daily at bedtime   promethazine (PHENERGAN) 12.5 mg, Oral, 2 times daily PRN   tamsulosin (FLOMAX) 0.4 mg, Daily at bedtime       Objective:   Physical Exam Abdominal:       Comments: Area of perceived pain is at the right anterior distal chest.  Area is slightly TTP otherwise normal.     BP 134/80   Pulse 68   Temp 98 F (36.7 C) (Oral)   Resp 16   Ht 5\' 10"  (1.778 m)   Wt 194 lb 2 oz (88.1 kg)   SpO2 96%   BMI 27.85 kg/m  General:   Well developed, NAD, BMI noted.  HEENT:  Normocephalic . Face symmetric, atraumatic Lungs:  CTA B Normal respiratory effort, no intercostal retractions, no accessory muscle use. Heart: RRR,  no murmur.  Abdomen:  Not distended,  soft, non-tender to deep palpation.  No obvious organomegaly. Skin: Not pale. Not jaundice Lower extremities: no pretibial edema bilaterally , calces symmetric not TTP Neurologic:  alert & oriented X3.  Speech normal, gait appropriate for age and unassisted Psych--  Cognition and judgment appear intact.  Cooperative with normal attention span and concentration.  Behavior appropriate. No anxious or depressed appearing.     Assessment     Problem list Prediabetes HTN CV: --Bradycardia: Holter 2016 essentially negative --06/07/2019 dx was  DOE and palpitations: ECHO unremarkable, nuclear stress test EF 45%, no ischemic changes -- Coronary CTA (08/2023) : Coronary Ca+ score 65, mild nonobstructive CAD Hyperlipidemia Anxiety: on xanax, stress related to interstitial cystitis, lexapro intol (-2020) Insomnia  DJD  Glaucoma GU: Dr. Luster Salters --BPH, TURP --Interstitial cystitis --- xanax prn Venous lake, right inferior clavicle Chronic nausea-constipation: GB U/S 2017 (-),  saw GI, EGD 11-2018: Erosive gastritis, BX with no H. pylori, + reactive gastropathy.   CT abdomen 08-2019: Benign.  EGD 6/ 2024: Erosive gastropathy, BX reactive gastropathy. Vertigo  PLAN Right-sided chest pain As described above, DDx include MSK, pleurisy, gallbladder less likely.  PE very unlikely. No red flag symptoms, vital signs are stable, not on distress. Plan: Chest x-ray stat, CBC BMP and sed rate. Further advised for results. Chronic nausea, chronic constipation: RF Phenergan today.

## 2024-03-09 LAB — HEMOGLOBIN A1C: Hgb A1c MFr Bld: 5.6 % (ref 4.6–6.5)

## 2024-03-14 ENCOUNTER — Encounter: Payer: Self-pay | Admitting: Internal Medicine

## 2024-03-17 ENCOUNTER — Other Ambulatory Visit: Payer: Self-pay

## 2024-03-22 ENCOUNTER — Ambulatory Visit: Attending: Cardiology | Admitting: Cardiology

## 2024-03-22 ENCOUNTER — Encounter: Payer: Self-pay | Admitting: Cardiology

## 2024-03-22 VITALS — BP 120/60 | HR 54 | Ht 70.6 in | Wt 193.1 lb

## 2024-03-22 DIAGNOSIS — I2510 Atherosclerotic heart disease of native coronary artery without angina pectoris: Secondary | ICD-10-CM | POA: Diagnosis not present

## 2024-03-22 DIAGNOSIS — I10 Essential (primary) hypertension: Secondary | ICD-10-CM

## 2024-03-22 DIAGNOSIS — E782 Mixed hyperlipidemia: Secondary | ICD-10-CM

## 2024-03-22 NOTE — Progress Notes (Signed)
 Cardiology Office Note:    Date:  03/22/2024   ID:  Robert Krause, DOB [birth year], MRN [id number]  PCP:  Ezell Hollow, MD  Cardiologist:  Nelia Balzarine, MD   Referring MD: Ezell Hollow, MD    ASSESSMENT:    1. Mild CAD   2. Essential hypertension   3. Mixed hyperlipidemia    PLAN:    In order of problems listed above:  Coronary artery disease: Secondary prevention stressed with the patient.  Importance of compliance with diet medications stressed and he vocalized understanding.  He was advised to walk at least half an hour a day on a daily basis. Essential hypertension: Blood pressure is stable and diet was emphasized.  Lifestyle modification urged. Mixed dyslipidemia: On lipid-lowering medications diet emphasized and he promises to do better. Patient will be seen in follow-up appointment in 9 months or earlier if the patient has any concerns.    Medication Adjustments/Labs and Tests Ordered: Current medicines are reviewed at length with the patient today.  Concerns regarding medicines are outlined above.  No orders of the defined types were placed in this encounter.  No orders of the defined types were placed in this encounter.    No chief complaint on file.    History of Present Illness:    Robert Krause is a 89 y.o. male patient with past medical history of coronary artery disease, essential hypertension and mixed dyslipidemia.  He denies any problems at this time and takes care of activities of daily living.  No chest pain orthopnea or PND.  He just returned from his trip to [place] for 2 months.  At the time of my evaluation, the patient is alert awake oriented and in no distress.  Past Medical History:  Diagnosis Date   ACTINIC KERATOSIS, CHEEK, LEFT 08/28/2009   Qualifier: Diagnosis of  By: Larrie Po MD, John E    Adenomatous polyp of colon 10/2005   ALLERGIC RHINITIS 09/28/2007   Qualifier: Diagnosis of  By: Larrie Po MD, John E    Allergic rhinitis  09/28/2007   Qualifier: Diagnosis of   By: Larrie Po MD, Wilmon Hashimoto     IMO SNOMED Dx Update Oct 2024     Annual physical exam >>>>>>>>>>>>>>>>>>> 07/18/2014   Anxiety    BPH (benign prostatic hyperplasia)    Carpal tunnel syndrome, left upper limb 01/01/2021   Chronic interstitial cystitis    Dr Luster Salters   Chronic nausea 11/12/2020   Colon polyp 04/14/2011   Contracture of palmar fascia 10/16/2010   Qualifier: Diagnosis of  By: Larrie Po MD, John E    COVID 05/2022   mild   CTS (carpal tunnel syndrome) 01/27/2017   DOE (dyspnea on exertion) 08/06/2023   Essential hypertension 09/28/2007   Qualifier: Diagnosis of  By: Larrie Po MD, Wilmon Hashimoto    FINGER SUP FB W/O MAJ OPEN WOUND&W/O MENTION INF 02/11/2008   Qualifier: Diagnosis of  By: Larrie Po MD, Wilmon Hashimoto    GERD (gastroesophageal reflux disease)    Glaucoma    Hyperlipidemia    Insomnia 06/10/2023   Mild CAD 11/11/2023   Osteoarthritis 09/28/2007   Qualifier: Diagnosis of   By: Larrie Po MD, Wilmon Hashimoto     IMO SNOMED Dx Update Oct 2024     Other chronic sinusitis 09/28/2007   Qualifier: Diagnosis of  By: Larrie Po MD, Wilmon Hashimoto    PCP NOTES >>>>>> 11/08/2015   PROSTATITIS, ACUTE, CHRONIC 03/26/2009   Qualifier: Diagnosis of  By: Larrie Po MD, Autry Legions  E    Rib pain on left side 08/13/2022   SCC (squamous cell carcinoma)    pt denies   Venous [place]    right inferior clavicle, shave   Vertigo 01/27/2024    Past Surgical History:  Procedure Laterality Date   APPENDECTOMY     CARPAL TUNNEL RELEASE Right 12/23/2022   Procedure: RIGHT CARPAL TUNNEL RELEASE;  Surgeon: Diedra Fowler, MD;  Location: [facility] OR;  Service: Orthopedics;  Laterality: Right;   CATARACT EXTRACTION Right 06-2015   COLONOSCOPY     POLYPECTOMY     TRANSURETHRAL RESECTION OF PROSTATE     UPPER GASTROINTESTINAL ENDOSCOPY      Current Medications: Current Meds  Medication Sig   acetaminophen  (TYLENOL ) 500 MG tablet Take 1 tablet (500 mg total) by mouth every 6 (six) hours as needed for  moderate pain or mild pain.   ALPRAZolam  (XANAX ) 0.25 MG tablet Take 0.25-0.5 mg by mouth at bedtime as needed for anxiety.   irbesartan -hydrochlorothiazide  (AVALIDE) 150-12.5 MG tablet Take 1 tablet by mouth daily.   latanoprost (XALATAN) 0.005 % ophthalmic solution Place 1 drop into both eyes at bedtime.   meclizine  (ANTIVERT ) 25 MG tablet Take 1 tablet (25 mg total) by mouth 3 (three) times daily as needed for dizziness.   pravastatin  (PRAVACHOL ) 10 MG tablet Take 1 tablet (10 mg total) by mouth at bedtime.   promethazine  (PHENERGAN ) 12.5 MG tablet Take 1 tablet (12.5 mg total) by mouth 2 (two) times daily as needed for nausea or vomiting.   Tamsulosin HCl (FLOMAX) 0.4 MG CAPS Take 0.4 mg by mouth at bedtime.     Allergies:   Patient has no known allergies.   Social History   Socioeconomic History   Marital status: Married    Spouse name: Not on file   Number of children: 1   Years of education: Not on file   Highest education level: Not on file  Occupational History   Occupation: retired- Sales executive: RETIRED  Tobacco Use   Smoking status: Never    Passive exposure: Never   Smokeless tobacco: Never  Vaping Use   Vaping status: Never Used  Substance and Sexual Activity   Alcohol use: Yes    Alcohol/week: 3.0 standard drinks of alcohol    Types: 3 Glasses of wine per week    Comment: wine   Drug use: No   Sexual activity: Not on file  Other Topics Concern   Not on file  Social History Narrative   Born in [place], raised in [place], moved to [place]  in the 60s   Household: pt and wife   Son lives in the [place]    Social Drivers of Health   Financial Resource Strain: Low Risk  (08/24/2020)   Overall Financial Resource Strain (CARDIA)    Difficulty of Paying Living Expenses: Not very hard  Food Insecurity: Low Risk  (08/25/2023)   Received from [facility]   Hunger Vital Sign    Worried About Running Out of Food in the Last Year: Never true    Ran Out of Food in the Last  Year: Never true  Transportation Needs: No Transportation Needs (08/25/2023)   Received from [facility]    In the past 12 months, has lack of reliable transportation kept you from medical appointments, meetings, work or from getting things needed for daily living? : No  Physical Activity: Not on file  Stress: Not on file  Social Connections:  Not on file     Family History: The patient's family history includes Anxiety disorder in an other family member; CAD in his father; Hyperlipidemia in his father; Hypertension in his father. There is no history of Colon cancer, Prostate cancer, Esophageal cancer, Rectal cancer, or Stomach cancer.  ROS:   Please see the history of present illness.    All other systems reviewed and are negative.  EKGs/Labs/Other Studies Reviewed:    The following studies were reviewed today: .Aaron Aas   I discussed my findings with the patient at length   Recent Labs: 05/04/2023: TSH 2.06 09/29/2023: ALT 11 03/08/2024: BUN 15; Creatinine, Ser 0.98; Hemoglobin 14.5; Platelets 220.0; Potassium 4.2; Sodium 130  Recent Lipid Panel    Component Value Date/Time   CHOL 149 09/29/2023 0833   TRIG 75 09/29/2023 0833   TRIG 55 10/05/2006 1439   HDL 55 09/29/2023 0833   CHOLHDL 2.7 09/29/2023 0833   CHOLHDL 3 12/09/2022 0840   VLDL 14.0 12/09/2022 0840   LDLCALC 79 09/29/2023 0833   LDLDIRECT 153.4 06/06/2013 1156    Physical Exam:    VS:  BP 120/60   Pulse (!) 54   Ht 5' 10.6" (1.793 m)   Wt 193 lb 1.3 oz (87.6 kg)   SpO2 97%   BMI 27.24 kg/m     Wt Readings from Last 3 Encounters:  03/22/24 193 lb 1.3 oz (87.6 kg)  03/08/24 194 lb 2 oz (88.1 kg)  01/26/24 195 lb 2 oz (88.5 kg)     GEN: Patient is in no acute distress HEENT: Normal NECK: No JVD; No carotid bruits LYMPHATICS: No lymphadenopathy CARDIAC: Hear sounds regular, 2/6 systolic murmur at the apex. RESPIRATORY:  Clear to auscultation without rales, wheezing or rhonchi  ABDOMEN:  Soft, non-tender, non-distended MUSCULOSKELETAL:  No edema; No deformity  SKIN: Warm and dry NEUROLOGIC:  Alert and oriented x 3 PSYCHIATRIC:  Normal affect   Signed, Nelia Balzarine, MD  03/22/2024 8:31 AM    [facility]

## 2024-03-22 NOTE — Patient Instructions (Signed)
 Medication Instructions:  Your physician recommends that you continue on your current medications as directed. Please refer to the Current Medication list given to you today.  *If you need a refill on your cardiac medications before your next appointment, please call your pharmacy*  Lab Work: None If you have labs (blood work) drawn today and your tests are completely normal, you will receive your results only by: MyChart Message (if you have MyChart) OR A paper copy in the mail If you have any lab test that is abnormal or we need to change your treatment, we will call you to review the results.  Testing/Procedures: None  Follow-Up: At [facility], you and your health needs are our priority.  As part of our continuing mission to provide you with exceptional heart care, our providers are all part of one team.  This team includes your primary Cardiologist (physician) and Advanced Practice Providers or APPs (Physician Assistants and Nurse Practitioners) who all work together to provide you with the care you need, when you need it.  Your next appointment:   9 month(s)  Provider:   Belva Crome, MD    We recommend signing up for the patient portal called "MyChart".  Sign up information is provided on this After Visit Summary.  MyChart is used to connect with patients for Virtual Visits (Telemedicine).  Patients are able to view lab/test results, encounter notes, upcoming appointments, etc.  Non-urgent messages can be sent to your provider as well.   To learn more about what you can do with MyChart, go to [email or web address].   Other Instructions None

## 2024-03-28 NOTE — Progress Notes (Signed)
 Chief Complaint: Primary GI MD:  HPI:  *** is a  ***  who was referred to me by Ezell Hollow, MD for a complaint of *** .     Discussed the use of AI scribe software for clinical note transcription with the patient, who gave verbal consent to proceed.  History of Present Illness      PREVIOUS GI WORKUP   EGD 05/15/2023 - Normal esophagus.  - Erosive gastropathy with no bleeding and no stigmata of recent bleeding. Biopsied.  - Normal duodenal bulb and second portion of the duodenum.  Diagnosis Surgical [P], gastric antrum and body gastritis REACTIVE GASTROPATHY WITH FOCAL SURFACE EROSION NEGATIVE FOR H. PYLORI, INTESTINAL METAPLASIA, DYSPLASIA AND CARCINOMA   EGD Dec 2020 - Normal esophagus.  - Erosive gastropathy with no bleeding and no stigmata of recent bleeding. Biopsied.  - Normal duodenal bulb and second portion of the duodenum.  Colonoscopy 12/2017 - One 5 mm polyp in the transverse colon, removed with a cold biopsy forceps. Resected and retrieved.  - Three 6 to 8 mm polyps in the sigmoid colon, in the descending colon and in the cecum, removed with a cold snare. Resected and retrieved.  - Mild diverticulosis in the left colon. There was no evidence of diverticular bleeding.  - The examination was otherwise normal on direct and retroflexion views.  Diagnosis Surgical [P], cecum, transverse, descending, sigmoid, polyp (4) - TUBULAR ADENOMA (4 OF 5 FRAGMENTS) - BENIGN COLONIC MUCOSA (1 OF 5 FRAGMENTS) - NO HIGH GRADE DYSPLASIA OR MALIGNANCY IDENTIFIED  Past Medical History:  Diagnosis Date   ACTINIC KERATOSIS, CHEEK, LEFT 08/28/2009   Qualifier: Diagnosis of  By: Larrie Po MD, John E    Adenomatous polyp of colon 10/2005   ALLERGIC RHINITIS 09/28/2007   Qualifier: Diagnosis of  By: Larrie Po MD, John E    Allergic rhinitis 09/28/2007   Qualifier: Diagnosis of   By: Larrie Po MD, Wilmon Hashimoto     IMO SNOMED Dx Update Oct 2024     Annual physical exam >>>>>>>>>>>>>>>>>>>  07/18/2014   Anxiety    BPH (benign prostatic hyperplasia)    Carpal tunnel syndrome, left upper limb 01/01/2021   Chronic interstitial cystitis    Dr Luster Salters   Chronic nausea 11/12/2020   Colon polyp 04/14/2011   Contracture of palmar fascia 10/16/2010   Qualifier: Diagnosis of  By: Larrie Po MD, John E    COVID 05/2022   mild   CTS (carpal tunnel syndrome) 01/27/2017   DOE (dyspnea on exertion) 08/06/2023   Essential hypertension 09/28/2007   Qualifier: Diagnosis of  By: Larrie Po MD, Wilmon Hashimoto    FINGER SUP FB W/O MAJ OPEN WOUND&W/O MENTION INF 02/11/2008   Qualifier: Diagnosis of  By: Larrie Po MD, Wilmon Hashimoto    GERD (gastroesophageal reflux disease)    Glaucoma    Hyperlipidemia    Insomnia 06/10/2023   Mild CAD 11/11/2023   Osteoarthritis 09/28/2007   Qualifier: Diagnosis of   By: Larrie Po MD, Wilmon Hashimoto     IMO SNOMED Dx Update Oct 2024     Other chronic sinusitis 09/28/2007   Qualifier: Diagnosis of  By: Larrie Po MD, Wilmon Hashimoto    PCP NOTES >>>>>> 11/08/2015   PROSTATITIS, ACUTE, CHRONIC 03/26/2009   Qualifier: Diagnosis of  By: Larrie Po MD, John E    Rib pain on left side 08/13/2022   SCC (squamous cell carcinoma)    pt denies   [place]ous [place]    right inferior clavicle, shave  Vertigo 01/27/2024    Past Surgical History:  Procedure Laterality Date   APPENDECTOMY     CARPAL TUNNEL RELEASE Right 12/23/2022   Procedure: RIGHT CARPAL TUNNEL RELEASE;  Surgeon: Diedra Fowler, MD;  Location: [facility] OR;  Service: Orthopedics;  Laterality: Right;   CATARACT EXTRACTION Right 06-2015   COLONOSCOPY     POLYPECTOMY     TRANSURETHRAL RESECTION OF PROSTATE     UPPER GASTROINTESTINAL ENDOSCOPY      Current Outpatient Medications  Medication Sig Dispense Refill   acetaminophen  (TYLENOL ) 500 MG tablet Take 1 tablet (500 mg total) by mouth every 6 (six) hours as needed for moderate pain or mild pain. 40 tablet 0   ALPRAZolam  (XANAX ) 0.25 MG tablet Take 0.25-0.5 mg by mouth at bedtime as needed for  anxiety.     irbesartan -hydrochlorothiazide  (AVALIDE) 150-12.5 MG tablet Take 1 tablet by mouth daily. 90 tablet 1   latanoprost (XALATAN) 0.005 % ophthalmic solution Place 1 drop into both eyes at bedtime.     meclizine  (ANTIVERT ) 25 MG tablet Take 1 tablet (25 mg total) by mouth 3 (three) times daily as needed for dizziness. 20 tablet 0   pantoprazole  (PROTONIX ) 40 MG tablet Take 1 tablet (40 mg total) by mouth daily. (Patient not taking: Reported on 03/22/2024) 90 tablet 3   pravastatin  (PRAVACHOL ) 10 MG tablet Take 1 tablet (10 mg total) by mouth at bedtime. 90 tablet 1   promethazine  (PHENERGAN ) 12.5 MG tablet Take 1 tablet (12.5 mg total) by mouth 2 (two) times daily as needed for nausea or vomiting. 60 tablet 0   Tamsulosin HCl (FLOMAX) 0.4 MG CAPS Take 0.4 mg by mouth at bedtime.     No current facility-administered medications for this visit.    Allergies as of 03/29/2024   (No Known Allergies)    Family History  Problem Relation Age of Onset   Anxiety disorder Other    Hyperlipidemia Father    Hypertension Father    CAD Father    Colon cancer Neg Hx    Prostate cancer Neg Hx    Esophageal cancer Neg Hx    Rectal cancer Neg Hx    Stomach cancer Neg Hx     Social History   Socioeconomic History   Marital status: Married    Spouse name: Not on file   Number of children: 1   Years of education: Not on file   Highest education level: Not on file  Occupational History   Occupation: retired- Sales executive: RETIRED  Tobacco Use   Smoking status: Never    Passive exposure: Never   Smokeless tobacco: Never  Vaping Use   Vaping status: Never Used  Substance and Sexual Activity   Alcohol use: Yes    Alcohol/week: 3.0 standard drinks of alcohol    Types: 3 Glasses of wine per week    Comment: wine   Drug use: No   Sexual activity: Not on file  Other Topics Concern   Not on file  Social History Narrative   Born in [place], raised in [place], moved to [place]  in the 60s    Household: pt and wife   Son lives in the [place]    Social Drivers of Health   Financial Resource Strain: Low Risk  (08/24/2020)   Overall Financial Resource Strain (CARDIA)    Difficulty of Paying Living Expenses: Not very hard  Food Insecurity: Low Risk  (08/25/2023)   Received from [facility]   Hunger  Vital Sign    Worried About Programme researcher, broadcasting/film/video in the Last Year: Never true    Ran Out of Food in the Last Year: Never true  Transportation Needs: No Transportation Needs (08/25/2023)   Received from [facility]    In the past 12 months, has lack of reliable transportation kept you from medical appointments, meetings, work or from getting things needed for daily living? : No  Physical Activity: Not on file  Stress: Not on file  Social Connections: Not on file  Intimate Partner Violence: Not on file    Review of Systems:    Constitutional: No weight loss, fever, chills, weakness or fatigue HEENT: Eyes: No change in vision               Ears, Nose, Throat:  No change in hearing or congestion Skin: No rash or itching Cardiovascular: No chest pain, chest pressure or palpitations   Respiratory: No SOB or cough Gastrointestinal: See HPI and otherwise negative Genitourinary: No dysuria or change in urinary frequency Neurological: No headache, dizziness or syncope Musculoskeletal: No new muscle or joint pain Hematologic: No bleeding or bruising Psychiatric: No history of depression or anxiety    Physical Exam:  Vital signs: There were no vitals taken for this visit.  Constitutional: NAD, alert and cooperative Head:  Normocephalic and atraumatic. Eyes:   PEERL, EOMI. No icterus. Conjunctiva pink. Respiratory: Respirations even and unlabored. Lungs clear to auscultation bilaterally.   No wheezes, crackles, or rhonchi.  Cardiovascular:  Regular rate and rhythm. No peripheral edema, cyanosis or pallor.  Gastrointestinal:  Soft, nondistended, nontender. No rebound  or guarding. Normal bowel sounds. No appreciable masses or hepatomegaly. Rectal:  Declines Msk:  Symmetrical without gross deformities. Without edema, no deformity or joint abnormality.  Neurologic:  Alert and  oriented x4;  grossly normal neurologically.  Skin:   Dry and intact without significant lesions or rashes. Psychiatric: Oriented to person, place and time. Demonstrates good judgement and reason without abnormal affect or behaviors.  Physical Exam    RELEVANT LABS AND IMAGING: CBC    Component Value Date/Time   WBC 5.3 03/08/2024 0821   RBC 4.52 03/08/2024 0821   HGB 14.5 03/08/2024 0821   HCT 42.8 03/08/2024 0821   PLT 220.0 03/08/2024 0821   MCV 94.7 03/08/2024 0821   MCH 31.8 01/27/2022 2000   MCHC 33.9 03/08/2024 0821   RDW 13.0 03/08/2024 0821   LYMPHSABS 1.8 03/08/2024 0821   MONOABS 0.6 03/08/2024 0821   EOSABS 0.1 03/08/2024 0821   BASOSABS 0.1 03/08/2024 0821    CMP     Component Value Date/Time   NA 130 (L) 03/08/2024 0821   NA 137 09/29/2023 0833   K 4.2 03/08/2024 0821   CL 96 03/08/2024 0821   CO2 29 03/08/2024 0821   GLUCOSE 110 (H) 03/08/2024 0821   GLUCOSE 101 (H) 10/05/2006 1439   BUN 15 03/08/2024 0821   BUN 12 09/29/2023 0833   CREATININE 0.98 03/08/2024 0821   CALCIUM 8.9 03/08/2024 0821   PROT 6.5 09/29/2023 0833   ALBUMIN 4.0 09/29/2023 0833   AST 17 09/29/2023 0833   ALT 11 09/29/2023 0833   ALKPHOS 67 09/29/2023 0833   BILITOT 0.8 09/29/2023 0833   GFRNONAA >60 01/27/2022 2000   GFRAA 85 10/24/2008 0000     Assessment/Plan:   Assessment and Plan Assessment & Plan        [facility] 03/28/2024, 12:54 PM  Cc: Ezell Hollow, MD

## 2024-03-29 ENCOUNTER — Ambulatory Visit: Admitting: Gastroenterology

## 2024-03-29 ENCOUNTER — Encounter: Payer: Self-pay | Admitting: Gastroenterology

## 2024-03-29 VITALS — BP 130/68 | HR 62 | Ht 71.0 in | Wt 191.0 lb

## 2024-03-29 DIAGNOSIS — R110 Nausea: Secondary | ICD-10-CM | POA: Diagnosis not present

## 2024-03-29 DIAGNOSIS — K5909 Other constipation: Secondary | ICD-10-CM

## 2024-03-29 DIAGNOSIS — R1011 Right upper quadrant pain: Secondary | ICD-10-CM

## 2024-03-29 NOTE — Patient Instructions (Signed)
 Start taking Miralax 1 capful (17 grams) 1x / day for 1 week.   If this is not effective, increase to 1 dose 2x / day for 1 week.   If this is still not effective, increase to two capfuls (34 grams) 2x / day.   Can adjust dose as needed based on response. Can take 1/2 cap daily, skip days, or increase per day.    You have been scheduled for an abdominal ultrasound at [facility] (1st floor of hospital) on 04/07/24 at 9:30 am. Please arrive 30 minutes prior to your appointment for registration. Make certain not to have anything to eat or drink after midnight. Should you need to reschedule your appointment, please contact radiology at [phone]. This test typically takes about 30 minutes to perform.  _______________________________________________________  If your blood pressure at your visit was 140/90 or greater, please contact your primary care physician to follow up on this.  _______________________________________________________  If you are age 75 or older, your body mass index should be between 23-30. Your Body mass index is 26.64 kg/m. If this is out of the aforementioned range listed, please consider follow up with your Primary Care Provider.  If you are age 27 or younger, your body mass index should be between 19-25. Your Body mass index is 26.64 kg/m. If this is out of the aformentioned range listed, please consider follow up with your Primary Care Provider.   ________________________________________________________  The [facility] providers would like to encourage you to use MYCHART to communicate with providers for non-urgent requests or questions.  Due to long hold times on the telephone, sending your provider a message by [facility] may be a faster and more efficient way to get a response.  Please allow 48 business hours for a response.  Please remember that this is for non-urgent requests.  _______________________________________________________

## 2024-04-04 NOTE — Progress Notes (Signed)
 Addendum: Reviewed and agree with assessment and management plan. Asha Grumbine, Carie Caddy, MD

## 2024-04-07 ENCOUNTER — Ambulatory Visit (HOSPITAL_COMMUNITY)
Admission: RE | Admit: 2024-04-07 | Discharge: 2024-04-07 | Disposition: A | Discharge status: Home / Self Care | Source: Ambulatory Visit | Attending: Gastroenterology | Admitting: Gastroenterology

## 2024-04-07 ENCOUNTER — Ambulatory Visit: Payer: Self-pay | Admitting: Gastroenterology

## 2024-04-07 DIAGNOSIS — R110 Nausea: Secondary | ICD-10-CM | POA: Diagnosis not present

## 2024-04-07 DIAGNOSIS — R1011 Right upper quadrant pain: Secondary | ICD-10-CM | POA: Diagnosis not present

## 2024-04-08 NOTE — Telephone Encounter (Signed)
 Followed two attempted phone calls to pt by sending a portal message.

## 2024-04-29 DIAGNOSIS — Z87448 Personal history of other diseases of urinary system: Secondary | ICD-10-CM | POA: Diagnosis not present

## 2024-04-29 DIAGNOSIS — N3010 Interstitial cystitis (chronic) without hematuria: Secondary | ICD-10-CM | POA: Diagnosis not present

## 2024-04-29 DIAGNOSIS — M6289 Other specified disorders of muscle: Secondary | ICD-10-CM | POA: Diagnosis not present

## 2024-05-04 NOTE — Progress Notes (Signed)
 Chief Complaint: Follow-up Primary GI MD: Dr. Bridgett Camps  HPI: Discussed the use of AI scribe software for clinical note transcription with the patient, who gave verbal consent to proceed.  Robert Krause is an 89 year old male who presents with constipation and nausea.  He experiences chronic constipation with minimal relief from Miralax, which he has been taking twice daily. Despite this, bowel movements occur only once a day and are small and watery.  He continues to experience persistent nausea. He was previously on pantoprazole  once daily but no longer has the medication. He inquires about the timing of taking pantoprazole , which he was instructed to take 30 minutes before eating.  He had an ultrasound that showed normal results with no gallstones. However, he has not had a HIDA scan to assess gallbladder function.  No pain is reported.    PREVIOUS GI WORKUP   EGD 05/15/2023 - Normal esophagus.  - Erosive gastropathy with no bleeding and no stigmata of recent bleeding. Biopsied.  - Normal duodenal bulb and second portion of the duodenum.   Diagnosis Surgical [P], gastric antrum and body gastritis REACTIVE GASTROPATHY WITH FOCAL SURFACE EROSION NEGATIVE FOR H. PYLORI, INTESTINAL METAPLASIA, DYSPLASIA AND CARCINOMA     EGD Dec 2020 - Normal esophagus.  - Erosive gastropathy with no bleeding and no stigmata of recent bleeding. Biopsied.  - Normal duodenal bulb and second portion of the duodenum.   Colonoscopy 12/2017 - One 5 mm polyp in the transverse colon, removed with a cold biopsy forceps. Resected and retrieved.  - Three 6 to 8 mm polyps in the sigmoid colon, in the descending colon and in the cecum, removed with a cold snare. Resected and retrieved.  - Mild diverticulosis in the left colon. There was no evidence of diverticular bleeding.  - The examination was otherwise normal on direct and retroflexion views.   Diagnosis Surgical [P], cecum, transverse,  descending, sigmoid, polyp (4) - TUBULAR ADENOMA (4 OF 5 FRAGMENTS) - BENIGN COLONIC MUCOSA (1 OF 5 FRAGMENTS) - NO HIGH GRADE DYSPLASIA OR MALIGNANCY IDENTIFIED  Past Medical History:  Diagnosis Date   ACTINIC KERATOSIS, CHEEK, LEFT 08/28/2009   Qualifier: Diagnosis of  By: Larrie Po MD, John E    Adenomatous polyp of colon 10/2005   ALLERGIC RHINITIS 09/28/2007   Qualifier: Diagnosis of  By: Larrie Po MD, John E    Allergic rhinitis 09/28/2007   Qualifier: Diagnosis of   By: Larrie Po MD, Wilmon Hashimoto     IMO SNOMED Dx Update Oct 2024     Annual physical exam >>>>>>>>>>>>>>>>>>> 07/18/2014   Anxiety    BPH (benign prostatic hyperplasia)    Carpal tunnel syndrome, left upper limb 01/01/2021   Chronic interstitial cystitis    Dr Luster Salters   Chronic nausea 11/12/2020   Colon polyp 04/14/2011   Contracture of palmar fascia 10/16/2010   Qualifier: Diagnosis of  By: Larrie Po MD, John E    COVID 05/2022   mild   CTS (carpal tunnel syndrome) 01/27/2017   DOE (dyspnea on exertion) 08/06/2023   Essential hypertension 09/28/2007   Qualifier: Diagnosis of  By: Larrie Po MD, Wilmon Hashimoto    FINGER SUP FB W/O MAJ OPEN WOUND&W/O MENTION INF 02/11/2008   Qualifier: Diagnosis of  By: Larrie Po MD, Wilmon Hashimoto    GERD (gastroesophageal reflux disease)    Glaucoma    Hyperlipidemia    Insomnia 06/10/2023   Mild CAD 11/11/2023   Osteoarthritis 09/28/2007   Qualifier: Diagnosis of   By: Larrie Po MD,  Wilmon Hashimoto     IMO SNOMED Dx Update Oct 2024     Other chronic sinusitis 09/28/2007   Qualifier: Diagnosis of  By: Larrie Po MD, Wilmon Hashimoto    PCP NOTES >>>>>> 11/08/2015   PROSTATITIS, ACUTE, CHRONIC 03/26/2009   Qualifier: Diagnosis of  By: Larrie Po MD, John E    Rib pain on left side 08/13/2022   SCC (squamous cell carcinoma)    pt denies   Venous [place]    right inferior clavicle, shave   Vertigo 01/27/2024    Past Surgical History:  Procedure Laterality Date   APPENDECTOMY     CARPAL TUNNEL RELEASE Right 12/23/2022    Procedure: RIGHT CARPAL TUNNEL RELEASE;  Surgeon: Diedra Fowler, MD;  Location: [facility] OR;  Service: Orthopedics;  Laterality: Right;   CATARACT EXTRACTION Right 06-2015   COLONOSCOPY     POLYPECTOMY     TRANSURETHRAL RESECTION OF PROSTATE     UPPER GASTROINTESTINAL ENDOSCOPY      Current Outpatient Medications  Medication Sig Dispense Refill   acetaminophen  (TYLENOL ) 500 MG tablet Take 1 tablet (500 mg total) by mouth every 6 (six) hours as needed for moderate pain or mild pain. 40 tablet 0   ALPRAZolam  (XANAX ) 0.25 MG tablet Take 0.25-0.5 mg by mouth at bedtime as needed for anxiety.     irbesartan -hydrochlorothiazide  (AVALIDE) 150-12.5 MG tablet Take 1 tablet by mouth daily. 90 tablet 1   latanoprost (XALATAN) 0.005 % ophthalmic solution Place 1 drop into both eyes at bedtime.     meclizine  (ANTIVERT ) 25 MG tablet Take 1 tablet (25 mg total) by mouth 3 (three) times daily as needed for dizziness. 20 tablet 0   pravastatin  (PRAVACHOL ) 10 MG tablet Take 1 tablet (10 mg total) by mouth at bedtime. 90 tablet 1   promethazine  (PHENERGAN ) 12.5 MG tablet Take 1 tablet (12.5 mg total) by mouth 2 (two) times daily as needed for nausea or vomiting. 60 tablet 0   Tamsulosin HCl (FLOMAX) 0.4 MG CAPS Take 0.4 mg by mouth at bedtime.     pantoprazole  (PROTONIX ) 40 MG tablet Take 1 tablet (40 mg total) by mouth daily. (Patient not taking: Reported on 05/05/2024) 90 tablet 3   No current facility-administered medications for this visit.    Allergies as of 05/05/2024   (No Known Allergies)    Family History  Problem Relation Age of Onset   Anxiety disorder Other    Hyperlipidemia Father    Hypertension Father    CAD Father    Colon cancer Neg Hx    Prostate cancer Neg Hx    Esophageal cancer Neg Hx    Rectal cancer Neg Hx    Stomach cancer Neg Hx     Social History   Socioeconomic History   Marital status: Married    Spouse name: Not on file   Number of children: 1   Years of education:  Not on file   Highest education level: Not on file  Occupational History   Occupation: retired- Sales executive: RETIRED  Tobacco Use   Smoking status: Never    Passive exposure: Never   Smokeless tobacco: Never  Vaping Use   Vaping status: Never Used  Substance and Sexual Activity   Alcohol use: Yes    Alcohol/week: 3.0 standard drinks of alcohol    Types: 3 Glasses of wine per week    Comment: wine   Drug use: No   Sexual activity: Not on file  Other  Topics Concern   Not on file  Social History Narrative   Born in [place], raised in [place], moved [place]  in the 60s   Household: pt and wife   Son lives in the [place]    Social Drivers of Health   Financial Resource Strain: Low Risk  (08/24/2020)   Overall Financial Resource Strain (CARDIA)    Difficulty of Paying Living Expenses: Not very hard  Food Insecurity: Low Risk  (08/25/2023)   Received from [facility]   Hunger Vital Sign    Worried About Running Out of Food in the Last Year: Never true    Ran Out of Food in the Last Year: Never true  Transportation Needs: No Transportation Needs (08/25/2023)   Received from [facility]    In the past 12 months, has lack of reliable transportation kept you from medical appointments, meetings, work or from getting things needed for daily living? : No  Physical Activity: Not on file  Stress: Not on file  Social Connections: Not on file  Intimate Partner Violence: Not on file    Review of Systems:    Constitutional: No weight loss, fever, chills, weakness or fatigue HEENT: Eyes: No change in vision               Ears, Nose, Throat:  No change in hearing or congestion Skin: No rash or itching Cardiovascular: No chest pain, chest pressure or palpitations   Respiratory: No SOB or cough Gastrointestinal: See HPI and otherwise negative Genitourinary: No dysuria or change in urinary frequency Neurological: No headache, dizziness or syncope Musculoskeletal: No new  muscle or joint pain Hematologic: No bleeding or bruising Psychiatric: No history of depression or anxiety    Physical Exam:  Vital signs: BP (!) 98/58 (BP Location: Left Arm, Patient Position: Sitting, Cuff Size: Normal)   Pulse 62   Ht 5' 6 (1.676 m) Comment: Measured height without shoes  Wt 188 lb 6 oz (85.4 kg)   BMI 30.40 kg/m   Constitutional: NAD, alert and cooperative.  Appears significantly younger than stated age and is very active Head:  Normocephalic and atraumatic. Eyes:   PEERL, EOMI. No icterus. Conjunctiva pink. Respiratory: Respirations even and unlabored. Lungs clear to auscultation bilaterally.   No wheezes, crackles, or rhonchi.  Cardiovascular:  Regular rate and rhythm. No peripheral edema, cyanosis or pallor.  Gastrointestinal:  Soft, nondistended, nontender. No rebound or guarding. Normal bowel sounds. No appreciable masses or hepatomegaly. Rectal:  Declines Msk:  Symmetrical without gross deformities. Without edema, no deformity or joint abnormality.  Neurologic:  Alert and  oriented x4;  grossly normal neurologically.  Skin:   Dry and intact without significant lesions or rashes. Psychiatric: Oriented to person, place and time. Demonstrates good judgement and reason without abnormal affect or behaviors.   RELEVANT LABS AND IMAGING: CBC    Component Value Date/Time   WBC 5.3 03/08/2024 0821   RBC 4.52 03/08/2024 0821   HGB 14.5 03/08/2024 0821   HCT 42.8 03/08/2024 0821   PLT 220.0 03/08/2024 0821   MCV 94.7 03/08/2024 0821   MCH 31.8 01/27/2022 2000   MCHC 33.9 03/08/2024 0821   RDW 13.0 03/08/2024 0821   LYMPHSABS 1.8 03/08/2024 0821   MONOABS 0.6 03/08/2024 0821   EOSABS 0.1 03/08/2024 0821   BASOSABS 0.1 03/08/2024 0821    CMP     Component Value Date/Time   NA 130 (L) 03/08/2024 0821   NA 137 09/29/2023 4235  K 4.2 03/08/2024 0821   CL 96 03/08/2024 0821   CO2 29 03/08/2024 0821   GLUCOSE 110 (H) 03/08/2024 0821   GLUCOSE 101 (H)  10/05/2006 1439   BUN 15 03/08/2024 0821   BUN 12 09/29/2023 0833   CREATININE 0.98 03/08/2024 0821   CALCIUM 8.9 03/08/2024 0821   PROT 6.5 09/29/2023 0833   ALBUMIN 4.0 09/29/2023 0833   AST 17 09/29/2023 0833   ALT 11 09/29/2023 0833   ALKPHOS 67 09/29/2023 0833   BILITOT 0.8 09/29/2023 0833   GFRNONAA >60 01/27/2022 2000   GFRAA 85 10/24/2008 0000     Assessment/Plan:   Chronic nausea Chronic nausea associated with eating, unresponsive to pantoprazole  and Phenergan .  Normal gastric emptying study 2021.  Normal labs.  EGD with some mild H. pylori negative gastritis. RUQ US  unremarkable. On pantoprazole  40mg  once daily. Suspect multifactorial nausea from worsening constipation, gastritis, or even biliary dyskinesia. -- increase PPI to BID as a trial -- HIDA scan to rule out biliary dyskinesia -- optimize bowel regimen -- follow up 12 weeks   Chronic Constipation/IBS-C Chronic constipation with infrequent bowel movements, contributing to nausea. Tried fiber and magnesium with no improvement. Tried miralax BID with minimal improvement. Colonoscopy 2019 with 4 small tubular adenomas and no repeat recommended due to age. Continuing to have small volume stools, no significant straining -Trial of Linzess 145 mcg (samples provided) - Increase fiber, increase water, increase exercise - Follow-up 12 weeks  Iriana Artley [facility] 05/05/2024, 9:24 AM  Cc: Ezell Hollow, MD

## 2024-05-05 ENCOUNTER — Other Ambulatory Visit: Payer: Self-pay

## 2024-05-05 ENCOUNTER — Encounter: Payer: Self-pay | Admitting: Gastroenterology

## 2024-05-05 ENCOUNTER — Ambulatory Visit: Admitting: Gastroenterology

## 2024-05-05 VITALS — BP 98/58 | HR 62 | Ht 66.0 in | Wt 188.4 lb

## 2024-05-05 DIAGNOSIS — R110 Nausea: Secondary | ICD-10-CM

## 2024-05-05 DIAGNOSIS — K581 Irritable bowel syndrome with constipation: Secondary | ICD-10-CM

## 2024-05-05 DIAGNOSIS — K5909 Other constipation: Secondary | ICD-10-CM | POA: Diagnosis not present

## 2024-05-05 NOTE — Patient Instructions (Signed)
 We have sent the following medications to your pharmacy for you to pick up at your convenience: Linzess  You will be contacted by [facility] Scheduling in the next 2 days to arrange a Hida Scan  The number on your caller ID will be [phone], please answer when they call.  If you have not heard from them in 2 days please call [phone] to schedule.    Follow-up in October. Office will contact to schedule.   Thank you for choosing me and [facility].  Suzanna Erp , PA-C

## 2024-05-14 ENCOUNTER — Other Ambulatory Visit: Payer: Self-pay | Admitting: Internal Medicine

## 2024-05-19 ENCOUNTER — Other Ambulatory Visit: Payer: Self-pay | Admitting: Internal Medicine

## 2024-05-29 NOTE — Progress Notes (Signed)
 Addendum: Reviewed and agree with assessment and management plan. Last cross-sectional imaging was 5 years ago, low threshold to repeat abdominal and pelvic CT if symptoms persist despite recommendations at this visit Kezia Benevides, Gordy HERO, MD

## 2024-06-27 ENCOUNTER — Ambulatory Visit (INDEPENDENT_AMBULATORY_CARE_PROVIDER_SITE_OTHER): Admitting: Orthopedic Surgery

## 2024-06-27 ENCOUNTER — Telehealth: Payer: Self-pay

## 2024-06-27 ENCOUNTER — Ambulatory Visit (INDEPENDENT_AMBULATORY_CARE_PROVIDER_SITE_OTHER)

## 2024-06-27 DIAGNOSIS — M25511 Pain in right shoulder: Secondary | ICD-10-CM

## 2024-06-27 DIAGNOSIS — G8929 Other chronic pain: Secondary | ICD-10-CM

## 2024-06-27 NOTE — Progress Notes (Signed)
 Orthopedic Surgery Progress Note   Assessment: Patient is a 89 y.o. male with acute on chronic right shoulder pain   Plan: -No operative plans at this time -I recommended Tylenol  up to 1000 mg 3 times daily -I expect his pain to get better with time -If his pain is not getting better after six weeks, would workup further with an MRI -Weight bearing status: as tolerated -Return to office if symptoms do not improve  ___________________________________________________________________________  Subjective: Patient was in the office today with his wife.  He was attempting to get in the exam room with his wife when the door broke and fell on his right shoulder.  He noticed a small break in the skin over the superior aspect of the shoulder.  He also noticed worsening pain over the area where the break in the skin was.  He does have a history of shoulder pain with arthritis in the joint.  He said his shoulder feels very similar to how it has for years. Slight increase of pain over the area where the door hit him.  No neck pain.  No radiating arm pain.   Physical Exam:  General: no acute distress, appears stated age Neurologic: alert, answering questions appropriately, following commands Respiratory: unlabored breathing on room air, symmetric chest rise Psychiatric: appropriate affect, normal cadence to speech  MSK:   - Right upper extremity  Small break in the skin less than a centimeter at the superior aspect of the shoulder, no ecchymosis, no swelling seen, no gross deformity  Mild TTP over the area with the skin break, no other tenderness palpation over the shoulder  Decreased range of motion at the shoulder with forward flexion to 80 degrees, pain with range of motion past that point  Pain but no weakness with Jobe test, negative belly press, no weakness with external rotation with arm at side  AIN/PIN/IO intact  Palpable radial pulse  Sensation intact to light touch in  median/ulnar/radial/axillary nerve distributions  Hand warm and well perfused   Imaging: XRs of the right shoulder from 06/27/2024 were independently reviewed and interpreted, showing no fracture or dislocation.  There is significant glenohumeral arthritis with joint space narrowing and osteophyte formation.  There is also joint space narrowing within the acromioclavicular joint.   Patient name: Robert Krause Patient MRN: [id number] Date: 06/27/24

## 2024-06-29 ENCOUNTER — Encounter (HOSPITAL_COMMUNITY): Admission: RE | Admit: 2024-06-29 | Source: Ambulatory Visit

## 2024-07-05 ENCOUNTER — Encounter (HOSPITAL_COMMUNITY)
Admission: RE | Admit: 2024-07-05 | Discharge: 2024-07-05 | Disposition: A | Discharge status: Home / Self Care | Source: Ambulatory Visit | Attending: Gastroenterology | Admitting: Gastroenterology

## 2024-07-05 DIAGNOSIS — R110 Nausea: Secondary | ICD-10-CM | POA: Insufficient documentation

## 2024-07-05 DIAGNOSIS — K820 Obstruction of gallbladder: Secondary | ICD-10-CM | POA: Diagnosis not present

## 2024-07-05 DIAGNOSIS — R1011 Right upper quadrant pain: Secondary | ICD-10-CM | POA: Diagnosis not present

## 2024-07-05 MED ORDER — TECHNETIUM TC 99M MEBROFENIN IV KIT
5.4000 | PACK | Freq: Once | INTRAVENOUS | Status: AC
  Administered 2024-07-05 (×2): 5.4 via INTRAVENOUS

## 2024-07-06 ENCOUNTER — Ambulatory Visit: Payer: Self-pay | Admitting: Nurse Practitioner

## 2024-07-06 DIAGNOSIS — H6123 Impacted cerumen, bilateral: Secondary | ICD-10-CM | POA: Diagnosis not present

## 2024-07-06 MED ORDER — LINACLOTIDE 145 MCG PO CAPS: 145.0000 ug | ORAL_CAPSULE | Freq: Every day | ORAL | 2 refills | Status: DC

## 2024-07-15 ENCOUNTER — Encounter: Payer: Self-pay | Admitting: Internal Medicine

## 2024-07-15 ENCOUNTER — Ambulatory Visit (INDEPENDENT_AMBULATORY_CARE_PROVIDER_SITE_OTHER): Admitting: Internal Medicine

## 2024-07-15 VITALS — BP 138/80 | HR 70 | Temp 97.8°F | Resp 16 | Ht 66.0 in | Wt 193.5 lb

## 2024-07-15 DIAGNOSIS — R739 Hyperglycemia, unspecified: Secondary | ICD-10-CM

## 2024-07-15 DIAGNOSIS — R110 Nausea: Secondary | ICD-10-CM | POA: Diagnosis not present

## 2024-07-15 DIAGNOSIS — K5909 Other constipation: Secondary | ICD-10-CM | POA: Diagnosis not present

## 2024-07-15 DIAGNOSIS — I10 Essential (primary) hypertension: Secondary | ICD-10-CM

## 2024-07-15 DIAGNOSIS — E782 Mixed hyperlipidemia: Secondary | ICD-10-CM

## 2024-07-15 NOTE — Patient Instructions (Signed)
   Check the  blood pressure regularly Blood pressure goal:  between 110/65 and  135/85. If it is consistently higher or lower, let me know     GO TO THE LAB :  Get the blood work   Your results will be posted on MyChart with my comments  Go to the front desk for the checkout Please make an appointment for a checkup in 4 months

## 2024-07-15 NOTE — Progress Notes (Signed)
 Subjective:    Patient ID: Robert Krause, male    DOB: [birth year], 89 y.o.   MRN: [id number]  DOS:  07/15/2024 Type of visit - description: Follow-up  Chronic medical problems addressed Saw GI, continue with GI symptoms including postprandial nausea, right-sided abdominal pain, constipation.  Review of Systems See above   Past Medical History:  Diagnosis Date   ACTINIC KERATOSIS, CHEEK, LEFT 08/28/2009   Qualifier: Diagnosis of  By: Mavis MD, Robert Krause    Adenomatous polyp of colon 10/2005   ALLERGIC RHINITIS 09/28/2007   Qualifier: Diagnosis of  By: Mavis MD, Robert Krause    Allergic rhinitis 09/28/2007   Qualifier: Diagnosis of   By: Mavis MD, Robert Krause Robert Krause     IMO SNOMED Dx Update Oct 2024     Annual physical exam >>>>>>>>>>>>>>>>>>> 07/18/2014   Anxiety    BPH (benign prostatic hyperplasia)    Carpal tunnel syndrome, left upper limb 01/01/2021   Chronic interstitial cystitis    Dr Robert Krause   Chronic nausea 11/12/2020   Colon polyp 04/14/2011   Contracture of palmar fascia 10/16/2010   Qualifier: Diagnosis of  By: Mavis MD, Robert Krause    COVID 05/2022   mild   CTS (carpal tunnel syndrome) 01/27/2017   DOE (dyspnea on exertion) 08/06/2023   Essential hypertension 09/28/2007   Qualifier: Diagnosis of  By: Mavis MD, Robert Krause Robert Krause    FINGER SUP FB W/O MAJ OPEN WOUND&W/O MENTION INF 02/11/2008   Qualifier: Diagnosis of  By: Mavis MD, Robert Krause Robert Krause    GERD (gastroesophageal reflux disease)    Glaucoma    Hyperlipidemia    Insomnia 06/10/2023   Mild CAD 11/11/2023   Osteoarthritis 09/28/2007   Qualifier: Diagnosis of   By: Mavis MD, Robert Krause Robert Krause     IMO SNOMED Dx Update Oct 2024     Other chronic sinusitis 09/28/2007   Qualifier: Diagnosis of  By: Mavis MD, Robert Krause Robert Krause    PCP NOTES >>>>>> 11/08/2015   PROSTATITIS, ACUTE, CHRONIC 03/26/2009   Qualifier: Diagnosis of  By: Mavis MD, Robert Krause    Rib pain on left side 08/13/2022   SCC (squamous cell carcinoma)    pt denies   Venous lake    right  inferior clavicle, shave   Vertigo 01/27/2024    Past Surgical History:  Procedure Laterality Date   APPENDECTOMY     CARPAL TUNNEL RELEASE Right 12/23/2022   Procedure: RIGHT CARPAL TUNNEL RELEASE;  Surgeon: Robert Krause LABOR, MD;  Location: [facility] OR;  Service: Orthopedics;  Laterality: Right;   CATARACT EXTRACTION Right 06-2015   COLONOSCOPY     POLYPECTOMY     TRANSURETHRAL RESECTION OF PROSTATE     UPPER GASTROINTESTINAL ENDOSCOPY      Current Outpatient Medications  Medication Instructions   acetaminophen  (TYLENOL ) 500 mg, Oral, Every 6 hours PRN   ALPRAZolam  (XANAX ) 0.25-0.5 mg, At bedtime PRN   irbesartan -hydrochlorothiazide  (AVALIDE) 150-12.5 MG tablet 1 tablet, Oral, Daily   latanoprost (XALATAN) 0.005 % ophthalmic solution 1 drop, Daily at bedtime   meclizine  (ANTIVERT ) 25 mg, Oral, 3 times daily PRN   pantoprazole  (PROTONIX ) 40 mg, Oral, Daily   pravastatin  (PRAVACHOL ) 10 mg, Oral, Daily at bedtime   promethazine  (PHENERGAN ) 12.5 mg, Oral, 2 times daily PRN   tamsulosin (FLOMAX) 0.4 mg, Daily at bedtime       Objective:   Physical Exam BP 138/80   Pulse 70   Temp 97.8 F (36.6 C) (Oral)   Resp 16  Ht 5' 6 (1.676 m)   Wt 193 lb 8 oz (87.8 kg)   SpO2 95%   BMI 31.23 kg/m  General:   Well developed, NAD, BMI noted. HEENT:  Normocephalic . Face symmetric, atraumatic Lungs:  CTA B Normal respiratory effort, no intercostal retractions, no accessory muscle use. Heart: RRR,  no murmur.  Lower extremities: no pretibial edema bilaterally  Skin: Not pale. Not jaundice Neurologic:  alert & oriented X3.  Speech normal, gait appropriate for age and unassisted Psych--  Cognition and judgment appear intact.  Cooperative with normal attention span and concentration.  Behavior appropriate. No anxious or depressed appearing.      Assessment   Problem list Prediabetes HTN CV: --Bradycardia: Holter 2016 essentially negative --06/07/2019 dx was  DOE and  palpitations: ECHO unremarkable, nuclear stress test EF 45%, no ischemic changes -- Coronary CTA (08/2023) : Coronary Ca+ score 65, mild nonobstructive CAD Hyperlipidemia Anxiety: on xanax , stress related to interstitial cystitis, lexapro  intol (-2020) Insomnia  DJD  Glaucoma GU: Dr. Janit --BPH, TURP --Interstitial cystitis --- xanax  prn Venous [place], right inferior clavicle Chronic nausea-constipation: GB U/S 2017 (-),  saw GI, EGD 11-2018: Erosive gastritis, BX with no H. pylori, + reactive gastropathy.   CT abdomen 08-2019: Benign.  EGD 6/ 2024: Erosive gastropathy, BX reactive gastropathy. Vertigo  PLAN HTN: Reports good compliance w/avalide.  Ambulatory BPs in the 140s. BP today 138/80. Check BMP.  No change in management, at age 61 I do not like to cause hypotension.  High cholesterol: Controlled on pravastatin  Chronic nausea, chronic constipation: Saw GI, they noted unremarkable RUQ US , HIDA scan was borderline positive, for now they are electing to treat him with PPIs and Linzess . Patient reports he continue w/ postprandial nausea, constipation, unable to afford Linzess  taking MiraLAX daily with some help. Request referral to talk with a surgeon.  Advised that before I do that I will request the opinion of GI.  Also pointed out that at his age, surgery has significant risk. He states he understood that perfectly well. Addendum: Discussed with GI [name], NP, they will arrange for  CTAP with oral and IV contrast, blood work.  Will hold off on general surgery consult for now. Appreciate their help.   Right-sided chest pain: See my last visit, see above. Preventive care: Flu shot, COVID booster. RTC 4 to 5 months

## 2024-07-16 LAB — BASIC METABOLIC PANEL WITH GFR
BUN: 13 mg/dL (ref 7–25)
CO2: 29 mmol/L (ref 20–32)
Calcium: 8.9 mg/dL (ref 8.6–10.3)
Chloride: 96 mmol/L — ABNORMAL LOW (ref 98–110)
Creat: 1.05 mg/dL (ref 0.70–1.22)
Glucose, Bld: 103 mg/dL — ABNORMAL HIGH (ref 65–99)
Potassium: 4.5 mmol/L (ref 3.5–5.3)
Sodium: 131 mmol/L — ABNORMAL LOW (ref 135–146)
eGFR: 69 mL/min/1.73m2 (ref >=60)

## 2024-07-17 NOTE — Assessment & Plan Note (Signed)
 HTN: Reports good compliance w/avalide.  Ambulatory BPs in the 140s. BP today 138/80. Check BMP.  No change in management, at age 89 I do not like to cause hypotension.  High cholesterol: Controlled on pravastatin  Chronic nausea, chronic constipation: Saw GI, they noted unremarkable RUQ US , HIDA scan was borderline positive, for now they are electing to treat him with PPIs and Linzess . Patient reports he continue w/ postprandial nausea, constipation, unable to afford Linzess  taking MiraLAX daily with some help. Request referral to talk with a surgeon.  Advised that before I do that I will request the opinion of GI.  Also pointed out that at his age, surgery has significant risk. He states he understood that perfectly well. Addendum: Discussed with GI [name], NP, they will arrange for  CTAP with oral and IV contrast, blood work.  Will hold off on general surgery consult for now. Appreciate their help.   Right-sided chest pain: See my last visit, see above. Preventive care: Flu shot, COVID booster. RTC 4 to 5 months

## 2024-07-18 ENCOUNTER — Ambulatory Visit: Payer: Self-pay | Admitting: Internal Medicine

## 2024-07-18 ENCOUNTER — Telehealth: Payer: Self-pay | Admitting: Nurse Practitioner

## 2024-07-18 DIAGNOSIS — K5909 Other constipation: Secondary | ICD-10-CM

## 2024-07-18 DIAGNOSIS — R110 Nausea: Secondary | ICD-10-CM

## 2024-07-18 DIAGNOSIS — R1011 Right upper quadrant pain: Secondary | ICD-10-CM

## 2024-07-18 NOTE — Telephone Encounter (Signed)
 LLabs entered in EPIC to be completed at [facility].   Patient has been scheduled for CT abdomen/pelvis at [facility] on Thursday 07/28/24 at 2:30 pm with 2 pm arrival.  NPO 4 hours prior.

## 2024-07-18 NOTE — Telephone Encounter (Signed)
 Robert Krause, patient's PCP Dr. Aloysius Mech contacted me on Friday afternoon 8/22 and reported patient continues to have nausea and RUQ pain. Patient questioned about a general surgery consult.   Patient previously seen by Con Blower, PA-C 05/05/2024, Dr. Albertus is supervising GI physician.  Dottie, pls contact patient and let him know I communicated with Dr. Mech.  I recommend sending the patient to our lab for a CBC, hepatic panel, lipase and TSH ( last TSH was one year ago) in the setting of persistent nausea, RUQ pain and constipation.  I recommend the above labs and to schedule a CTAP with oral and IV contrast. He just had a BMP done 8/22 his BUN/creatinine levels were normal. His [facility] HIDA scan was vague with questionable gallbladder hypokinesis. Before referring the patient to general surgery, I recommend the above evaluation.  Dottie, these facilitate obtaining the above lab orders and schedule him for an abdominal/pelvic CT scan with oral and IV contrast.  Thank you.  Dr. Albertus LIPS

## 2024-07-18 NOTE — Telephone Encounter (Signed)
 Left message for patient to call back

## 2024-07-19 NOTE — Telephone Encounter (Signed)
 I have spoken to patient to advise of recommendations as per Elida Nyle Sharps, NP and of upcoming appointment for labs and CT. Patient verbalizes understanding.

## 2024-07-28 ENCOUNTER — Ambulatory Visit (HOSPITAL_COMMUNITY)
Admission: RE | Admit: 2024-07-28 | Discharge: 2024-07-28 | Disposition: A | Discharge status: Home / Self Care | Source: Ambulatory Visit | Attending: Nurse Practitioner | Admitting: Nurse Practitioner

## 2024-07-28 DIAGNOSIS — K5909 Other constipation: Secondary | ICD-10-CM | POA: Diagnosis not present

## 2024-07-28 DIAGNOSIS — K5730 Diverticulosis of large intestine without perforation or abscess without bleeding: Secondary | ICD-10-CM | POA: Diagnosis not present

## 2024-07-28 DIAGNOSIS — R1011 Right upper quadrant pain: Secondary | ICD-10-CM | POA: Insufficient documentation

## 2024-07-28 DIAGNOSIS — R110 Nausea: Secondary | ICD-10-CM | POA: Insufficient documentation

## 2024-07-28 MED ORDER — IOHEXOL 300 MG/ML  SOLN
100.0000 mL | Freq: Once | INTRAMUSCULAR | Status: AC | PRN
  Administered 2024-07-28: 100 mL via INTRAVENOUS

## 2024-07-29 ENCOUNTER — Ambulatory Visit: Admitting: Internal Medicine

## 2024-07-31 ENCOUNTER — Ambulatory Visit: Payer: Self-pay | Admitting: Nurse Practitioner

## 2024-08-04 ENCOUNTER — Telehealth: Payer: Self-pay | Admitting: Internal Medicine

## 2024-08-04 MED ORDER — COVID-19 MRNA VAC-TRIS(PFIZER) 30 MCG/0.3ML IM SUSY
0.3000 mL | PREFILLED_SYRINGE | Freq: Once | INTRAMUSCULAR | 0 refills | Status: AC
Start: 2024-08-04 — End: 2024-08-04

## 2024-08-04 NOTE — Telephone Encounter (Signed)
 Copied from CRM [id number]. Topic: Clinical - Medication Refill >> Aug 04, 2024 10:24 AM Maisie C wrote: Medication: COVID VACCINE  Has the patient contacted their pharmacy? Yes  This is the patient's preferred pharmacy:  [id number] - [place], [place] - [address] [place] [address] Phone: [phone] Fax: [phone]  Is this the correct pharmacy for this prescription? Yes If no, delete pharmacy and type the correct one.   Has the prescription been filled recently? No  Is the patient out of the medication? No  Has the patient been seen for an appointment in the last year OR does the patient have an upcoming appointment? YES  Can we respond through MyChart? Yes  Agent: Please be advised that Rx refills may take up to 3 business days. We ask that you follow-up with your pharmacy.

## 2024-08-04 NOTE — Addendum Note (Signed)
 Addended by: Cassidy Tabet D on: 08/04/2024 03:34 PM   Modules accepted: Orders

## 2024-08-10 ENCOUNTER — Other Ambulatory Visit (INDEPENDENT_AMBULATORY_CARE_PROVIDER_SITE_OTHER)

## 2024-08-10 DIAGNOSIS — R1011 Right upper quadrant pain: Secondary | ICD-10-CM | POA: Diagnosis not present

## 2024-08-10 DIAGNOSIS — R110 Nausea: Secondary | ICD-10-CM

## 2024-08-10 DIAGNOSIS — K5909 Other constipation: Secondary | ICD-10-CM | POA: Diagnosis not present

## 2024-08-10 LAB — LIPASE: Lipase: 4 U/L — ABNORMAL LOW (ref 11.0–59.0)

## 2024-08-10 LAB — CBC WITH DIFFERENTIAL/PLATELET
Basophils Absolute: 0.1 K/uL (ref 0.0–0.1)
Basophils Relative: 1.6 % (ref 0.0–3.0)
Eosinophils Absolute: 0.1 K/uL (ref 0.0–0.7)
Eosinophils Relative: 3.6 % (ref 0.0–5.0)
HCT: 42.4 % (ref 39.0–52.0)
Hemoglobin: 14.4 g/dL (ref 13.0–17.0)
Lymphocytes Relative: 26.6 % (ref 12.0–46.0)
Lymphs Abs: 1.1 K/uL (ref 0.7–4.0)
MCHC: 34 g/dL (ref 30.0–36.0)
MCV: 93.2 fl (ref 78.0–100.0)
Monocytes Absolute: 0.5 K/uL (ref 0.1–1.0)
Monocytes Relative: 13.2 % — ABNORMAL HIGH (ref 3.0–12.0)
Neutro Abs: 2.3 K/uL (ref 1.4–7.7)
Neutrophils Relative %: 55 % (ref 43.0–77.0)
Platelets: 181 K/uL (ref 150.0–400.0)
RBC: 4.55 Mil/uL (ref 4.22–5.81)
RDW: 13.1 % (ref 11.5–15.5)
WBC: 4.1 K/uL (ref 4.0–10.5)

## 2024-08-10 LAB — HEPATIC FUNCTION PANEL
ALT: 11 U/L (ref 0–53)
AST: 16 U/L (ref 0–37)
Albumin: 3.6 g/dL (ref 3.5–5.2)
Alkaline Phosphatase: 49 U/L (ref 39–117)
Bilirubin, Direct: 0.1 mg/dL (ref 0.0–0.3)
Total Bilirubin: 0.5 mg/dL (ref 0.2–1.2)
Total Protein: 6.6 g/dL (ref 6.0–8.3)

## 2024-08-10 LAB — TSH: TSH: 1.76 u[IU]/mL (ref 0.35–5.50)

## 2024-08-17 DIAGNOSIS — N3010 Interstitial cystitis (chronic) without hematuria: Secondary | ICD-10-CM | POA: Diagnosis not present

## 2024-08-17 DIAGNOSIS — M6289 Other specified disorders of muscle: Secondary | ICD-10-CM | POA: Diagnosis not present

## 2024-09-01 ENCOUNTER — Other Ambulatory Visit

## 2024-09-01 ENCOUNTER — Ambulatory Visit: Admitting: Physician Assistant

## 2024-09-01 ENCOUNTER — Encounter: Payer: Self-pay | Admitting: Physician Assistant

## 2024-09-01 VITALS — BP 116/70 | HR 60 | Ht 66.0 in | Wt 190.0 lb

## 2024-09-01 DIAGNOSIS — K2960 Other gastritis without bleeding: Secondary | ICD-10-CM

## 2024-09-01 DIAGNOSIS — R1011 Right upper quadrant pain: Secondary | ICD-10-CM

## 2024-09-01 DIAGNOSIS — K828 Other specified diseases of gallbladder: Secondary | ICD-10-CM

## 2024-09-01 DIAGNOSIS — K8689 Other specified diseases of pancreas: Secondary | ICD-10-CM

## 2024-09-01 DIAGNOSIS — R110 Nausea: Secondary | ICD-10-CM | POA: Diagnosis not present

## 2024-09-01 DIAGNOSIS — Z860101 Personal history of adenomatous and serrated colon polyps: Secondary | ICD-10-CM

## 2024-09-01 DIAGNOSIS — K5909 Other constipation: Secondary | ICD-10-CM | POA: Diagnosis not present

## 2024-09-01 DIAGNOSIS — E8889 Other specified metabolic disorders: Secondary | ICD-10-CM

## 2024-09-01 DIAGNOSIS — K5904 Chronic idiopathic constipation: Secondary | ICD-10-CM

## 2024-09-01 NOTE — Patient Instructions (Addendum)
 Your provider has requested that you go to the basement level for lab work before leaving today. Press B on the elevator. The lab is located at the first door on the left as you exit the elevator.  Increase Miralax to 2 capfuls daily at bedtime if needed for constipation.  You have been scheduled for an MRI at [facility] on 09/08/24. Your appointment time is 3:00 pm. Please arrive to admitting (at main entrance of the hospital) 30 minutes prior to your appointment time for registration purposes. Please make certain not to have anything to eat or drink after 11:00 am prior to your test. In addition, if you have any metal in your body, have a pacemaker or defibrillator, please be sure to let your ordering physician know. This test typically takes 45 minutes to 1 hour to complete. Should you need to reschedule, please call [phone] to do so.   Please follow up sooner if symptoms increase or worsen  Due to recent changes in healthcare laws, you may see the results of your imaging and laboratory studies on MyChart before your provider has had a chance to review them.  We understand that in some cases there may be results that are confusing or concerning to you. Not all laboratory results come back in the same time frame and the provider may be waiting for multiple results in order to interpret others.  Please give us  48 hours in order for your provider to thoroughly review all the results before contacting the office for clarification of your results.   Thank you for trusting me with your gastrointestinal care!   [name], PA-C _______________________________________________________  If your blood pressure at your visit was 140/90 or greater, please contact your primary care physician to follow up on this.  _______________________________________________________  If you are age 32 or older, your body mass index should be between 23-30. Your Body mass index is 30.67 kg/m. If this is out of  the aforementioned range listed, please consider follow up with your Primary Care Provider.  If you are age 60 or younger, your body mass index should be between 19-25. Your Body mass index is 30.67 kg/m. If this is out of the aformentioned range listed, please consider follow up with your Primary Care Provider.   ________________________________________________________  The [facility] providers would like to encourage you to use MYCHART to communicate with providers for non-urgent requests or questions.  Due to long hold times on the telephone, sending your provider a message by [facility] may be a faster and more efficient way to get a response.  Please allow 48 business hours for a response.  Please remember that this is for non-urgent requests.  _______________________________________________________

## 2024-09-01 NOTE — Progress Notes (Unsigned)
 [name], PA-C [address] [place], [place]  [address] Phone: [phone]   Primary Care Physician: [name], MD  Primary Gastroenterologist:  [name], PA-C / Dr. Gordy Starch   Chief Complaint: Follow-up RUQ pain, nausea, and vomiting.      HPI:   Robert Krause is a 89 y.o. male returns for follow-up of chronic nausea and constipation.  Previous history of erosive gastritis (H. pylori negative), and adenomatous colon polyps.  He is here today with his wife.  Current symptoms: Patient states he has had chronic worsening postprandial RUQ pain with nausea for 1 year.  Getting worse.  He has had a few episodes of vomiting.  He is currently taking pantoprazole  40 mg once daily.  He denies heartburn, dysphagia, or epigastric pain.  Has history of constipation, however he is taking MiraLAX every day with good control.  Currently having bowel movement every morning.  Denies any recent constipation.  He has had some weight loss in the past year due to his persistent postprandial RUQ pain and nausea.  Gallbladder ultrasound was negative for gallstones.  Recent HIDA scan was concerning for biliary dyskinesia.  08/10/24 Labs: Normal CBC and hepatic panel.  Lipase has been chronically low.  Lipase 4.0.  07/31/2024 abdominal pelvic CT with contrast: - No acute findings within the abdomen or pelvis. - Diffuse fatty replacement of the pancreas with no inflammation. - Colonic diverticulosis, without radiographic evidence of diverticulitis. - Stable large right scrotal hydrocele.  07/05/2024 HIDA scan: Cystic and common bile ducts patent.  Gallbladder ejection fraction 35% with limited gallbladder contraction and borderline decreased ejection fraction, suspicious for biliary dyskinesia.  03/2024 abdominal ultrasound: No gallstones.  Normal liver.   PREVIOUS GI WORKUP    EGD 05/15/2023 - Normal esophagus.  - Erosive gastritis with no bleeding and no stigmata of recent bleeding.  Biopsies negative for H. Pylori, metaplasia, or dysplasia.  - Normal duodenal bulb and second portion of the duodenum.   EGD Dec 2020 - Normal esophagus.  - Erosive gastropathy with no bleeding and no stigmata of recent bleeding.  - Normal duodenal bulb and second portion of the duodenum.   Colonoscopy 12/2017: 4 small (5 mm to 8 mm) polyps removed.  Pathology showed tubular adenoma and colon mucosal polyps mild diverticulosis.  Otherwise normal.  No repeat due to advanced age.     Current Outpatient Medications  Medication Sig Dispense Refill   acetaminophen  (TYLENOL ) 500 MG tablet Take 1 tablet (500 mg total) by mouth every 6 (six) hours as needed for moderate pain or mild pain. 40 tablet 0   ALPRAZolam  (XANAX ) 0.25 MG tablet Take 0.25-0.5 mg by mouth at bedtime as needed for anxiety.     irbesartan -hydrochlorothiazide  (AVALIDE) 150-12.5 MG tablet Take 1 tablet by mouth daily. 90 tablet 1   latanoprost (XALATAN) 0.005 % ophthalmic solution Place 1 drop into both eyes at bedtime.     meclizine  (ANTIVERT ) 25 MG tablet Take 1 tablet (25 mg total) by mouth 3 (three) times daily as needed for dizziness. 20 tablet 0   pantoprazole  (PROTONIX ) 40 MG tablet Take 1 tablet (40 mg total) by mouth daily. 90 tablet 3   polyethylene glycol (MIRALAX / GLYCOLAX) 17 g packet Take 17 g by mouth daily.     pravastatin  (PRAVACHOL ) 10 MG tablet Take 1 tablet (10 mg total) by mouth at bedtime. 90 tablet 1   Tamsulosin HCl (FLOMAX) 0.4 MG CAPS Take 0.4 mg  by mouth at bedtime.     No current facility-administered medications for this visit.    Allergies as of 09/01/2024   (No Known Allergies)    Past Medical History:  Diagnosis Date   ACTINIC KERATOSIS, CHEEK, LEFT 08/28/2009   Qualifier: Diagnosis of  By: Mavis MD, John E    Adenomatous polyp of colon 10/2005   ALLERGIC RHINITIS 09/28/2007   Qualifier: Diagnosis of  By: Mavis MD, John E    Allergic rhinitis 09/28/2007   Qualifier: Diagnosis of    By: Mavis MD, Norleen BRAVO     IMO SNOMED Dx Update Oct 2024     Annual physical exam >>>>>>>>>>>>>>>>>>> 07/18/2014   Anxiety    BPH (benign prostatic hyperplasia)    Carpal tunnel syndrome, left upper limb 01/01/2021   Chronic interstitial cystitis    Dr Janit   Chronic nausea 11/12/2020   Colon polyp 04/14/2011   Contracture of palmar fascia 10/16/2010   Qualifier: Diagnosis of  By: Mavis MD, John E    COVID 05/2022   mild   CTS (carpal tunnel syndrome) 01/27/2017   DOE (dyspnea on exertion) 08/06/2023   Essential hypertension 09/28/2007   Qualifier: Diagnosis of  By: Mavis MD, Norleen BRAVO    FINGER SUP FB W/O MAJ OPEN WOUND&W/O MENTION INF 02/11/2008   Qualifier: Diagnosis of  By: Mavis MD, Norleen BRAVO    GERD (gastroesophageal reflux disease)    Glaucoma    Hyperlipidemia    Insomnia 06/10/2023   Mild CAD 11/11/2023   Osteoarthritis 09/28/2007   Qualifier: Diagnosis of   By: Mavis MD, Norleen BRAVO     IMO SNOMED Dx Update Oct 2024     Other chronic sinusitis 09/28/2007   Qualifier: Diagnosis of  By: Mavis MD, Norleen BRAVO    PCP NOTES >>>>>> 11/08/2015   PROSTATITIS, ACUTE, CHRONIC 03/26/2009   Qualifier: Diagnosis of  By: Mavis MD, John E    Rib pain on left side 08/13/2022   SCC (squamous cell carcinoma)    pt denies   Venous lake    right inferior clavicle, shave   Vertigo 01/27/2024    Past Surgical History:  Procedure Laterality Date   APPENDECTOMY     CARPAL TUNNEL RELEASE Right 12/23/2022   Procedure: RIGHT CARPAL TUNNEL RELEASE;  Surgeon: Georgina Ozell LABOR, MD;  Location: [facility] OR;  Service: Orthopedics;  Laterality: Right;   CATARACT EXTRACTION Right 06-2015   COLONOSCOPY     POLYPECTOMY     TRANSURETHRAL RESECTION OF PROSTATE     UPPER GASTROINTESTINAL ENDOSCOPY      Review of Systems:    All systems reviewed and negative except where noted in HPI.    Physical Exam:  BP 116/70 (BP Location: Left Arm, Patient Position: Sitting)   Pulse 60   Ht 5' 6 (1.676 m)   Wt  190 lb (86.2 kg)   BMI 30.67 kg/m  No LMP for male patient.  General: Well-nourished, well-developed in no acute distress.  Patient appears younger than stated age.  Walks with no assistive devices.  Is able to get on and off exam table with no assistance. Lungs: Clear to auscultation bilaterally. Non-labored. Heart: Regular rate and rhythm, no murmurs rubs or gallops.  Abdomen: Bowel sounds are normal; Abdomen is Soft; No hepatosplenomegaly, masses or hernias; mild to moderate RUQ abdominal Tenderness; rest of abdomen is not tender.  No guarding or rebound tenderness. Neuro: Alert and oriented x 3.  Grossly intact.  Psych: Alert and  cooperative, normal mood and affect.   Imaging Studies: No results found.  Labs: CBC    Component Value Date/Time   WBC 4.1 08/10/2024 0921   RBC 4.55 08/10/2024 0921   HGB 14.4 08/10/2024 0921   HCT 42.4 08/10/2024 0921   PLT 181.0 08/10/2024 0921   MCV 93.2 08/10/2024 0921   MCH 31.8 01/27/2022 2000   MCHC 34.0 08/10/2024 0921   RDW 13.1 08/10/2024 0921   LYMPHSABS 1.1 08/10/2024 0921   MONOABS 0.5 08/10/2024 0921   EOSABS 0.1 08/10/2024 0921   BASOSABS 0.1 08/10/2024 0921    CMP     Component Value Date/Time   NA 131 (L) 07/15/2024 1331   NA 137 09/29/2023 0833   K 4.5 07/15/2024 1331   CL 96 (L) 07/15/2024 1331   CO2 29 07/15/2024 1331   GLUCOSE 103 (H) 07/15/2024 1331   GLUCOSE 101 (H) 10/05/2006 1439   BUN 13 07/15/2024 1331   BUN 12 09/29/2023 0833   CREATININE 1.05 07/15/2024 1331   CALCIUM 8.9 07/15/2024 1331   PROT 6.6 08/10/2024 0921   PROT 6.5 09/29/2023 0833   ALBUMIN 3.6 08/10/2024 0921   ALBUMIN 4.0 09/29/2023 0833   AST 16 08/10/2024 0921   ALT 11 08/10/2024 0921   ALKPHOS 49 08/10/2024 0921   BILITOT 0.5 08/10/2024 0921   BILITOT 0.8 09/29/2023 0833   GFRNONAA >60 01/27/2022 2000   GFRAA 85 10/24/2008 0000       Assessment and Plan:   Robert Krause is a 89 y.o. y/o male returns for follow-up  of:  Post-Pandrial Nausea, RUQ Pain, Biliary Dyskinesia - Refer to General Surgeon for opinion about Cholecystectomy. - Patient is in very good health for his age.  He can discuss with general surgeon if he is a candidate to pursue gallbladder surgery or not.  Just looking for surgeon's opinion.  Patient understands that he is at an increased risk for surgery given his age. - Scheduling abdominal MRI/MRCP to evaluate bile duct and pancreas.  Chronic Constipation: Improved and Controlled on Miralax - Continue Miralax 1 to 2 capfuls daily before bedtime.  Erosive Gastritis (H. Pylori Negative): Improved / assymptomatic - Continue Pantoprazole  40mg  daily - Continue to avoid NSAIDS.  Low Lipase; Fatty Pancreas - Fecal Pancreatic Elastase: Evaluate for exocrine pancreatic insufficiency. - If fecal pancreatic elastase is low, then I will start him on pancreas enzymes.   [name], PA-C  Follow up in 3 months with TG.

## 2024-09-02 ENCOUNTER — Encounter: Payer: Self-pay | Admitting: Physician Assistant

## 2024-09-07 ENCOUNTER — Other Ambulatory Visit

## 2024-09-07 DIAGNOSIS — Z09 Encounter for follow-up examination after completed treatment for conditions other than malignant neoplasm: Secondary | ICD-10-CM | POA: Diagnosis not present

## 2024-09-08 ENCOUNTER — Ambulatory Visit (HOSPITAL_COMMUNITY)
Admission: RE | Admit: 2024-09-08 | Discharge: 2024-09-08 | Disposition: A | Discharge status: Home / Self Care | Source: Ambulatory Visit | Attending: Physician Assistant | Admitting: Physician Assistant

## 2024-09-08 ENCOUNTER — Other Ambulatory Visit: Payer: Self-pay | Admitting: Physician Assistant

## 2024-09-08 DIAGNOSIS — R1011 Right upper quadrant pain: Secondary | ICD-10-CM

## 2024-09-08 DIAGNOSIS — K8689 Other specified diseases of pancreas: Secondary | ICD-10-CM | POA: Insufficient documentation

## 2024-09-08 DIAGNOSIS — R110 Nausea: Secondary | ICD-10-CM | POA: Diagnosis not present

## 2024-09-08 DIAGNOSIS — N281 Cyst of kidney, acquired: Secondary | ICD-10-CM | POA: Diagnosis not present

## 2024-09-08 MED ORDER — GADOBUTROL 1 MMOL/ML IV SOLN
8.0000 mL | Freq: Once | INTRAVENOUS | Status: AC | PRN
  Administered 2024-09-08: 8 mL via INTRAVENOUS

## 2024-09-09 ENCOUNTER — Ambulatory Visit: Payer: Self-pay | Admitting: Physician Assistant

## 2024-09-09 NOTE — Progress Notes (Signed)
 Patient does not check MyChart.  He prefers a phone call with results. Call and notify patient abdominal MRI/MRCP shows: 1.  No evidence of acute inflammation in the abdomen.  No pancreas inflammation or intestinal inflammation. 2.  Evidence of fatty pancreas with atrophy of the pancreas.  There are no pancreas masses or lesions.  No evidence of cancer or masses in the abdomen.  I recommend low-fat diet. 3.  Normal bile duct.  Normal liver.  No gallstones.  No bile duct stones.   4.  Multiple benign small bilateral kidney cysts.  These are NOT worrisome.  No evidence of kidney cancer.  Refer to urologist if patient has any concerns about the kidney cysts.  Of note, recent lab work showed normal kidney function. 5.  I am still waiting on fecal pancreatic elastase stool test result and we will let him know once received. [name], PA-C

## 2024-09-12 DIAGNOSIS — Z961 Presence of intraocular lens: Secondary | ICD-10-CM | POA: Diagnosis not present

## 2024-09-12 DIAGNOSIS — H401132 Primary open-angle glaucoma, bilateral, moderate stage: Secondary | ICD-10-CM | POA: Diagnosis not present

## 2024-09-15 ENCOUNTER — Ambulatory Visit: Payer: Self-pay | Admitting: Physician Assistant

## 2024-09-15 LAB — PANCREATIC ELASTASE, FECAL: Pancreatic Elastase-1, Stool: 18 ug/g — ABNORMAL LOW (ref >200)

## 2024-09-15 NOTE — Progress Notes (Signed)
 Call and notify patient stool test pancreatic elastase is very low.  This is consistent with exocrine pancreatic insufficiency.   I recommend start Creon pancreas enzymes 36,000 lipase units, take 2 capsules with each meal and 1 with each snack.  Okay to send prescription and give samples if we have it. [name], PA-C

## 2024-09-19 MED ORDER — PANCRELIPASE (LIP-PROT-AMYL) 36000-114000 UNITS PO CPEP: ORAL_CAPSULE | ORAL | 3 refills | Status: AC

## 2024-09-26 ENCOUNTER — Encounter: Payer: Self-pay | Admitting: Radiology

## 2024-09-26 ENCOUNTER — Telehealth: Payer: Self-pay | Admitting: Internal Medicine

## 2024-09-26 NOTE — Telephone Encounter (Signed)
 Pt came in stating he had appt today with pcp. I informed pt before he would let me pull his chart up that pcp is out for surgery. Pt stated that he needed an appt with somebody for pancrease pain. I informed pt, pcp had someone covering for him if wanted to see other provider but pt stated he just needed labs. Pt wanted pcps cma to ocme up front to talk to him and was still mad because cma can't change that pcp is out. I looked at chart for labs and there are [facility] labs but pt wanted pcp to change labs to our office. I let the pt know he can go up stairs to get the labs drawn but he did not want to. Made pt appt with ES.

## 2024-09-28 ENCOUNTER — Telehealth: Payer: Self-pay | Admitting: Pharmacist

## 2024-09-28 NOTE — Progress Notes (Signed)
 Pharmacy Quality Measure Review  This patient is appearing on a report for being at risk of failing the adherence measure for cholesterol (statin) medications this calendar year.   Medication: pravastatin  Last fill date: 04/29/2024 for 90 day supply  Reviewed recent refill history in Dr Annemarie database. Per Epic patient may have 1 refill remaining but per Dr Annemarie database he has no refills remaining. Patient was last seen by PCP 07/15/2024 and has follow up scheduled 11/14/2024.   Spoke with patient today Reviewed medication indication, dosing, and goals of therapy.  and Contacted pharmacy to facilitate refills.  Madelin Ray, PharmD Clinical Pharmacist [facility]

## 2024-09-29 ENCOUNTER — Ambulatory Visit: Admitting: Medical

## 2024-09-29 VITALS — BP 110/70 | HR 56 | Temp 97.9°F | Resp 14 | Ht 66.0 in | Wt 191.4 lb

## 2024-09-29 DIAGNOSIS — K8689 Other specified diseases of pancreas: Secondary | ICD-10-CM | POA: Diagnosis not present

## 2024-09-29 DIAGNOSIS — R739 Hyperglycemia, unspecified: Secondary | ICD-10-CM | POA: Diagnosis not present

## 2024-09-29 DIAGNOSIS — I10 Essential (primary) hypertension: Secondary | ICD-10-CM | POA: Diagnosis not present

## 2024-09-29 DIAGNOSIS — E785 Hyperlipidemia, unspecified: Secondary | ICD-10-CM

## 2024-09-29 MED ORDER — PANTOPRAZOLE SODIUM 40 MG PO TBEC: 40.0000 mg | DELAYED_RELEASE_TABLET | Freq: Every day | ORAL | 3 refills | Status: AC

## 2024-09-29 NOTE — Patient Instructions (Addendum)
 Pancreatic exocrine insufficiency Chronic condition with recent Creon initiationa. No fever or vomiting.  Gastroenterologist follow-up in December. - Continue Creon for enzyme supplementation. - Ordered metabolic panel including liver enzymes and kidney function tests. - Ordered lipid panel including triglycerides. - Refilled pantoprazole  40 mg.  Hyperlipidemia Chronic condition managed with pravastatin . Monitoring needed due to potential triglyceride impact from pancreatic issues. - Ordered lipid panel including triglycerides.  Gerd -refilled pantoprazole  as advised to continue per GI office.  Hyperglycemia Borderline hyperglycemia with previously controlled levels. A1c monitoring required. - Ordered A1c test.  Hypertension Blood pressure 110/70 mmHg. -controlled with avalide.   Follow up date to be determined after lab and imaging review.

## 2024-09-29 NOTE — Progress Notes (Signed)
 Subjective:    Patient ID: Robert Krause, male    DOB: 11-14-1934, 89 y.o.   MRN: [id number]  HPI   Robert Krause is an 89 year old male who presents for routine testing of triglycerides, cholesterol, and A1c.  He has ongoing issues with pancreatic inflammation and experiences problems with pancreatic function. He started taking Creon to supplement pancreatic function four to five days ago but experiences nausea with this medication. No fever or vomiting today. He has not eaten in preparation for the tests.  He has a history of high cholesterol and is currently taking pravastatin  for management. He is concerned about the impact of his pancreatic condition on triglyceride levels.  He reports borderline high blood sugar levels, which is why he is seeking testing for A1c along with triglycerides and cholesterol.  He has undergone multiple CT scans and a fecal exam recently, with consistent findings. He mentions that his gallbladder is also not functioning well.  He previously took Protonix  (pantoprazole ) 40 mg for GERD but has run out of this medication.      Review of Systems  Constitutional:  Negative for chills and fever.  Respiratory:  Negative for chest tightness, shortness of breath and wheezing.   Cardiovascular:  Negative for chest pain and palpitations.  Gastrointestinal:  Negative for abdominal pain, blood in stool, diarrhea and vomiting.       No nausea presently. Avoiding greasy foods.  Genitourinary:  Negative for dysuria, frequency, penile discharge and urgency.  Musculoskeletal:  Negative for back pain and myalgias.  Neurological:  Negative for dizziness and speech difficulty.  Psychiatric/Behavioral:  Negative for behavioral problems and decreased concentration.     Past Medical History:  Diagnosis Date   ACTINIC KERATOSIS, CHEEK, LEFT 08/28/2009   Qualifier: Diagnosis of  By: Mavis MD, John E    Adenomatous polyp of colon 10/2005   ALLERGIC RHINITIS  09/28/2007   Qualifier: Diagnosis of  By: Mavis MD, John E    Allergic rhinitis 09/28/2007   Qualifier: Diagnosis of   By: Mavis MD, Norleen BRAVO     IMO SNOMED Dx Update Oct 2024     Annual physical exam >>>>>>>>>>>>>>>>>>> 07/18/2014   Anxiety    BPH (benign prostatic hyperplasia)    Carpal tunnel syndrome, left upper limb 01/01/2021   Chronic interstitial cystitis    Dr Janit   Chronic nausea 11/12/2020   Colon polyp 04/14/2011   Contracture of palmar fascia 10/16/2010   Qualifier: Diagnosis of  By: Mavis MD, John E    COVID 05/2022   mild   CTS (carpal tunnel syndrome) 01/27/2017   DOE (dyspnea on exertion) 08/06/2023   Essential hypertension 09/28/2007   Qualifier: Diagnosis of  By: Mavis MD, Norleen BRAVO    FINGER SUP FB W/O MAJ OPEN WOUND&W/O MENTION INF 02/11/2008   Qualifier: Diagnosis of  By: Mavis MD, Norleen BRAVO    GERD (gastroesophageal reflux disease)    Glaucoma    Hyperlipidemia    Insomnia 06/10/2023   Mild CAD 11/11/2023   Osteoarthritis 09/28/2007   Qualifier: Diagnosis of   By: Mavis MD, Norleen BRAVO     IMO SNOMED Dx Update Oct 2024     Other chronic sinusitis 09/28/2007   Qualifier: Diagnosis of  By: Mavis MD, Norleen BRAVO    PCP NOTES >>>>>> 11/08/2015   PROSTATITIS, ACUTE, CHRONIC 03/26/2009   Qualifier: Diagnosis of  By: Mavis MD, John E    Rib pain on left side 08/13/2022  SCC (squamous cell carcinoma)    pt denies   [place]    right inferior clavicle, shave   Vertigo 01/27/2024     Social History   Socioeconomic History   Marital status: Married    Spouse name: Not on file   Number of children: 1   Years of education: Not on file   Highest education level: Not on file  Occupational History   Occupation: retired- Sales Executive: RETIRED  Tobacco Use   Smoking status: Never    Passive exposure: Never   Smokeless tobacco: Never  Vaping Use   Vaping status: Never Used  Substance and Sexual Activity   Alcohol use: Yes    Alcohol/week: 3.0  standard drinks of alcohol    Types: 3 Glasses of wine per week    Comment: wine   Drug use: No   Sexual activity: Not on file  Other Topics Concern   Not on file  Social History Narrative   Born in [place], raised in [place], moved [place]  in the 60s   Household: pt and wife   Son lives in the [place]    Social Drivers of Health   Financial Resource Strain: Low Risk  (08/24/2020)   Overall Financial Resource Strain (CARDIA)    Difficulty of Paying Living Expenses: Not very hard  Food Insecurity: Low Risk  (08/25/2023)   Received from [facility]   Hunger Vital Sign    Within the past 12 months, you worried that your food would run out before you got money to buy more: Never true    Within the past 12 months, the food you bought just didn't last and you didn't have money to get more. : Never true  Transportation Needs: No Transportation Needs (08/25/2023)   Received from [facility]    In the past 12 months, has lack of reliable transportation kept you from medical appointments, meetings, work or from getting things needed for daily living? : No  Physical Activity: Not on file  Stress: Not on file  Social Connections: Not on file  Intimate Partner Violence: Not on file    Past Surgical History:  Procedure Laterality Date   APPENDECTOMY     CARPAL TUNNEL RELEASE Right 12/23/2022   Procedure: RIGHT CARPAL TUNNEL RELEASE;  Surgeon: Georgina Ozell LABOR, MD;  Location: [facility] OR;  Service: Orthopedics;  Laterality: Right;   CATARACT EXTRACTION Right 06-2015   COLONOSCOPY     POLYPECTOMY     TRANSURETHRAL RESECTION OF PROSTATE     UPPER GASTROINTESTINAL ENDOSCOPY      Family History  Problem Relation Age of Onset   Anxiety disorder Other    Hyperlipidemia Father    Hypertension Father    CAD Father    Colon cancer Neg Hx    Prostate cancer Neg Hx    Esophageal cancer Neg Hx    Rectal cancer Neg Hx    Stomach cancer Neg Hx     No Known Allergies  Current  Outpatient Medications on File Prior to Visit  Medication Sig Dispense Refill   acetaminophen  (TYLENOL ) 500 MG tablet Take 1 tablet (500 mg total) by mouth every 6 (six) hours as needed for moderate pain or mild pain. 40 tablet 0   ALPRAZolam  (XANAX ) 0.25 MG tablet Take 0.25-0.5 mg by mouth at bedtime as needed for anxiety.     irbesartan -hydrochlorothiazide  (AVALIDE) 150-12.5 MG tablet Take 1 tablet by mouth daily. 90 tablet 1  latanoprost (XALATAN) 0.005 % ophthalmic solution Place 1 drop into both eyes at bedtime.     lipase/protease/amylase (CREON) 36000 UNITS CPEP capsule Take 2 capsules (72,000 Units total) by mouth 3 (three) times daily with meals AND 1 capsule (36,000 Units total) with snacks. 300 capsule 3   meclizine  (ANTIVERT ) 25 MG tablet Take 1 tablet (25 mg total) by mouth 3 (three) times daily as needed for dizziness. 20 tablet 0   ondansetron  (ZOFRAN -ODT) 4 MG disintegrating tablet Take 4 mg by mouth every 8 (eight) hours as needed.     pantoprazole  (PROTONIX ) 40 MG tablet Take 1 tablet (40 mg total) by mouth daily. 90 tablet 3   polyethylene glycol (MIRALAX / GLYCOLAX) 17 g packet Take 17 g by mouth daily.     pravastatin  (PRAVACHOL ) 10 MG tablet Take 1 tablet (10 mg total) by mouth at bedtime. 90 tablet 1   Tamsulosin HCl (FLOMAX) 0.4 MG CAPS Take 0.4 mg by mouth at bedtime.     No current facility-administered medications on file prior to visit.    BP 110/70   Pulse (!) 56   Temp 97.9 F (36.6 C) (Oral)   Resp 14   Ht 5' 6 (1.676 m)   Wt 191 lb 6.4 oz (86.8 kg)   SpO2 99%   BMI 30.89 kg/m           Objective:   Physical Exam  General- No acute distress. Pleasant patient. Neck- Full range of motion, no jvd Lungs- Clear, even and unlabored. Heart- regular rate and rhythm. Neurologic- CNII- XII grossly intact.  Abdomen- soft, +bs, no rebound or guading. Mild rt upper quadrant tender. Back-no cva tenderness      Assessment & Plan:   Patient  Instructions  Pancreatic exocrine insufficiency Chronic condition with recent Creon initiationa. No fever or vomiting.  Gastroenterologist follow-up in December. - Continue Creon for enzyme supplementation. - Ordered metabolic panel including liver enzymes and kidney function tests. - Ordered lipid panel including triglycerides. - Refilled pantoprazole  40 mg.  Hyperlipidemia Chronic condition managed with pravastatin . Monitoring needed due to potential triglyceride impact from pancreatic issues. - Ordered lipid panel including triglycerides.  Gerd -refilled pantoprazole  as advised to continue per GI office.  Hyperglycemia Borderline hyperglycemia with previously controlled levels. A1c monitoring required. - Ordered A1c test.  Hypertension Blood pressure 110/70 mmHg. -controlled with avalide.   Follow up date to be determined after lab and imaging review.    Hadley Detloff, PA-C

## 2024-09-30 LAB — LIPID PANEL
Cholesterol: 141 mg/dL (ref <200)
HDL: 41 mg/dL (ref >=40)
LDL Cholesterol (Calc): 85 mg/dL
Non-HDL Cholesterol (Calc): 100 mg/dL (ref <130)
Total CHOL/HDL Ratio: 3.4 (calc) (ref <5.0)
Triglycerides: 66 mg/dL (ref <150)

## 2024-09-30 LAB — COMPLETE METABOLIC PANEL WITHOUT GFR
AG Ratio: 1.3 (calc) (ref 1.0–2.5)
ALT: 14 U/L (ref 9–46)
AST: 18 U/L (ref 10–35)
Albumin: 3.7 g/dL (ref 3.6–5.1)
Alkaline phosphatase (APISO): 68 U/L (ref 35–144)
BUN: 12 mg/dL (ref 7–25)
CO2: 29 mmol/L (ref 20–32)
Calcium: 8.7 mg/dL (ref 8.6–10.3)
Chloride: 95 mmol/L — ABNORMAL LOW (ref 98–110)
Creat: 1.01 mg/dL (ref 0.70–1.22)
Globulin: 2.8 g/dL (ref 1.9–3.7)
Glucose, Bld: 103 mg/dL — ABNORMAL HIGH (ref 65–99)
Potassium: 4.6 mmol/L (ref 3.5–5.3)
Sodium: 130 mmol/L — ABNORMAL LOW (ref 135–146)
Total Bilirubin: 0.7 mg/dL (ref 0.2–1.2)
Total Protein: 6.5 g/dL (ref 6.1–8.1)

## 2024-09-30 LAB — HEMOGLOBIN A1C
Hgb A1c MFr Bld: 5.6 % (ref <5.7)
Mean Plasma Glucose: 114 mg/dL
eAG (mmol/L): 6.3 mmol/L

## 2024-09-30 NOTE — Telephone Encounter (Signed)
 Will make PCP aware when he returns to office in December.

## 2024-10-01 ENCOUNTER — Ambulatory Visit: Payer: Self-pay | Admitting: Medical

## 2024-10-26 NOTE — Progress Notes (Unsigned)
 Robert Console, PA-C [address] [place], [place]  [address] Phone: [phone]   Primary Care Physician: [name], MD  Primary Gastroenterologist:  Robert Console, PA-C / Dr. Gordy Starch   Chief Complaint: Follow-up chronic nausea, EPI, fatty pancreas, constipation, erosive gastritis.       HPI:   Discussed the use of AI scribe software for clinical note transcription with the patient, who gave verbal consent to proceed.  I last saw patient 09/01/2024 for follow-up of postprandial nausea, RUQ pain, biliary dyskinesia, chronic constipation, erosive gastritis (H. pylori negative), low lipase, and fatty pancreas.  Fecal pancreatic elastase stool test was very low (18).  He was started on Creon  pancreas enzyme 36,000 lipase units 2 with each meal along with each snack.  Continued on MiraLAX for constipation and pantoprazole  for gastritis.  Advised to avoid NSAIDs.  Was referred to general surgeon Dr. Rubin at [facility] to discuss cholecystectomy.  He saw Dr. Rubin 09/28/2024.  Cholecystectomy was not recommend due to lack of anticipated benefit and increased surgical risk given age.  It was thought patient's nausea and vomiting could be due to chronic pancreatitis.  He was encouraged to continue Creon .  Was given prescription for Zofran  to help with nausea.    History of Present Illness   09/08/2024 abdominal MRI/MRCP with and without contrast: 1. No acute inflammatory process identified within the abdomen. 2. Moderate atrophy/fatty replacement of the pancreas. No focal pancreatic lesion. No pancreatic ductal dilation. 3. Multiple bilateral renal cysts. There is a 6 x 8 mm T2 hypointense lesion arising from the left kidney lower pole, which is not well seen on the postcontrast images due to small size. However, the lesion appears hyperintense on T1 weighted images and favored to represent a proteinaceous/hemorrhagic cyst.  07/2024 CT abdomen pelvis with contrast: - No acute  findings within the abdomen or pelvis. - Diffuse fatty replacement of the pancreas with no inflammation.  - Colonic diverticulosis, without radiographic evidence of diverticulitis. - Stable large right scrotal hydrocele.  06/2024 HIDA scan: Borderline low gallbladder ejection fraction 35%.  Limited gallbladder contraction.  EGD 05/15/2023 - Normal esophagus.  - Erosive gastritis with no bleeding and no stigmata of recent bleeding. Biopsies negative for H. Pylori, metaplasia, or dysplasia.  - Normal duodenal bulb and second portion of the duodenum.   EGD Dec 2020 - Normal esophagus.  - Erosive gastropathy with no bleeding and no stigmata of recent bleeding.  - Normal duodenal bulb and second portion of the duodenum.   Colonoscopy 12/2017: 4 small (5 mm to 8 mm) polyps removed.  Pathology showed tubular adenoma and colon mucosal polyps mild diverticulosis.  Otherwise normal.  No repeat due to advanced age.   Current Outpatient Medications  Medication Sig Dispense Refill   acetaminophen  (TYLENOL ) 500 MG tablet Take 1 tablet (500 mg total) by mouth every 6 (six) hours as needed for moderate pain or mild pain. 40 tablet 0   ALPRAZolam  (XANAX ) 0.25 MG tablet Take 0.25-0.5 mg by mouth at bedtime as needed for anxiety.     irbesartan -hydrochlorothiazide  (AVALIDE) 150-12.5 MG tablet Take 1 tablet by mouth daily. 90 tablet 1   latanoprost (XALATAN) 0.005 % ophthalmic solution Place 1 drop into both eyes at bedtime.     lipase/protease/amylase (CREON ) 36000 UNITS CPEP capsule Take 2 capsules (72,000 Units total) by mouth 3 (three) times daily with meals AND 1 capsule (36,000 Units total) with snacks. 300 capsule 3   meclizine  (ANTIVERT )  25 MG tablet Take 1 tablet (25 mg total) by mouth 3 (three) times daily as needed for dizziness. 20 tablet 0   pantoprazole  (PROTONIX ) 40 MG tablet Take 1 tablet (40 mg total) by mouth daily. 90 tablet 3   pantoprazole  (PROTONIX ) 40 MG tablet Take 1 tablet (40 mg  total) by mouth daily. 30 tablet 3   polyethylene glycol (MIRALAX / GLYCOLAX) 17 g packet Take 17 g by mouth daily.     pravastatin  (PRAVACHOL ) 10 MG tablet Take 1 tablet (10 mg total) by mouth at bedtime. 90 tablet 1   Tamsulosin HCl (FLOMAX) 0.4 MG CAPS Take 0.4 mg by mouth at bedtime.     No current facility-administered medications for this visit.    Allergies as of 10/27/2024   (No Known Allergies)    Past Medical History:  Diagnosis Date   ACTINIC KERATOSIS, CHEEK, LEFT 08/28/2009   Qualifier: Diagnosis of  By: Mavis MD, John E    Adenomatous polyp of colon 10/2005   ALLERGIC RHINITIS 09/28/2007   Qualifier: Diagnosis of  By: Mavis MD, John E    Allergic rhinitis 09/28/2007   Qualifier: Diagnosis of   By: Mavis MD, Norleen BRAVO     IMO SNOMED Dx Update Oct 2024     Annual physical exam >>>>>>>>>>>>>>>>>>> 07/18/2014   Anxiety    BPH (benign prostatic hyperplasia)    Carpal tunnel syndrome, left upper limb 01/01/2021   Chronic interstitial cystitis    Dr Janit   Chronic nausea 11/12/2020   Colon polyp 04/14/2011   Contracture of palmar fascia 10/16/2010   Qualifier: Diagnosis of  By: Mavis MD, John E    COVID 05/2022   mild   CTS (carpal tunnel syndrome) 01/27/2017   DOE (dyspnea on exertion) 08/06/2023   Essential hypertension 09/28/2007   Qualifier: Diagnosis of  By: Mavis MD, Norleen BRAVO    FINGER SUP FB W/O MAJ OPEN WOUND&W/O MENTION INF 02/11/2008   Qualifier: Diagnosis of  By: Mavis MD, Norleen BRAVO    GERD (gastroesophageal reflux disease)    Glaucoma    Hyperlipidemia    Insomnia 06/10/2023   Mild CAD 11/11/2023   Osteoarthritis 09/28/2007   Qualifier: Diagnosis of   By: Mavis MD, Norleen BRAVO     IMO SNOMED Dx Update Oct 2024     Other chronic sinusitis 09/28/2007   Qualifier: Diagnosis of  By: Mavis MD, Norleen BRAVO    PCP NOTES >>>>>> 11/08/2015   PROSTATITIS, ACUTE, CHRONIC 03/26/2009   Qualifier: Diagnosis of  By: Mavis MD, John E    Rib pain on left side  08/13/2022   SCC (squamous cell carcinoma)    pt denies   [place]    right inferior clavicle, shave   Vertigo 01/27/2024    Past Surgical History:  Procedure Laterality Date   APPENDECTOMY     CARPAL TUNNEL RELEASE Right 12/23/2022   Procedure: RIGHT CARPAL TUNNEL RELEASE;  Surgeon: Georgina Ozell LABOR, MD;  Location: [facility] OR;  Service: Orthopedics;  Laterality: Right;   CATARACT EXTRACTION Right 06-2015   COLONOSCOPY     POLYPECTOMY     TRANSURETHRAL RESECTION OF PROSTATE     UPPER GASTROINTESTINAL ENDOSCOPY      Review of Systems:    All systems reviewed and negative except where noted in HPI.    Physical Exam:  There were no vitals taken for this visit. No LMP for male patient.  General: Well-nourished, well-developed in no acute distress.  Lungs: Clear  to auscultation bilaterally. Non-labored. Heart: Regular rate and rhythm, no murmurs rubs or gallops.  Abdomen: Bowel sounds are normal; Abdomen is Soft; No hepatosplenomegaly, masses or hernias;  No Abdominal Tenderness; No guarding or rebound tenderness. Neuro: Alert and oriented x 3.  Grossly intact.  Psych: Alert and cooperative, normal mood and affect.   Imaging Studies: No results found.  Labs: CBC    Component Value Date/Time   WBC 4.1 08/10/2024 0921   RBC 4.55 08/10/2024 0921   HGB 14.4 08/10/2024 0921   HCT 42.4 08/10/2024 0921   PLT 181.0 08/10/2024 0921   MCV 93.2 08/10/2024 0921   MCH 31.8 01/27/2022 2000   MCHC 34.0 08/10/2024 0921   RDW 13.1 08/10/2024 0921   LYMPHSABS 1.1 08/10/2024 0921   MONOABS 0.5 08/10/2024 0921   EOSABS 0.1 08/10/2024 0921   BASOSABS 0.1 08/10/2024 0921    CMP     Component Value Date/Time   NA 130 (L) 09/29/2024 0834   NA 137 09/29/2023 0833   K 4.6 09/29/2024 0834   CL 95 (L) 09/29/2024 0834   CO2 29 09/29/2024 0834   GLUCOSE 103 (H) 09/29/2024 0834   GLUCOSE 101 (H) 10/05/2006 1439   BUN 12 09/29/2024 0834   BUN 12 09/29/2023 0833   CREATININE 1.01  09/29/2024 0834   CALCIUM 8.7 09/29/2024 0834   PROT 6.5 09/29/2024 0834   PROT 6.5 09/29/2023 0833   ALBUMIN 3.6 08/10/2024 0921   ALBUMIN 4.0 09/29/2023 0833   AST 18 09/29/2024 0834   ALT 14 09/29/2024 0834   ALKPHOS 49 08/10/2024 0921   BILITOT 0.7 09/29/2024 0834   BILITOT 0.8 09/29/2023 0833   GFRNONAA >60 01/27/2022 2000   GFRAA 85 10/24/2008 0000       Assessment and Plan:   Robert Krause is a 89 y.o. y/o male   1.  Pancreatic insufficiency; atrophic fatty pancreas; chronic pancreatitis: - Continue Creon  36,000 lipase units 2 with each meal along with each snack - Avoid all alcohol  2.  Chronic constipation - Continue MiraLAX  3.  Erosive gastritis (H. pylori negative) - Continue pantoprazole  40 mg daily - Avoid NSAIDs  4.  Chronic postprandial nausea, RUQ pain, borderline biliary dyskinesia.  He underwent surgical evaluation.  Cholecystectomy was not advised due to lack of anticipated benefit and increased surgical risk given age. No evidence of pancreatic cancer based on MRI/MRCP. - Continue Zofran  as needed nausea   Assessment & Plan       Robert Console, PA-C  Follow up as needed.

## 2024-10-27 ENCOUNTER — Other Ambulatory Visit

## 2024-10-27 ENCOUNTER — Encounter: Payer: Self-pay | Admitting: Physician Assistant

## 2024-10-27 ENCOUNTER — Ambulatory Visit: Payer: Self-pay | Admitting: Physician Assistant

## 2024-10-27 ENCOUNTER — Ambulatory Visit: Admitting: Physician Assistant

## 2024-10-27 VITALS — BP 122/62 | HR 58 | Ht 66.0 in | Wt 191.2 lb

## 2024-10-27 DIAGNOSIS — K8681 Exocrine pancreatic insufficiency: Secondary | ICD-10-CM

## 2024-10-27 DIAGNOSIS — R1011 Right upper quadrant pain: Secondary | ICD-10-CM

## 2024-10-27 DIAGNOSIS — E538 Deficiency of other specified B group vitamins: Secondary | ICD-10-CM | POA: Diagnosis not present

## 2024-10-27 DIAGNOSIS — E559 Vitamin D deficiency, unspecified: Secondary | ICD-10-CM | POA: Diagnosis not present

## 2024-10-27 DIAGNOSIS — K8689 Other specified diseases of pancreas: Secondary | ICD-10-CM

## 2024-10-27 DIAGNOSIS — R110 Nausea: Secondary | ICD-10-CM

## 2024-10-27 DIAGNOSIS — K2970 Gastritis, unspecified, without bleeding: Secondary | ICD-10-CM

## 2024-10-27 DIAGNOSIS — K861 Other chronic pancreatitis: Secondary | ICD-10-CM

## 2024-10-27 DIAGNOSIS — K5909 Other constipation: Secondary | ICD-10-CM

## 2024-10-27 DIAGNOSIS — K828 Other specified diseases of gallbladder: Secondary | ICD-10-CM

## 2024-10-27 LAB — VITAMIN B12: Vitamin B-12: 218 pg/mL (ref 211–911)

## 2024-10-27 LAB — VITAMIN D 25 HYDROXY (VIT D DEFICIENCY, FRACTURES): VITD: 26.95 ng/mL — ABNORMAL LOW (ref 30.00–100.00)

## 2024-10-27 MED ORDER — ONDANSETRON HCL 4 MG PO TABS: 4.0000 mg | ORAL_TABLET | Freq: Three times a day (TID) | ORAL | 11 refills | Status: AC

## 2024-10-27 NOTE — Patient Instructions (Addendum)
 Please go to the lab in the basement of our building to have lab work done as you leave today. Hit B for basement when you get on the elevator.  When the doors open the lab is on your left.  We will call you with the results. Thank you.  We have given you samples of the following medication to take: Creon - Continue   We have sent the following medications to your pharmacy for you to pick up at your convenience: Zofran : Take 1 tablet three times a day before meals  Please follow up in 1 year.  Thank you for entrusting me with your care and for choosing [facility], [name], [place]   _______________________________________________________  If your blood pressure at your visit was 140/90 or greater, please contact your primary care physician to follow up on this.  _______________________________________________________  If you are age 59 or older, your body mass index should be between 23-30. Your Body mass index is 30.87 kg/m. If this is out of the aforementioned range listed, please consider follow up with your Primary Care Provider.  If you are age 25 or younger, your body mass index should be between 19-25. Your Body mass index is 30.87 kg/m. If this is out of the aformentioned range listed, please consider follow up with your Primary Care Provider.   ________________________________________________________  The [facility] providers would like to encourage you to use MYCHART to communicate with providers for non-urgent requests or questions.  Due to long hold times on the telephone, sending your provider a message by [facility] may be a faster and more efficient way to get a response.  Please allow 48 business hours for a response.  Please remember that this is for non-urgent requests.  _______________________________________________________  [facility] is using a team-based approach to care.  Your team is made up of your doctor and two to three APPS. Our APPS  (Nurse Practitioners and Physician Assistants) work with your physician to ensure care continuity for you. They are fully qualified to address your health concerns and develop a treatment plan. They communicate directly with your gastroenterologist to care for you. Seeing the Advanced Practice Practitioners on your physician's team can help you by facilitating care more promptly, often allowing for earlier appointments, access to diagnostic testing, procedures, and other specialty referrals.

## 2024-11-11 ENCOUNTER — Other Ambulatory Visit: Payer: Self-pay

## 2024-11-14 ENCOUNTER — Encounter: Payer: Self-pay | Admitting: Internal Medicine

## 2024-11-14 ENCOUNTER — Ambulatory Visit: Admitting: Internal Medicine

## 2024-11-14 VITALS — BP 136/84 | HR 60 | Temp 97.6°F | Resp 18 | Ht 66.0 in | Wt 191.1 lb

## 2024-11-14 DIAGNOSIS — E782 Mixed hyperlipidemia: Secondary | ICD-10-CM | POA: Diagnosis not present

## 2024-11-14 DIAGNOSIS — I10 Essential (primary) hypertension: Secondary | ICD-10-CM | POA: Diagnosis not present

## 2024-11-14 DIAGNOSIS — F419 Anxiety disorder, unspecified: Secondary | ICD-10-CM | POA: Diagnosis not present

## 2024-11-14 MED ORDER — VITAMIN D3 25 MCG (1000 UT) PO CAPS: 2000.0000 [IU] | ORAL_CAPSULE | Freq: Every day | ORAL | Status: AC

## 2024-11-14 MED ORDER — ALPRAZOLAM 0.25 MG PO TABS: 0.2500 mg | ORAL_TABLET | Freq: Every evening | ORAL | 3 refills | Status: AC | PRN

## 2024-11-14 NOTE — Patient Instructions (Signed)
"   go to the front desk for the checkout Please make an appointment for a physical exam March 2026   Continue taking your blood pressure regularly Blood pressure goal:  between 110/65 and  135/85. If it is consistently higher or lower, let me know   VISIT SUMMARY: You came in for a routine check-up. We discussed your chronic pancreatic discomfort, blood pressure, and recent lab results. We also reviewed your current medications and supplements.  YOUR PLAN: CHRONIC GASTROINTESTINAL SYMPTOMS: You have ongoing pancreatic discomfort and nausea, which you are managing with your gastroenterologist. -Continue taking your current medications as advised by your gastroenterologist.  HYPERTENSION: Your blood pressure is well-controlled with readings below 140 mmHg. -Continue your current blood pressure medications.  HYPERLIPIDEMIA: Your LDL cholesterol is slightly above the target level. -Make dietary changes to help lower your LDL cholesterol.  VITAMIN D  DEFICIENCY: Your vitamin D  levels are low. -Increase your vitamin D  supplementation to 2000 IU daily.  GENERAL HEALTH MAINTENANCE: You are up to date with your flu and COVID vaccinations. -Continue with routine vaccinations as recommended.     "

## 2024-11-14 NOTE — Progress Notes (Signed)
 "  Subjective:    Patient ID: Robert Krause, male    DOB: 1934/04/24, 89 y.o.   MRN: [id number]  DOS:  11/14/2024 Follow-up  Discussed the use of AI scribe software for clinical note transcription with the patient, who gave verbal consent to proceed.  History of Present Illness Robert Krause is an 89 year old male who presents for a routine check-up.  GI symptoms- Under gastroenterology care for this condition - Adheres to a low fat, low sugar diet  Blood pressure monitoring - Monitors blood pressure at home - Blood pressure readings vary but remain below 140 mmHg - Blood pressure today is 136/84 mmHg - No chest pain or shortness of breath  Medication and supplement use - Occasionally takes Xanax  at night - Takes vitamin D  800 units daily  Recent laboratory findings - Potassium and liver function tests within normal limits - LDL cholesterol 85 mg/dL - Stable blood sugar levels    Review of Systems See above   Past Medical History:  Diagnosis Date   ACTINIC KERATOSIS, CHEEK, LEFT 08/28/2009   Qualifier: Diagnosis of  By: Mavis MD, John E    Adenomatous polyp of colon 10/2005   ALLERGIC RHINITIS 09/28/2007   Qualifier: Diagnosis of  By: Mavis MD, John E    Allergic rhinitis 09/28/2007   Qualifier: Diagnosis of   By: Mavis MD, Norleen BRAVO     IMO SNOMED Dx Update Oct 2024     Annual physical exam >>>>>>>>>>>>>>>>>>> 07/18/2014   Anxiety    BPH (benign prostatic hyperplasia)    Carpal tunnel syndrome, left upper limb 01/01/2021   Chronic interstitial cystitis    Dr Janit   Chronic nausea 11/12/2020   Colon polyp 04/14/2011   Contracture of palmar fascia 10/16/2010   Qualifier: Diagnosis of  By: Mavis MD, John E    COVID 05/2022   mild   CTS (carpal tunnel syndrome) 01/27/2017   DOE (dyspnea on exertion) 08/06/2023   Essential hypertension 09/28/2007   Qualifier: Diagnosis of  By: Mavis MD, Norleen BRAVO    FINGER SUP FB W/O MAJ OPEN WOUND&W/O MENTION INF  02/11/2008   Qualifier: Diagnosis of  By: Mavis MD, Norleen BRAVO    GERD (gastroesophageal reflux disease)    Glaucoma    Hyperlipidemia    Insomnia 06/10/2023   Mild CAD 11/11/2023   Osteoarthritis 09/28/2007   Qualifier: Diagnosis of   By: Mavis MD, Norleen BRAVO     IMO SNOMED Dx Update Oct 2024     Other chronic sinusitis 09/28/2007   Qualifier: Diagnosis of  By: Mavis MD, Norleen BRAVO    PCP NOTES >>>>>> 11/08/2015   PROSTATITIS, ACUTE, CHRONIC 03/26/2009   Qualifier: Diagnosis of  By: Mavis MD, John E    Rib pain on left side 08/13/2022   SCC (squamous cell carcinoma)    pt denies   [place]    right inferior clavicle, shave   Vertigo 01/27/2024    Past Surgical History:  Procedure Laterality Date   APPENDECTOMY     CARPAL TUNNEL RELEASE Right 12/23/2022   Procedure: RIGHT CARPAL TUNNEL RELEASE;  Surgeon: Georgina Ozell LABOR, MD;  Location: [facility] OR;  Service: Orthopedics;  Laterality: Right;   CATARACT EXTRACTION Right 06-2015   COLONOSCOPY     POLYPECTOMY     TRANSURETHRAL RESECTION OF PROSTATE     UPPER GASTROINTESTINAL ENDOSCOPY      Current Outpatient Medications  Medication Instructions   acetaminophen  (TYLENOL ) 500 mg, Oral, Every 6  hours PRN   ALPRAZolam  (XANAX ) 0.25-0.5 mg, Oral, At bedtime PRN   irbesartan -hydrochlorothiazide  (AVALIDE) 150-12.5 MG tablet 1 tablet, Oral, Daily   latanoprost (XALATAN) 0.005 % ophthalmic solution 1 drop, Daily at bedtime   lipase/protease/amylase (CREON ) 36000 UNITS CPEP capsule Take 2 capsules (72,000 Units total) by mouth 3 (three) times daily with meals AND 1 capsule (36,000 Units total) with snacks.   meclizine  (ANTIVERT ) 25 mg, Oral, 3 times daily PRN   ondansetron  (ZOFRAN ) 4 mg, Oral, 3 times daily before meals   pantoprazole  (PROTONIX ) 40 mg, Oral, Daily   polyethylene glycol (MIRALAX / GLYCOLAX) 17 g, Daily   pravastatin  (PRAVACHOL ) 10 mg, Oral, Daily at bedtime   tamsulosin (FLOMAX) 0.4 mg, Daily at bedtime   Vitamin D3 2,000  Units, Oral, Daily       Objective:   Physical Exam BP 136/84   Pulse 60   Temp 97.6 F (36.4 C) (Oral)   Resp 18   Ht 5' 6 (1.676 m)   Wt 191 lb 2 oz (86.7 kg)   SpO2 97%   BMI 30.85 kg/m  General:   Well developed, NAD, BMI noted. HEENT:  Normocephalic . Face symmetric, atraumatic Lungs:  CTA B Normal respiratory effort, no intercostal retractions, no accessory muscle use. Heart: RRR,  no murmur.  Lower extremities: no pretibial edema bilaterally  Skin: Not pale. Not jaundice Neurologic:  alert & oriented X3.  Speech normal, gait appropriate for age and unassisted Psych--  Cognition and judgment appear intact.  Cooperative with normal attention span and concentration.  Behavior appropriate. No anxious or depressed appearing.      Assessment     Problem list Prediabetes HTN CV: --Bradycardia: Holter 2016 essentially negative --06/07/2019 dx was  DOE and palpitations: ECHO unremarkable, nuclear stress test EF 45%, no ischemic changes -- Coronary CTA (08/2023) : Coronary Ca+ score 65, mild nonobstructive CAD Hyperlipidemia Anxiety: on xanax , stress related to interstitial cystitis, lexapro  intol (-2020) Insomnia  DJD  Glaucoma GU: Dr. Janit --BPH, TURP --Interstitial cystitis --- xanax  prn [place], right inferior clavicle Chronic nausea-constipation: GB U/S 2017 (-),  saw GI, EGD 11-2018: Erosive gastritis, BX with no H. pylori, + reactive gastropathy.   CT abdomen 08-2019: Benign.  EGD 6/ 2024: Erosive gastropathy, BX reactive gastropathy. Vertigo    Assessment & Plan HTN: Blood pressure well-controlled with home readings not exceeding 140 mmHg systolic. - Continue Avalide 150/12.5. Hyperlipidemia LDL cholesterol is 85 mg/dL, slightly above target of 70 mg/dL. Continue Pravachol , watch diet closely, recheck on RTC Vitamin D  deficiency Vitamin D  levels are low.  On a low vitamin D  OTC daily, increased vitamin D  supplementation to 2000 IU  daily. 10/27/2024: Saw GI: -Pancreatic insufficiency, rec to continue Creon . -Chronic constipation: On MiraLAX -Erosive gastritis, on PPIs, avoid NSAIDs. -Chronic postprandial nausea and RUQ pain.  Was evaluated by surgery, no cholecystectomy indicated.  On Zofran  as needed. -GI recheck B12 was okay, vitamin D  low, was rec OTC's. General Health Maintenance UTD vaccines. RTC CPX 3 months      "

## 2024-11-14 NOTE — Assessment & Plan Note (Signed)
 HTN: Blood pressure well-controlled with home readings not exceeding 140 mmHg systolic. - Continue Avalide 150/12.5. Hyperlipidemia LDL cholesterol is 85 mg/dL, slightly above target of 70 mg/dL. Continue Pravachol , watch diet closely, recheck on RTC Vitamin D  deficiency Vitamin D  levels are low.  On a low vitamin D  OTC daily, increased vitamin D  supplementation to 2000 IU daily. 10/27/2024: Saw GI: -Pancreatic insufficiency, rec to continue Creon . -Chronic constipation: On MiraLAX -Erosive gastritis, on PPIs, avoid NSAIDs. -Chronic postprandial nausea and RUQ pain.  Was evaluated by surgery, no cholecystectomy indicated.  On Zofran  as needed. -GI recheck B12 was okay, vitamin D  low, was rec OTC's. General Health Maintenance UTD vaccines. RTC CPX 3 months

## 2024-12-27 ENCOUNTER — Ambulatory Visit: Admitting: Internal Medicine

## 2025-01-25 ENCOUNTER — Ambulatory Visit: Admitting: Internal Medicine

## 2025-02-21 ENCOUNTER — Encounter: Admitting: Internal Medicine
